# Patient Record
Sex: Female | Born: 1970 | Race: Black or African American | Hispanic: No | Marital: Married | Smoking: Former smoker
Health system: Southern US, Community
[De-identification: ages and names within clinical notes are randomized; demographics above are authoritative.]

## PROBLEM LIST (undated history)

## (undated) ENCOUNTER — Ambulatory Visit: Admission: EM | Payer: BLUE CROSS/BLUE SHIELD | Source: Home / Self Care

## (undated) DIAGNOSIS — I2510 Atherosclerotic heart disease of native coronary artery without angina pectoris: Secondary | ICD-10-CM

## (undated) DIAGNOSIS — I219 Acute myocardial infarction, unspecified: Secondary | ICD-10-CM

## (undated) DIAGNOSIS — E119 Type 2 diabetes mellitus without complications: Secondary | ICD-10-CM

## (undated) DIAGNOSIS — E785 Hyperlipidemia, unspecified: Secondary | ICD-10-CM

## (undated) DIAGNOSIS — G629 Polyneuropathy, unspecified: Secondary | ICD-10-CM

## (undated) DIAGNOSIS — N182 Chronic kidney disease, stage 2 (mild): Secondary | ICD-10-CM

## (undated) DIAGNOSIS — G4700 Insomnia, unspecified: Secondary | ICD-10-CM

## (undated) DIAGNOSIS — I10 Essential (primary) hypertension: Secondary | ICD-10-CM

## (undated) DIAGNOSIS — I509 Heart failure, unspecified: Secondary | ICD-10-CM

## (undated) DIAGNOSIS — T883XXA Malignant hyperthermia due to anesthesia, initial encounter: Secondary | ICD-10-CM

## (undated) DIAGNOSIS — R748 Abnormal levels of other serum enzymes: Secondary | ICD-10-CM

## (undated) HISTORY — DX: Hyperlipidemia, unspecified: E78.5

## (undated) HISTORY — DX: Acute myocardial infarction, unspecified: I21.9

## (undated) HISTORY — PX: BACK SURGERY: SHX140

## (undated) HISTORY — PX: CORONARY ANGIOPLASTY WITH STENT PLACEMENT: SHX49

## (undated) HISTORY — PX: OTHER SURGICAL HISTORY: SHX169

## (undated) HISTORY — PX: APPENDECTOMY: SHX54

## (undated) HISTORY — PX: CORONARY STENT PLACEMENT: SHX1402

## (undated) HISTORY — PX: MULTIPLE TOOTH EXTRACTIONS: SHX2053

## (undated) HISTORY — PX: ROTATOR CUFF REPAIR: SHX139

---

## 2015-07-07 DIAGNOSIS — Z955 Presence of coronary angioplasty implant and graft: Secondary | ICD-10-CM | POA: Insufficient documentation

## 2015-11-04 DIAGNOSIS — G5622 Lesion of ulnar nerve, left upper limb: Secondary | ICD-10-CM | POA: Insufficient documentation

## 2015-11-04 DIAGNOSIS — G5603 Carpal tunnel syndrome, bilateral upper limbs: Secondary | ICD-10-CM | POA: Insufficient documentation

## 2015-12-01 DIAGNOSIS — M5020 Other cervical disc displacement, unspecified cervical region: Secondary | ICD-10-CM | POA: Insufficient documentation

## 2015-12-01 DIAGNOSIS — M4802 Spinal stenosis, cervical region: Secondary | ICD-10-CM | POA: Insufficient documentation

## 2016-07-19 DIAGNOSIS — K219 Gastro-esophageal reflux disease without esophagitis: Secondary | ICD-10-CM | POA: Insufficient documentation

## 2016-10-08 DIAGNOSIS — M7501 Adhesive capsulitis of right shoulder: Secondary | ICD-10-CM | POA: Insufficient documentation

## 2016-11-11 DIAGNOSIS — M5030 Other cervical disc degeneration, unspecified cervical region: Secondary | ICD-10-CM

## 2016-11-11 HISTORY — DX: Other cervical disc degeneration, unspecified cervical region: M50.30

## 2017-03-16 DIAGNOSIS — I509 Heart failure, unspecified: Secondary | ICD-10-CM | POA: Insufficient documentation

## 2017-03-18 DIAGNOSIS — E113293 Type 2 diabetes mellitus with mild nonproliferative diabetic retinopathy without macular edema, bilateral: Secondary | ICD-10-CM | POA: Insufficient documentation

## 2017-05-30 DIAGNOSIS — G8929 Other chronic pain: Secondary | ICD-10-CM | POA: Insufficient documentation

## 2017-07-21 DIAGNOSIS — G4733 Obstructive sleep apnea (adult) (pediatric): Secondary | ICD-10-CM | POA: Insufficient documentation

## 2017-07-21 DIAGNOSIS — Z8249 Family history of ischemic heart disease and other diseases of the circulatory system: Secondary | ICD-10-CM | POA: Insufficient documentation

## 2017-08-15 DIAGNOSIS — M5412 Radiculopathy, cervical region: Secondary | ICD-10-CM

## 2017-08-15 HISTORY — DX: Radiculopathy, cervical region: M54.12

## 2017-10-12 DIAGNOSIS — E66812 Obesity, class 2: Secondary | ICD-10-CM | POA: Insufficient documentation

## 2017-10-12 DIAGNOSIS — Z9641 Presence of insulin pump (external) (internal): Secondary | ICD-10-CM | POA: Insufficient documentation

## 2018-02-02 DIAGNOSIS — F418 Other specified anxiety disorders: Secondary | ICD-10-CM | POA: Insufficient documentation

## 2018-02-02 DIAGNOSIS — F5101 Primary insomnia: Secondary | ICD-10-CM | POA: Insufficient documentation

## 2019-03-06 DIAGNOSIS — D259 Leiomyoma of uterus, unspecified: Secondary | ICD-10-CM | POA: Insufficient documentation

## 2019-06-21 DIAGNOSIS — M5416 Radiculopathy, lumbar region: Secondary | ICD-10-CM | POA: Insufficient documentation

## 2019-06-21 HISTORY — DX: Radiculopathy, lumbar region: M54.16

## 2020-01-01 DIAGNOSIS — L409 Psoriasis, unspecified: Secondary | ICD-10-CM | POA: Insufficient documentation

## 2020-11-25 DIAGNOSIS — F411 Generalized anxiety disorder: Secondary | ICD-10-CM | POA: Insufficient documentation

## 2020-11-25 DIAGNOSIS — G8929 Other chronic pain: Secondary | ICD-10-CM | POA: Insufficient documentation

## 2020-12-03 DIAGNOSIS — E559 Vitamin D deficiency, unspecified: Secondary | ICD-10-CM | POA: Insufficient documentation

## 2020-12-03 DIAGNOSIS — E538 Deficiency of other specified B group vitamins: Secondary | ICD-10-CM | POA: Insufficient documentation

## 2020-12-05 ENCOUNTER — Emergency Department (HOSPITAL_BASED_OUTPATIENT_CLINIC_OR_DEPARTMENT_OTHER): Payer: BLUE CROSS/BLUE SHIELD

## 2020-12-05 ENCOUNTER — Other Ambulatory Visit: Payer: Self-pay

## 2020-12-05 ENCOUNTER — Emergency Department (HOSPITAL_BASED_OUTPATIENT_CLINIC_OR_DEPARTMENT_OTHER)
Admission: EM | Admit: 2020-12-05 | Discharge: 2020-12-06 | Disposition: A | Discharge status: Home / Self Care | Payer: BLUE CROSS/BLUE SHIELD | Attending: Emergency Medicine | Admitting: Emergency Medicine

## 2020-12-05 ENCOUNTER — Encounter (HOSPITAL_BASED_OUTPATIENT_CLINIC_OR_DEPARTMENT_OTHER): Payer: Self-pay | Admitting: *Deleted

## 2020-12-05 DIAGNOSIS — Z87891 Personal history of nicotine dependence: Secondary | ICD-10-CM | POA: Diagnosis not present

## 2020-12-05 DIAGNOSIS — R197 Diarrhea, unspecified: Secondary | ICD-10-CM | POA: Insufficient documentation

## 2020-12-05 DIAGNOSIS — I2510 Atherosclerotic heart disease of native coronary artery without angina pectoris: Secondary | ICD-10-CM | POA: Insufficient documentation

## 2020-12-05 DIAGNOSIS — R1032 Left lower quadrant pain: Secondary | ICD-10-CM | POA: Insufficient documentation

## 2020-12-05 DIAGNOSIS — E119 Type 2 diabetes mellitus without complications: Secondary | ICD-10-CM | POA: Diagnosis not present

## 2020-12-05 DIAGNOSIS — R630 Anorexia: Secondary | ICD-10-CM | POA: Diagnosis not present

## 2020-12-05 DIAGNOSIS — R110 Nausea: Secondary | ICD-10-CM | POA: Diagnosis not present

## 2020-12-05 DIAGNOSIS — I10 Essential (primary) hypertension: Secondary | ICD-10-CM | POA: Insufficient documentation

## 2020-12-05 DIAGNOSIS — K529 Noninfective gastroenteritis and colitis, unspecified: Secondary | ICD-10-CM

## 2020-12-05 HISTORY — DX: Atherosclerotic heart disease of native coronary artery without angina pectoris: I25.10

## 2020-12-05 HISTORY — DX: Polyneuropathy, unspecified: G62.9

## 2020-12-05 HISTORY — DX: Type 2 diabetes mellitus without complications: E11.9

## 2020-12-05 HISTORY — DX: Essential (primary) hypertension: I10

## 2020-12-05 LAB — CBC WITH DIFFERENTIAL/PLATELET
Abs Immature Granulocytes: 0.07 10*3/uL (ref 0.00–0.07)
Basophils Absolute: 0.1 10*3/uL (ref 0.0–0.1)
Basophils Relative: 1 %
Eosinophils Absolute: 0.1 10*3/uL (ref 0.0–0.5)
Eosinophils Relative: 2 %
HCT: 41.1 % (ref 36.0–46.0)
Hemoglobin: 13.9 g/dL (ref 12.0–15.0)
Immature Granulocytes: 1 %
Lymphocytes Relative: 26 %
Lymphs Abs: 2.5 10*3/uL (ref 0.7–4.0)
MCH: 27.6 pg (ref 26.0–34.0)
MCHC: 33.8 g/dL (ref 30.0–36.0)
MCV: 81.7 fL (ref 80.0–100.0)
Monocytes Absolute: 0.6 10*3/uL (ref 0.1–1.0)
Monocytes Relative: 6 %
Neutro Abs: 6.3 10*3/uL (ref 1.7–7.7)
Neutrophils Relative %: 64 %
Platelets: 225 10*3/uL (ref 150–400)
RBC: 5.03 MIL/uL (ref 3.87–5.11)
RDW: 14.2 % (ref 11.5–15.5)
WBC: 9.6 10*3/uL (ref 4.0–10.5)
nRBC: 0 % (ref 0.0–0.2)

## 2020-12-05 LAB — COMPREHENSIVE METABOLIC PANEL
ALT: 40 U/L (ref 0–44)
AST: 51 U/L — ABNORMAL HIGH (ref 15–41)
Albumin: 3.7 g/dL (ref 3.5–5.0)
Alkaline Phosphatase: 61 U/L (ref 38–126)
Anion gap: 8 (ref 5–15)
BUN: 16 mg/dL (ref 6–20)
CO2: 25 mmol/L (ref 22–32)
Calcium: 9.1 mg/dL (ref 8.9–10.3)
Chloride: 102 mmol/L (ref 98–111)
Creatinine, Ser: 1.33 mg/dL — ABNORMAL HIGH (ref 0.44–1.00)
GFR, Estimated: 48 mL/min — ABNORMAL LOW (ref >60)
Glucose, Bld: 190 mg/dL — ABNORMAL HIGH (ref 70–99)
Potassium: 3.6 mmol/L (ref 3.5–5.1)
Sodium: 135 mmol/L (ref 135–145)
Total Bilirubin: 0.4 mg/dL (ref 0.3–1.2)
Total Protein: 7.4 g/dL (ref 6.5–8.1)

## 2020-12-05 LAB — I-STAT VENOUS BLOOD GAS, ED
Acid-Base Excess: 3 mmol/L — ABNORMAL HIGH (ref 0.0–2.0)
Bicarbonate: 28.7 mmol/L — ABNORMAL HIGH (ref 20.0–28.0)
Calcium, Ion: 1.21 mmol/L (ref 1.15–1.40)
HCT: 42 % (ref 36.0–46.0)
Hemoglobin: 14.3 g/dL (ref 12.0–15.0)
O2 Saturation: 34 %
Patient temperature: 98.1
Potassium: 5.3 mmol/L — ABNORMAL HIGH (ref 3.5–5.1)
Sodium: 134 mmol/L — ABNORMAL LOW (ref 135–145)
TCO2: 30 mmol/L (ref 22–32)
pCO2, Ven: 46.9 mmHg (ref 44.0–60.0)
pH, Ven: 7.394 (ref 7.250–7.430)
pO2, Ven: 21 mmHg — CL (ref 32.0–45.0)

## 2020-12-05 LAB — CBG MONITORING, ED: Glucose-Capillary: 210 mg/dL — ABNORMAL HIGH (ref 70–99)

## 2020-12-05 MED ORDER — LACTATED RINGERS IV BOLUS
1000.0000 mL | Freq: Once | INTRAVENOUS | Status: AC
  Administered 2020-12-05: 1000 mL via INTRAVENOUS

## 2020-12-05 MED ORDER — IOHEXOL 300 MG/ML  SOLN
100.0000 mL | Freq: Once | INTRAMUSCULAR | Status: AC | PRN
  Administered 2020-12-05: 100 mL via INTRAVENOUS

## 2020-12-05 MED ORDER — ONDANSETRON HCL 4 MG/2ML IJ SOLN
4.0000 mg | Freq: Once | INTRAMUSCULAR | Status: AC
  Administered 2020-12-05: 4 mg via INTRAVENOUS
  Filled 2020-12-05: qty 2

## 2020-12-05 MED ORDER — MORPHINE SULFATE (PF) 4 MG/ML IV SOLN
4.0000 mg | Freq: Once | INTRAVENOUS | Status: AC
Start: 2020-12-05 — End: 2020-12-05
  Administered 2020-12-05: 4 mg via INTRAVENOUS
  Filled 2020-12-05: qty 1

## 2020-12-05 NOTE — ED Triage Notes (Addendum)
C/o abd  cramping and pain and diarrhea  after starting sulfa abx wed. Blood sugar is elevated . Pt reports 278

## 2020-12-05 NOTE — ED Provider Notes (Signed)
[facility] Provider Note   CSN: [id number] Arrival date & time: 12/05/20  1953     History Chief Complaint  Patient presents with   Abdominal Pain    Jean Calderon is a 50 y.o. female.  The history is provided by the patient.  Abdominal Pain Pain location:  LLQ Pain quality: aching, cramping and gnawing   Pain radiates to:  Does not radiate Pain severity:  Severe Onset quality:  Gradual Duration:  12 hours Timing:  Constant Progression:  Unchanged Chronicity:  New Context comment:  Patient reports on Wednesday she started taking Bactrim, metronidazole and Diflucan and today around noon she started having left lower quadrant abdominal pain and multiple episodes of diarrhea Relieved by:  None tried Worsened by:  Eating Ineffective treatments:  None tried Associated symptoms: anorexia, diarrhea and nausea   Associated symptoms: no cough, no dysuria, no fever, no shortness of breath, no vaginal discharge and no vomiting   Risk factors comment:  Was recently started on 2 antibiotics and Diflucan.  Prior history of diabetes and DKA     Past Medical History:  Diagnosis Date   CAD (coronary artery disease)    DM (diabetes mellitus) ([place])    HTN (hypertension)    Neuropathy     There are no problems to display for this patient.   Past Surgical History:  Procedure Laterality Date   APPENDECTOMY     BACK SURGERY     CESAREAN SECTION     CORONARY ANGIOPLASTY WITH STENT PLACEMENT     ROTATOR CUFF REPAIR       OB History   No obstetric history on file.     No family history on file.  Social History   Tobacco Use   Smoking status: Former    Types: Cigarettes  Substance Use Topics   Alcohol use: Not Currently    Home Medications Prior to Admission medications   Not on File    Allergies    Nsaids and Ibuprofen  Review of Systems   Review of Systems  Constitutional:  Negative for fever.  Respiratory:  Negative for cough  and shortness of breath.   Gastrointestinal:  Positive for abdominal pain, anorexia, diarrhea and nausea. Negative for vomiting.  Genitourinary:  Negative for dysuria and vaginal discharge.  All other systems reviewed and are negative.  Physical Exam Updated Vital Signs BP (!) 116/59   Pulse 93   Temp 98.6 F (37 C) (Oral)   Resp 16   Ht 5\' 9"  (1.753 m)   Wt 131.1 kg   SpO2 99%   BMI 42.68 kg/m   Physical Exam Vitals and nursing note reviewed.  Constitutional:      General: She is not in acute distress.    Appearance: She is well-developed.  HENT:     Head: Normocephalic and atraumatic.     Mouth/Throat:     Mouth: Mucous membranes are dry.  Eyes:     Pupils: Pupils are equal, round, and reactive to light.  Cardiovascular:     Rate and Rhythm: Normal rate and regular rhythm.     Heart sounds: Normal heart sounds. No murmur heard.   No friction rub.  Pulmonary:     Effort: Pulmonary effort is normal.     Breath sounds: Normal breath sounds. No wheezing or rales.  Abdominal:     General: Bowel sounds are normal. There is no distension.     Palpations: Abdomen is soft.  Tenderness: There is abdominal tenderness in the left lower quadrant. There is guarding. There is no right CVA tenderness, left CVA tenderness or rebound.  Musculoskeletal:        General: No tenderness. Normal range of motion.     Cervical back: Normal range of motion and neck supple.     Right lower leg: No edema.     Left lower leg: No edema.     Comments: No edema  Skin:    General: Skin is warm and dry.     Findings: No rash.  Neurological:     General: No focal deficit present.     Mental Status: She is alert and oriented to person, place, and time. Mental status is at baseline.     Cranial Nerves: No cranial nerve deficit.  Psychiatric:        Mood and Affect: Mood normal.        Behavior: Behavior normal.    ED Results / Procedures / Treatments   Labs (all labs ordered are listed,  but only abnormal results are displayed) Labs Reviewed  CBC WITH DIFFERENTIAL/PLATELET  COMPREHENSIVE METABOLIC PANEL  I-STAT VENOUS BLOOD GAS, ED    EKG None  Radiology No results found.  Procedures Procedures   Medications Ordered in ED Medications  lactated ringers bolus 1,000 mL (has no administration in time range)  ondansetron (ZOFRAN) injection 4 mg (has no administration in time range)  morphine 4 MG/ML injection 4 mg (has no administration in time range)    ED Course  I have reviewed the triage vital signs and the nursing notes.  Pertinent labs & imaging results that were available during my care of the patient were reviewed by me and considered in my medical decision making (see chart for details).    MDM Rules/Calculators/A&P                           Patient is a diabetic female with history of hypertension and CAD who is presenting today with left lower quadrant pain, multiple episodes of diarrhea and nausea.  She did start Bactrim, metronidazole and Diflucan on Wednesday which she reported was for a vaginal infection but unclear what the Bactrim was for specifically as it was not mentioned in her doctor's note.  She denies having any urinary symptoms and reported she did not have vaginal discharge at the time.  She has had poor oral intake since noon given her symptoms and reports her blood sugar has been in the 400s.  She has been in DKA in the past and was concerned that that is what was happening.  No recent abdominal surgeries.  She did have shoulder surgery 1 month ago and reports that has been slow to heal but no fever, cough, congestion or new shortness of breath.  Patient has stable vital signs on exam.  She does have left lower quadrant pain and concern for possible diverticulitis versus colitis and dehydration.  Also concern for possible electrolyte abnormality.  Patient given IV fluids.  Labs and imaging are pending.  Final Clinical Impression(s) / ED  Diagnoses Final diagnoses:  None    Rx / DC Orders ED Discharge Orders     None        Blanchie Dessert, MD 12/05/20 2322

## 2020-12-05 NOTE — ED Notes (Signed)
Multible efforts to establish IV access by 3 different people w/o success. Dr. Theora Gianotti aware.

## 2020-12-05 NOTE — ED Provider Notes (Signed)
Nursing notes and vitals signs, including pulse oximetry, reviewed.  Summary of this visit's results, reviewed by myself:  EKG:  EKG Interpretation  Date/Time:    Ventricular Rate:    PR Interval:    QRS Duration:   QT Interval:    QTC Calculation:   R Axis:     Text Interpretation:          Labs:  Results for orders placed or performed during the hospital encounter of 12/05/20 (from the past 24 hour(s))  CBC with Differential/Platelet     Status: None   Collection Time: 12/05/20  8:58 PM  Result Value Ref Range   WBC 9.6 4.0 - 10.5 K/uL   RBC 5.03 3.87 - 5.11 MIL/uL   Hemoglobin 13.9 12.0 - 15.0 g/dL   HCT 41.1 36.0 - 46.0 %   MCV 81.7 80.0 - 100.0 fL   MCH 27.6 26.0 - 34.0 pg   MCHC 33.8 30.0 - 36.0 g/dL   RDW 14.2 11.5 - 15.5 %   Platelets 225 150 - 400 K/uL   nRBC 0.0 0.0 - 0.2 %   Neutrophils Relative % 64 %   Neutro Abs 6.3 1.7 - 7.7 K/uL   Lymphocytes Relative 26 %   Lymphs Abs 2.5 0.7 - 4.0 K/uL   Monocytes Relative 6 %   Monocytes Absolute 0.6 0.1 - 1.0 K/uL   Eosinophils Relative 2 %   Eosinophils Absolute 0.1 0.0 - 0.5 K/uL   Basophils Relative 1 %   Basophils Absolute 0.1 0.0 - 0.1 K/uL   Immature Granulocytes 1 %   Abs Immature Granulocytes 0.07 0.00 - 0.07 K/uL  Comprehensive metabolic panel     Status: Abnormal   Collection Time: 12/05/20  8:58 PM  Result Value Ref Range   Sodium 135 135 - 145 mmol/L   Potassium 3.6 3.5 - 5.1 mmol/L   Chloride 102 98 - 111 mmol/L   CO2 25 22 - 32 mmol/L   Glucose, Bld 190 (H) 70 - 99 mg/dL   BUN 16 6 - 20 mg/dL   Creatinine, Ser 1.33 (H) 0.44 - 1.00 mg/dL   Calcium 9.1 8.9 - 10.3 mg/dL   Total Protein 7.4 6.5 - 8.1 g/dL   Albumin 3.7 3.5 - 5.0 g/dL   AST 51 (H) 15 - 41 U/L   ALT 40 0 - 44 U/L   Alkaline Phosphatase 61 38 - 126 U/L   Total Bilirubin 0.4 0.3 - 1.2 mg/dL   GFR, Estimated 48 (L) >60 mL/min   Anion gap 8 5 - 15  CBG monitoring, ED     Status: Abnormal   Collection Time: 12/05/20  9:30 PM   Result Value Ref Range   Glucose-Capillary 210 (H) 70 - 99 mg/dL  I-Stat venous blood gas, [facility] ED)     Status: Abnormal   Collection Time: 12/05/20 10:02 PM  Result Value Ref Range   pH, Ven 7.394 7.250 - 7.430   pCO2, Ven 46.9 44.0 - 60.0 mmHg   pO2, Ven 21.0 (LL) 32.0 - 45.0 mmHg   Bicarbonate 28.7 (H) 20.0 - 28.0 mmol/L   TCO2 30 22 - 32 mmol/L   O2 Saturation 34.0 %   Acid-Base Excess 3.0 (H) 0.0 - 2.0 mmol/L   Sodium 134 (L) 135 - 145 mmol/L   Potassium 5.3 (H) 3.5 - 5.1 mmol/L   Calcium, Ion 1.21 1.15 - 1.40 mmol/L   HCT 42.0 36.0 - 46.0 %   Hemoglobin 14.3 12.0 -  15.0 g/dL   Patient temperature 98.1 F    Sample type VENOUS    Comment NOTIFIED PHYSICIAN   Urinalysis, Routine w reflex microscopic Urine, Clean Catch     Status: Abnormal   Collection Time: 12/05/20 11:57 PM  Result Value Ref Range   Color, Urine YELLOW YELLOW   APPearance CLEAR CLEAR   Specific Gravity, Urine 1.010 1.005 - 1.030   pH 6.0 5.0 - 8.0   Glucose, UA >=500 (A) NEGATIVE mg/dL   Hgb urine dipstick NEGATIVE NEGATIVE   Bilirubin Urine NEGATIVE NEGATIVE   Ketones, ur NEGATIVE NEGATIVE mg/dL   Protein, ur NEGATIVE NEGATIVE mg/dL   Nitrite NEGATIVE NEGATIVE   Leukocytes,Ua NEGATIVE NEGATIVE  Urinalysis, Microscopic (reflex)     Status: Abnormal   Collection Time: 12/05/20 11:57 PM  Result Value Ref Range   RBC / HPF 0-5 0 - 5 RBC/hpf   WBC, UA 0-5 0 - 5 WBC/hpf   Bacteria, UA RARE (A) NONE SEEN   Squamous Epithelial / LPF 0-5 0 - 5    Imaging Studies: CT ABDOMEN PELVIS W CONTRAST  Result Date: 12/05/2020 CLINICAL DATA:  Diverticulitis suspected. Abdominal pain and cramping and diarrhea after starting antibiotics. EXAM: CT ABDOMEN AND PELVIS WITH CONTRAST TECHNIQUE: Multidetector CT imaging of the abdomen and pelvis was performed using the standard protocol following bolus administration of intravenous contrast. CONTRAST:  189mL OMNIPAQUE IOHEXOL 300 MG/ML  SOLN COMPARISON:  None. FINDINGS: Lower  chest: The lung bases are clear. Hepatobiliary: Diffuse fatty infiltration of the liver. No focal liver lesions. Gallbladder and bile ducts are unremarkable. Pancreas: Unremarkable. No pancreatic ductal dilatation or surrounding inflammatory changes. Spleen: Normal in size without focal abnormality. Adrenals/Urinary Tract: Adrenal glands are unremarkable. Kidneys are normal, without renal calculi, focal lesion, or hydronephrosis. Bladder is unremarkable. Stomach/Bowel: Stomach, small bowel, and colon are not abnormally distended. Under distention limits evaluation but there appears to be wall thickening of the transverse colon. Although this could be artifact of under distension, this could indicate a focal colitis. No abscess or collection is identified. No significant diverticular disease. Appendix is surgically absent. Vascular/Lymphatic: No significant vascular findings are present. No enlarged abdominal or pelvic lymph nodes. Reproductive: Uterus and ovaries are not enlarged. Other: No free air or free fluid in the abdomen. Abdominal wall musculature appears intact. Musculoskeletal: Postoperative changes with posterior fixation of L4-5. Normal alignment of the lumbar spine. No destructive bone lesions. IMPRESSION: 1. Wall thickening suggested in the transverse colon possibly indicating focal colitis. Follow-up after resolution of acute process is recommended to exclude underlying neoplastic lesion. No evidence of obstruction. 2. Diffuse fatty infiltration of the liver. 3. Postoperative changes in the lower lumbar spine. Electronically Signed   By: Lucienne Capers M.D.   On: 12/05/2020 23:40    The patient's urinalysis is normal and is unclear why she was placed on Bactrim.  We will have her discontinue the Bactrim.  We will have her continue the Flagyl and add Cipro for possible colitis.  She did have a colonoscopy last year which she states was normal.     Kolter Reaver, Jenny Reichmann, MD 12/06/20 [phone]

## 2020-12-06 LAB — URINALYSIS, ROUTINE W REFLEX MICROSCOPIC
Bilirubin Urine: NEGATIVE
Glucose, UA: >=500 mg/dL — AB
Hgb urine dipstick: NEGATIVE
Ketones, ur: NEGATIVE mg/dL
Leukocytes,Ua: NEGATIVE
Nitrite: NEGATIVE
Protein, ur: NEGATIVE mg/dL
Specific Gravity, Urine: 1.01 (ref 1.005–1.030)
pH: 6 (ref 5.0–8.0)

## 2020-12-06 LAB — URINALYSIS, MICROSCOPIC (REFLEX)

## 2020-12-06 MED ORDER — HYDROCODONE-ACETAMINOPHEN 5-325 MG PO TABS: 1.0000 | ORAL_TABLET | Freq: Four times a day (QID) | ORAL | 0 refills | Status: DC | PRN

## 2020-12-06 MED ORDER — METRONIDAZOLE 500 MG PO TABS: 500.0000 mg | ORAL_TABLET | Freq: Three times a day (TID) | ORAL | 0 refills | Status: DC

## 2020-12-06 MED ORDER — CIPROFLOXACIN HCL 500 MG PO TABS: 500.0000 mg | ORAL_TABLET | Freq: Two times a day (BID) | ORAL | 0 refills | Status: DC

## 2020-12-06 NOTE — Discharge Instructions (Addendum)
Please discontinue your Bactrim/Septra (trimethoprim/sulfamethoxazole).  We will have you continue Flagyl although in a slightly different dosing schedule.  We will also add Cipro.  These are for the treatment of colitis, a possible infection of your colon.  If you worsen please contact your PCP or return to the emergency department.

## 2020-12-19 ENCOUNTER — Emergency Department (HOSPITAL_BASED_OUTPATIENT_CLINIC_OR_DEPARTMENT_OTHER)
Admission: EM | Admit: 2020-12-19 | Discharge: 2020-12-19 | Disposition: A | Discharge status: Home / Self Care | Payer: BLUE CROSS/BLUE SHIELD | Attending: Emergency Medicine | Admitting: Emergency Medicine

## 2020-12-19 ENCOUNTER — Other Ambulatory Visit: Payer: Self-pay

## 2020-12-19 ENCOUNTER — Emergency Department (HOSPITAL_BASED_OUTPATIENT_CLINIC_OR_DEPARTMENT_OTHER): Payer: BLUE CROSS/BLUE SHIELD

## 2020-12-19 ENCOUNTER — Encounter (HOSPITAL_BASED_OUTPATIENT_CLINIC_OR_DEPARTMENT_OTHER): Payer: Self-pay

## 2020-12-19 DIAGNOSIS — I2510 Atherosclerotic heart disease of native coronary artery without angina pectoris: Secondary | ICD-10-CM | POA: Diagnosis not present

## 2020-12-19 DIAGNOSIS — Z7902 Long term (current) use of antithrombotics/antiplatelets: Secondary | ICD-10-CM | POA: Insufficient documentation

## 2020-12-19 DIAGNOSIS — Z794 Long term (current) use of insulin: Secondary | ICD-10-CM | POA: Diagnosis not present

## 2020-12-19 DIAGNOSIS — Z87891 Personal history of nicotine dependence: Secondary | ICD-10-CM | POA: Diagnosis not present

## 2020-12-19 DIAGNOSIS — M25512 Pain in left shoulder: Secondary | ICD-10-CM | POA: Diagnosis present

## 2020-12-19 DIAGNOSIS — Z951 Presence of aortocoronary bypass graft: Secondary | ICD-10-CM | POA: Diagnosis not present

## 2020-12-19 DIAGNOSIS — E1140 Type 2 diabetes mellitus with diabetic neuropathy, unspecified: Secondary | ICD-10-CM | POA: Diagnosis not present

## 2020-12-19 DIAGNOSIS — I10 Essential (primary) hypertension: Secondary | ICD-10-CM | POA: Insufficient documentation

## 2020-12-19 DIAGNOSIS — Z79899 Other long term (current) drug therapy: Secondary | ICD-10-CM | POA: Insufficient documentation

## 2020-12-19 MED ORDER — METHOCARBAMOL 500 MG PO TABS: 500.0000 mg | ORAL_TABLET | Freq: Two times a day (BID) | ORAL | 0 refills | Status: DC

## 2020-12-19 NOTE — ED Triage Notes (Signed)
Pt c/o left shoulder pain-states she ha surgery to left shoulder ~57month ago in [place]-seen by local ortho 2 days ago-NAD-steady gait

## 2020-12-19 NOTE — ED Provider Notes (Signed)
[facility] Provider Note   CSN: [id number] Arrival date & time: 12/19/20  1509     History Chief Complaint  Patient presents with   Shoulder Pain    Jean Calderon is a 50 y.o. female.  HPI Patient is a 50 year old female with past medical history significant for CAD, DM2, HTN, neuropathy  Patient is presented to the ER today she states that she had a left shoulder bone spur removal 1 month ago for shoulder pain that has been ongoing for some time.  She states that she has had pain since the surgery but felt that 1 week ago her pain was worse.  She was seen by her orthopedist and told that this was expected she came to the ER due to continued pain.  She is currently taking Flexeril and Tylenol for pain  She states that the pain is achy constant 10/10 worse with movement of her shoulder and when she reaches overhead.  Denies any fevers chills rashes lightheadedness dizziness cough congestion no chest pain or shortness of breath no nausea or vomiting.     Past Medical History:  Diagnosis Date   CAD (coronary artery disease)    DM (diabetes mellitus) ([place])    HTN (hypertension)    Neuropathy     There are no problems to display for this patient.   Past Surgical History:  Procedure Laterality Date   APPENDECTOMY     BACK SURGERY     CESAREAN SECTION     CORONARY ANGIOPLASTY WITH STENT PLACEMENT     ROTATOR CUFF REPAIR       OB History   No obstetric history on file.     No family history on file.  Social History   Tobacco Use   Smoking status: Former    Types: Cigarettes   Smokeless tobacco: Never  Vaping Use   Vaping Use: Never used  Substance Use Topics   Alcohol use: Not Currently    Home Medications Prior to Admission medications   Medication Sig Start Date End Date Taking? Authorizing Provider  atorvastatin (LIPITOR) 40 MG tablet Take 1 tablet by mouth daily. 09/01/20  Yes [provider]  Calcium  Carbonate-Vitamin D (OYSTER SHELL CALCIUM/D) 500-5 MG-MCG TABS Take by mouth. 12/03/20  Yes [provider]  hydrOXYzine (ATARAX/VISTARIL) 10 MG tablet Take one by mouth once daily at bedtime as needed for itching 07/16/20  Yes [provider]  insulin lispro (HUMALOG) 100 UNIT/ML injection Take 200 units Via insulin pump every 3 days 10/11/19  Yes [provider]  isosorbide mononitrate (IMDUR) 30 MG 24 hr tablet Take 1 tablet by mouth daily. 11/25/20  Yes [provider]  lisinopril-hydrochlorothiazide (ZESTORETIC) 20-12.5 MG tablet Take 1 tablet by mouth daily. 11/25/20  Yes [provider]  LORazepam (ATIVAN) 0.5 MG tablet Take by mouth. 06/18/20  Yes [provider]  methocarbamol (ROBAXIN) 500 MG tablet Take 1 tablet (500 mg total) by mouth 2 (two) times daily. 12/19/20  Yes Oluwadamilola Deliz S, PA  tizanidine (ZANAFLEX) 6 MG capsule Take by mouth. 12/03/20  Yes [provider]  traMADol (ULTRAM) 50 MG tablet Take by mouth. 11/20/20  Yes [provider]  venlafaxine XR (EFFEXOR-XR) 75 MG 24 hr capsule Take by mouth. 06/25/20  Yes [provider]  albuterol (VENTOLIN HFA) 108 (90 Base) MCG/ACT inhaler SMARTSIG:2 Puff(s) By Mouth Every 4 Hours PRN 10/01/20   [provider]  buPROPion (WELLBUTRIN XL) 150 MG 24 hr  tablet Take 150 mg by mouth daily. 12/04/20   [provider]  ciprofloxacin (CIPRO) 500 MG tablet Take 1 tablet (500 mg total) by mouth 2 (two) times daily. One po bid x 7 days 12/06/20   Molpus, John, MD  clopidogrel (PLAVIX) 75 MG tablet Take 75 mg by mouth daily. 11/25/20   [provider]  cyclobenzaprine (FLEXERIL) 10 MG tablet Take 10 mg by mouth 3 (three) times daily as needed. 11/26/20   [provider]  gabapentin (NEURONTIN) 100 MG capsule Take by mouth. 08/05/20   [provider]  HYDROcodone-acetaminophen (NORCO) 5-325 MG tablet Take 1 tablet by mouth every 6 (six) hours  as needed for severe pain. 12/06/20   Molpus, John, MD  JARDIANCE 25 MG TABS tablet Take 25 mg by mouth daily. 12/14/20   [provider]  lisinopril-hydrochlorothiazide (ZESTORETIC) 20-12.5 MG tablet Take 1 tablet by mouth daily. 12/04/20   [provider]  metoprolol succinate (TOPROL-XL) 100 MG 24 hr tablet Take 100 mg by mouth daily. 12/04/20   [provider]  metroNIDAZOLE (FLAGYL) 500 MG tablet Take 1 tablet (500 mg total) by mouth 3 (three) times daily. One po bid x 7 days 12/06/20   Molpus, John, MD  oxyCODONE-acetaminophen (PERCOCET/ROXICET) 5-325 MG tablet Take by mouth. 10/16/20   [provider]  pregabalin (LYRICA) 150 MG capsule Take 150 mg by mouth 2 (two) times daily. 08/14/20   [provider]  promethazine-dextromethorphan (PROMETHAZINE-DM) 6.25-15 MG/5ML syrup Take 5 mLs by mouth every 6 (six) hours. 09/20/20   [provider]  topiramate (TOPAMAX) 25 MG tablet Take 25 mg by mouth 2 (two) times daily. 10/08/20   [provider]  zolpidem (AMBIEN) 5 MG tablet Take 5-10 mg by mouth at bedtime. 12/14/20   [provider]    Allergies    Nsaids and Ibuprofen  Review of Systems   Review of Systems  Constitutional:  Negative for chills and fever.  HENT:  Negative for congestion.   Eyes:  Negative for pain.  Respiratory:  Negative for cough and shortness of breath.   Cardiovascular:  Negative for chest pain and leg swelling.  Gastrointestinal:  Negative for abdominal pain and vomiting.  Genitourinary:  Negative for dysuria.  Musculoskeletal:  Negative for myalgias.       Left shoulder pain  Skin:  Negative for rash.  Neurological:  Negative for dizziness and headaches.   Physical Exam Updated Vital Signs BP 105/70 (BP Location: Right Arm)   Pulse 75   Temp 97.8 F (36.6 C) (Oral)   Resp 18   Ht 5\' 9"  (1.753 m)   Wt 125.2 kg   SpO2 99%   BMI 40.76 kg/m   Physical Exam Vitals and nursing note  reviewed.  Constitutional:      General: She is not in acute distress. HENT:     Head: Normocephalic and atraumatic.     Nose: Nose normal.  Eyes:     General: No scleral icterus. Cardiovascular:     Rate and Rhythm: Normal rate and regular rhythm.     Pulses: Normal pulses.     Heart sounds: Normal heart sounds.     Comments: Radial artery pulses 3+ and symmetric Pulmonary:     Effort: Pulmonary effort is normal. No respiratory distress.     Breath sounds: No wheezing.  Musculoskeletal:     Cervical back: Normal range of motion.     Right lower leg: No edema.  Left lower leg: No edema.     Comments: Tenderness to palpation of left deltoid there is a small well-healing scar with no evidence of infection consistent with arthroscopy  Full range of motion passively of left shoulder no redness or swelling of the left shoulder.  Passive range of motion to 90 degrees flexion and 90 degrees abduction however limited beyond this due to pain.  Skin:    General: Skin is warm and dry.     Capillary Refill: Capillary refill takes less than 2 seconds. Cap refill in fingers of left hand Neurological:     Mental Status: She is alert. Mental status is at baseline.     Comments: Sensation intact in all 4 extremities  Psychiatric:        Mood and Affect: Mood normal.        Behavior: Behavior normal.    ED Results / Procedures / Treatments   Labs (all labs ordered are listed, but only abnormal results are displayed) Labs Reviewed - No data to display  EKG None  Radiology DG Shoulder Left  Result Date: 12/19/2020 CLINICAL DATA:  Left shoulder pain, fell 1 week ago EXAM: LEFT SHOULDER - 2+ VIEW COMPARISON:  None. FINDINGS: Frontal, transscapular, and axillary views of the left shoulder are obtained. No fracture, subluxation, or dislocation. 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 Moderate hypertrophic changes are seen at the acromioclavicular joint. Visualized portions of the left  chest are clear. IMPRESSION: 1. Osteoarthritis.  No acute displaced fracture. Electronically Signed   By: Randa Ngo M.D.   On: 12/19/2020 16:42    Procedures Procedures   Medications Ordered in ED Medications - No data to display  ED Course  I have reviewed the triage vital signs and the nursing notes.  Pertinent labs & imaging results that were available during my care of the patient were reviewed by me and considered in my medical decision making (see chart for details).    MDM Rules/Calculators/A&P                          Patient is 50 year old female 1 month out since shoulder arthroscopy and bone spur removal has had ongoing pain seems a worse over the past week worse with movement reproduces pain with movement and palpation  Is distally neurovascularly intact has good pulses bilaterally in the wrists and good movement does not have any evidence of septic joint with good movement without pain up until 90 degrees of flexion and 90 degrees of abduction. No systemic symptoms no chest pain doubt ACS, septic joint, bursitis  Requesting analgesics here in the ER.  Seems that she has muscle relaxers at home she is using Flexeril does cause some sedation we will prescribe her Robaxin to use instead.  X-ray personally reviewed given that she did have a fall 2 weeks ago she states where she fell onto her left shoulder denies any other areas of pain.  X-ray reviewed and negative for fracture.  We will discharge patient home at this time with follow-up with her orthopedic surgeon.  Final Clinical Impression(s) / ED Diagnoses Final diagnoses:  Left shoulder pain, unspecified chronicity    Rx / DC Orders ED Discharge Orders          Ordered    methocarbamol (ROBAXIN) 500 MG tablet  2 times daily        12/19/20 1637             [place], [employer]  S, PA 12/19/20 [place], DO 12/19/20 2001

## 2020-12-19 NOTE — Discharge Instructions (Signed)
Your x-ray is negative for any fractures.  Please follow-up with your orthopedic doctor.  Please continue to do range of motion exercises you may try out the other muscle relaxer I have prescribed you instead of tizanidine  Please use Tylenol or ibuprofen for pain.  You may use 600 mg ibuprofen every 6 hours or 1000 mg of Tylenol every 6 hours.  You may choose to alternate between the 2.  This would be most effective.  Not to exceed 4 g of Tylenol within 24 hours.  Not to exceed 3200 mg ibuprofen 24 hours.   If you are unable to take ibuprofen you may apply topical Voltaren gel to skin instead.

## 2021-01-06 ENCOUNTER — Encounter (HOSPITAL_COMMUNITY): Payer: Self-pay | Admitting: Radiology

## 2021-01-06 DIAGNOSIS — I38 Endocarditis, valve unspecified: Secondary | ICD-10-CM | POA: Insufficient documentation

## 2021-01-14 ENCOUNTER — Emergency Department (HOSPITAL_BASED_OUTPATIENT_CLINIC_OR_DEPARTMENT_OTHER)
Admission: EM | Admit: 2021-01-14 | Discharge: 2021-01-14 | Disposition: A | Discharge status: Home / Self Care | Payer: BLUE CROSS/BLUE SHIELD | Attending: Emergency Medicine | Admitting: Emergency Medicine

## 2021-01-14 ENCOUNTER — Other Ambulatory Visit: Payer: Self-pay

## 2021-01-14 ENCOUNTER — Encounter (HOSPITAL_BASED_OUTPATIENT_CLINIC_OR_DEPARTMENT_OTHER): Payer: Self-pay | Admitting: Emergency Medicine

## 2021-01-14 ENCOUNTER — Emergency Department (HOSPITAL_BASED_OUTPATIENT_CLINIC_OR_DEPARTMENT_OTHER): Payer: BLUE CROSS/BLUE SHIELD

## 2021-01-14 DIAGNOSIS — Z7984 Long term (current) use of oral hypoglycemic drugs: Secondary | ICD-10-CM | POA: Diagnosis not present

## 2021-01-14 DIAGNOSIS — Z7902 Long term (current) use of antithrombotics/antiplatelets: Secondary | ICD-10-CM | POA: Insufficient documentation

## 2021-01-14 DIAGNOSIS — Z794 Long term (current) use of insulin: Secondary | ICD-10-CM | POA: Insufficient documentation

## 2021-01-14 DIAGNOSIS — I10 Essential (primary) hypertension: Secondary | ICD-10-CM | POA: Diagnosis not present

## 2021-01-14 DIAGNOSIS — R0789 Other chest pain: Secondary | ICD-10-CM | POA: Diagnosis not present

## 2021-01-14 DIAGNOSIS — Z87891 Personal history of nicotine dependence: Secondary | ICD-10-CM | POA: Insufficient documentation

## 2021-01-14 DIAGNOSIS — I2510 Atherosclerotic heart disease of native coronary artery without angina pectoris: Secondary | ICD-10-CM | POA: Diagnosis not present

## 2021-01-14 DIAGNOSIS — Z79899 Other long term (current) drug therapy: Secondary | ICD-10-CM | POA: Insufficient documentation

## 2021-01-14 DIAGNOSIS — E1165 Type 2 diabetes mellitus with hyperglycemia: Secondary | ICD-10-CM | POA: Insufficient documentation

## 2021-01-14 DIAGNOSIS — R739 Hyperglycemia, unspecified: Secondary | ICD-10-CM

## 2021-01-14 LAB — CBC
HCT: 44.7 % (ref 36.0–46.0)
Hemoglobin: 14.4 g/dL (ref 12.0–15.0)
MCH: 27.2 pg (ref 26.0–34.0)
MCHC: 32.2 g/dL (ref 30.0–36.0)
MCV: 84.3 fL (ref 80.0–100.0)
Platelets: 222 10*3/uL (ref 150–400)
RBC: 5.3 MIL/uL — ABNORMAL HIGH (ref 3.87–5.11)
RDW: 13.9 % (ref 11.5–15.5)
WBC: 7 10*3/uL (ref 4.0–10.5)
nRBC: 0 % (ref 0.0–0.2)

## 2021-01-14 LAB — URINALYSIS, ROUTINE W REFLEX MICROSCOPIC
Bilirubin Urine: NEGATIVE
Glucose, UA: >=500 mg/dL — AB
Hgb urine dipstick: NEGATIVE
Ketones, ur: NEGATIVE mg/dL
Leukocytes,Ua: NEGATIVE
Nitrite: NEGATIVE
Protein, ur: NEGATIVE mg/dL
Specific Gravity, Urine: 1.01 (ref 1.005–1.030)
pH: 5.5 (ref 5.0–8.0)

## 2021-01-14 LAB — URINALYSIS, MICROSCOPIC (REFLEX)

## 2021-01-14 LAB — PREGNANCY, URINE: Preg Test, Ur: NEGATIVE

## 2021-01-14 LAB — LIPASE, BLOOD: Lipase: 35 U/L (ref 11–51)

## 2021-01-14 LAB — CBG MONITORING, ED: Glucose-Capillary: 199 mg/dL — ABNORMAL HIGH (ref 70–99)

## 2021-01-14 LAB — BASIC METABOLIC PANEL
Anion gap: 11 (ref 5–15)
BUN: 15 mg/dL (ref 6–20)
CO2: 22 mmol/L (ref 22–32)
Calcium: 9.4 mg/dL (ref 8.9–10.3)
Chloride: 101 mmol/L (ref 98–111)
Creatinine, Ser: 1.03 mg/dL — ABNORMAL HIGH (ref 0.44–1.00)
GFR, Estimated: >60 mL/min (ref >60)
Glucose, Bld: 205 mg/dL — ABNORMAL HIGH (ref 70–99)
Potassium: 3.4 mmol/L — ABNORMAL LOW (ref 3.5–5.1)
Sodium: 134 mmol/L — ABNORMAL LOW (ref 135–145)

## 2021-01-14 LAB — TROPONIN I (HIGH SENSITIVITY)
Troponin I (High Sensitivity): 2 ng/L (ref <18)
Troponin I (High Sensitivity): 2 ng/L (ref <18)

## 2021-01-14 MED ORDER — POTASSIUM CHLORIDE CRYS ER 20 MEQ PO TBCR
20.0000 meq | EXTENDED_RELEASE_TABLET | Freq: Once | ORAL | Status: AC
  Administered 2021-01-14: 20 meq via ORAL
  Filled 2021-01-14: qty 1

## 2021-01-14 MED ORDER — DIPHENHYDRAMINE HCL 50 MG/ML IJ SOLN
12.5000 mg | Freq: Once | INTRAMUSCULAR | Status: AC
  Administered 2021-01-14: 12.5 mg via INTRAVENOUS
  Filled 2021-01-14: qty 1

## 2021-01-14 MED ORDER — LACTATED RINGERS IV BOLUS
1500.0000 mL | Freq: Once | INTRAVENOUS | Status: AC
  Administered 2021-01-14: 1500 mL via INTRAVENOUS

## 2021-01-14 NOTE — Discharge Instructions (Signed)
Please continue to monitor your symptoms closely.  If you develop any new or worsening symptoms please come back to the emergency department.  It was a pleasure to meet you.

## 2021-01-14 NOTE — ED Notes (Signed)
Attempted to draw repeat troponin off IV and straight stick but was unsuccessful

## 2021-01-14 NOTE — ED Provider Notes (Signed)
[facility] EMERGENC[facility] DEPARTMENT Provider Note   CSN: [id number] Arrival date & time: 01/14/21  2130     History Chief Complaint  Patient presents with   Chest Pain   Hyperglycemia   Arm Pain    Jean Calderon is a 50 y.o. female.  HPI  Patient is a 50 year old female with a history of diabetes mellitus on insulin pump, hypertension, CAD status post stenting, who presents to the emergency department due to fatigue, intermittent shortness of breath, as well as left-sided chest discomfort.  Symptoms started about 2 days ago.  States that her chest discomfort is a dull pain and is nonradiating.  States that it will typically last about 10 to 15 minutes and spontaneously resolved.  It sometimes improves with movement as well as when drinking ginger ale.  She reports associated decreased appetite.  No vomiting.  She states she saw her PCP yesterday and had a negative respiratory panel at that visit.  No abdominal pain.  Reports polyuria as well as polydipsia and states that her blood glucose has been running in the 300s at times.   Past Medical History:  Diagnosis Date   CAD (coronary artery disease)    DM (diabetes mellitus) ([place])    HTN (hypertension)    Neuropathy    There are no problems to display for this patient.   Past Surgical History:  Procedure Laterality Date   APPENDECTOMY     BACK SURGERY     CESAREAN SECTION     CORONARY ANGIOPLASTY WITH STENT PLACEMENT     ROTATOR CUFF REPAIR       OB History   No obstetric history on file.     No family history on file.  Social History   Tobacco Use   Smoking status: Former    Types: Cigarettes   Smokeless tobacco: Never  Vaping Use   Vaping Use: Never used  Substance Use Topics   Alcohol use: Not Currently    Home Medications Prior to Admission medications   Medication Sig Start Date End Date Taking? Authorizing Provider  albuterol (VENTOLIN HFA) 108 (90 Base) MCG/ACT inhaler SMARTSIG:2 Puff(s) By  Mouth Every 4 Hours PRN 10/01/20   [provider]  amoxicillin-clavulanate (AUGMENTIN) 875-125 MG tablet Take 1 tablet by mouth 2 (two) times daily. 01/13/21   [provider]  atorvastatin (LIPITOR) 40 MG tablet Take 1 tablet by mouth daily. 09/01/20   [provider]  buPROPion (WELLBUTRIN XL) 150 MG 24 hr tablet Take 150 mg by mouth daily. 12/04/20   [provider]  Calcium Carbonate-Vitamin D (OYSTER SHELL CALCIUM/D) 500-5 MG-MCG TABS Take by mouth. 12/03/20   [provider]  cefUROXime (CEFTIN) 500 MG tablet Take 500 mg by mouth 2 (two) times daily. 11/08/20   [provider]  Ciclopirox 0.77 % gel Apply topically 2 (two) times daily. 09/25/20   [provider]  cloNIDine (CATAPRES) 0.1 MG tablet Take 0.1 mg by mouth 2 (two) times daily. 01/08/21   [provider]  clopidogrel (PLAVIX) 75 MG tablet Take 75 mg by mouth daily. 11/25/20   [provider]  Continuous Blood Gluc Sensor (DEXCOM G6 SENSOR) MISC by Misc.(Non-Drug; Combo Route) route    [provider]  cyclobenzaprine (FLEXERIL) 10 MG tablet Take 10 mg by mouth 3 (three) times daily as needed. 11/26/20   [provider]  Estradiol-Norethindrone Acet 0.5-0.1 MG tablet Take 1 tablet by mouth daily. 12/18/20   [provider]  gabapentin (  NEURONTIN) 100 MG capsule Take by mouth. 08/05/20   [provider]  HYDROcodone-acetaminophen (NORCO) 5-325 MG tablet Take 1 tablet by mouth every 6 (six) hours as needed for severe pain. 12/06/20   Molpus, John, MD  hydrOXYzine (ATARAX/VISTARIL) 10 MG tablet Take one by mouth once daily at bedtime as needed for itching 07/16/20   [provider]  Insulin Disposable Pump (OMNIPOD 5 G6 POD, GEN 5,) MISC SMARTSIG:SUB-Q Every Other Day 12/02/20   [provider]  insulin lispro (HUMALOG) 100 UNIT/ML injection Take 200 units Via insulin pump every 3 days 10/11/19   [provider]  isosorbide mononitrate (IMDUR) 30 MG 24 hr tablet Take 1 tablet by mouth daily. 11/25/20   [provider]  JARDIANCE 25 MG TABS tablet Take 25 mg by mouth daily. 12/14/20   [provider]  lisinopril-hydrochlorothiazide (ZESTORETIC) 20-12.5 MG tablet Take 1 tablet by mouth daily. 12/04/20   [provider]  lisinopril-hydrochlorothiazide (ZESTORETIC) 20-12.5 MG tablet Take 1 tablet by mouth daily. 11/25/20   [provider]  LORazepam (ATIVAN) 0.5 MG tablet Take by mouth. 06/18/20   [provider]  methocarbamol (ROBAXIN) 500 MG tablet Take 1 tablet (500 mg total) by mouth 2 (two) times daily. 12/19/20   Tedd Sias, PA  metoprolol succinate (TOPROL-XL) 100 MG 24 hr tablet Take 100 mg by mouth daily. 12/04/20   [provider]  metroNIDAZOLE (FLAGYL) 500 MG tablet Take 1 tablet (500 mg total) by mouth 3 (three) times daily. One po bid x 7 days 12/06/20   Molpus, John, MD  mirtazapine (REMERON) 45 MG tablet Take 45 mg by mouth at bedtime. 12/18/20   [provider]  nitroGLYCERIN (NITROSTAT) 0.4 MG SL tablet SMARTSIG:1 Tablet(s) Sublingual PRN 01/05/21   [provider]  oxyCODONE-acetaminophen (PERCOCET/ROXICET) 5-325 MG tablet Take by mouth. 10/16/20   [provider]  pregabalin (LYRICA) 150 MG capsule Take 150 mg by mouth 2 (two) times daily. 08/14/20   [provider]  tizanidine (ZANAFLEX) 6 MG capsule Take by mouth. 12/03/20   [provider]  topiramate (TOPAMAX) 25 MG tablet Take 25 mg by mouth 2 (two) times daily. 10/08/20   [provider]  traMADol Veatrice Bourbon) 50 MG tablet Take by mouth. 11/20/20   [provider]  Venlafaxine HCl 225 MG TB24 Take 1 tablet by mouth daily. 12/22/20   [provider]  venlafaxine XR (EFFEXOR-XR) 75 MG 24 hr capsule Take by mouth. 06/25/20   [provider]  zolpidem (AMBIEN) 5 MG tablet Take 5-10 mg by mouth at bedtime.  12/14/20   [provider]    Allergies    Sulfamethoxazole-trimethoprim, Nsaids, and Ibuprofen  Review of Systems   Review of Systems  All other systems reviewed and are negative. Ten systems reviewed and are negative for acute change, except as noted in the HPI.   Physical Exam Updated Vital Signs BP (!) 104/56   Pulse 70   Temp 97.9 F (36.6 C)   Resp 17   Ht 5\' 9"  (1.753 m)   Wt 128.8 kg   SpO2 99%   BMI 41.94 kg/m   Physical Exam Vitals and nursing note reviewed.  Constitutional:      General: She is not in acute distress.    Appearance: Normal appearance. She is well-developed. She is not ill-appearing, toxic-appearing or diaphoretic.  HENT:     Head: Normocephalic and atraumatic.     Right Ear: External ear normal.  Left Ear: External ear normal.     Nose: Nose normal.     Mouth/Throat:     Mouth: Mucous membranes are moist.     Pharynx: Oropharynx is clear. No oropharyngeal exudate or posterior oropharyngeal erythema.  Eyes:     Extraocular Movements: Extraocular movements intact.  Cardiovascular:     Rate and Rhythm: Normal rate and regular rhythm.     Pulses: Normal pulses.          Radial pulses are 2+ on the right side and 2+ on the left side.       Dorsalis pedis pulses are 2+ on the right side and 2+ on the left side.     Heart sounds: Normal heart sounds. Heart sounds not distant. No murmur heard. No systolic murmur is present.  No diastolic murmur is present.    No friction rub. No gallop. No S3 or S4 sounds.  Pulmonary:     Effort: Pulmonary effort is normal. No tachypnea, accessory muscle usage or respiratory distress.     Breath sounds: Normal breath sounds. No stridor. No decreased breath sounds, wheezing, rhonchi or rales.  Abdominal:     General: Abdomen is flat.     Tenderness: There is no abdominal tenderness.  Musculoskeletal:        General: Normal range of motion.     Cervical back: Normal range of motion and neck supple.  No tenderness.     Right lower leg: No tenderness. No edema.     Left lower leg: No tenderness. No edema.     Comments: No pedal edema noted.  Skin:    General: Skin is warm and dry.  Neurological:     General: No focal deficit present.     Mental Status: She is alert and oriented to person, place, and time.  Psychiatric:        Mood and Affect: Mood normal.        Behavior: Behavior normal.    ED Results / Procedures / Treatments   Labs (all labs ordered are listed, but only abnormal results are displayed) Labs Reviewed  BASIC METABOLIC PANEL - Abnormal; Notable for the following components:      Result Value   Sodium 134 (*)    Potassium 3.4 (*)    Glucose, Bld 205 (*)    Creatinine, Ser 1.03 (*)    All other components within normal limits  CBC - Abnormal; Notable for the following components:   RBC 5.30 (*)    All other components within normal limits  URINALYSIS, ROUTINE W REFLEX MICROSCOPIC - Abnormal; Notable for the following components:   Glucose, UA >=500 (*)    All other components within normal limits  URINALYSIS, MICROSCOPIC (REFLEX) - Abnormal; Notable for the following components:   Bacteria, UA FEW (*)    All other components within normal limits  CBG MONITORING, ED - Abnormal; Notable for the following components:   Glucose-Capillary 199 (*)    All other components within normal limits  PREGNANCY, URINE  LIPASE, BLOOD  TROPONIN I (HIGH SENSITIVITY)  TROPONIN I (HIGH SENSITIVITY)   EKG EKG Interpretation  Date/Time:  Wednesday January 14 2021 09:54:07 EST Ventricular Rate:  71 PR Interval:  183 QRS Duration: 107 QT Interval:  391 QTC Calculation: 425 R Axis:   73 Text Interpretation: Sinus rhythm Low voltage, precordial leads Confirmed by Regan Lemming ([id number]) on 01/14/2021 2:09:59 PM  Radiology DG Chest 2 View  Result Date: 01/14/2021 CLINICAL DATA:  Left arm and chest pain over the last 2 days. EXAM: CHEST - 2 VIEW COMPARISON:  None.  FINDINGS: The heart size and mediastinal contours are within normal limits. Both lungs are clear. The visualized skeletal structures are unremarkable. IMPRESSION: No active cardiopulmonary disease. Electronically Signed   By: Nelson Chimes M.D.   On: 01/14/2021 10:52    Procedures Procedures   Medications Ordered in ED Medications  lactated ringers bolus 1,500 mL (0 mLs Intravenous Stopped 01/14/21 1252)  diphenhydrAMINE (BENADRYL) injection 12.5 mg (12.5 mg Intravenous Given 01/14/21 1135)  potassium chloride SA (KLOR-CON) CR tablet 20 mEq (20 mEq Oral Given 01/14/21 1410)    ED Course  I have reviewed the triage vital signs and the nursing notes.  Pertinent labs & imaging results that were available during my care of the patient were reviewed by me and considered in my medical decision making (see chart for details).    MDM Rules/Calculators/A&P                          Pt is a 50 y.o. female who presents to the ED due to malaise, intermittent shortness of breath, as well as left-sided chest discomfort.  Labs: CBC with RBCs of 5.3. BMP with a sodium of 134, potassium of 3.4, glucose of 205, creatinine 1.03. Lipase of 35. Pregnancy test is negative. UA with greater than 500 glucose and few bacteria. Troponin of 2 with a repeat of 2.  Imaging: Chest x-ray shows no active cardiopulmonary disease.  I, Rayna Sexton, PA-C, personally reviewed and evaluated these images and lab results as part of my medical decision-making.  Unsure the source of the patient's symptoms.  She had a negative COVID/flu test yesterday.  She does have a history of CAD and notes some left-sided chest pain for the past 2 days in addition to her other prior mentioned symptoms.  She had reassuring troponins, ECG, and chest x-ray.  Doubt ACS at this time.  She does note that her chest pain was improving when drinking ginger ale and also notes some intermittent upper abdominal pain.  None currently.  Lipase  within normal limits at 35.  Doubt pancreatitis.  Patient mildly hypokalemic at 3.4.  This was repleted with Klor-Con.  Glucose elevated at 205.  Normal anion gap of 11.  No urine ketones.  Doubt DKA at this time.  Patient treated with Benadryl as well as 1.5 L of lactated Ringer's.  She notes significant improvement in her symptoms.  Feel that she is stable for discharge at this time and she is agreeable.  We discussed return precautions at length.  Recommended PCP follow-up this week.  Her questions were answered and she was amicable at the time of discharge.  Note: Portions of this report may have been transcribed using voice recognition software. Every effort was made to ensure accuracy; however, inadvertent computerized transcription errors may be present.   Final Clinical Impression(s) / ED Diagnoses Final diagnoses:  Hyperglycemia  Atypical chest pain   Rx / DC Orders ED Discharge Orders     None        Rayna Sexton, PA-C 01/14/21 1415    Regan Lemming, MD 01/14/21 1454

## 2021-01-14 NOTE — ED Triage Notes (Addendum)
Pt having left arm pain to upper left chest discomfort and hyperglycemia for two days.  Occasional sob.  Tested yesterday for covid/flu which was negative.  Pt is on insulin pump.  She feels like she is dehydrated and has been itching.  Pt has appointment with endocrinologist in [place] soon as she states she just moved here.

## 2021-01-26 ENCOUNTER — Encounter (HOSPITAL_BASED_OUTPATIENT_CLINIC_OR_DEPARTMENT_OTHER): Payer: Self-pay | Admitting: Urology

## 2021-01-26 ENCOUNTER — Other Ambulatory Visit: Payer: Self-pay

## 2021-01-26 ENCOUNTER — Emergency Department (HOSPITAL_BASED_OUTPATIENT_CLINIC_OR_DEPARTMENT_OTHER): Payer: BLUE CROSS/BLUE SHIELD

## 2021-01-26 ENCOUNTER — Emergency Department (HOSPITAL_BASED_OUTPATIENT_CLINIC_OR_DEPARTMENT_OTHER)
Admission: EM | Admit: 2021-01-26 | Discharge: 2021-01-26 | Disposition: A | Discharge status: Home / Self Care | Payer: BLUE CROSS/BLUE SHIELD | Attending: Emergency Medical Services | Admitting: Emergency Medical Services

## 2021-01-26 DIAGNOSIS — M25512 Pain in left shoulder: Secondary | ICD-10-CM | POA: Insufficient documentation

## 2021-01-26 DIAGNOSIS — Z5321 Procedure and treatment not carried out due to patient leaving prior to being seen by health care provider: Secondary | ICD-10-CM | POA: Diagnosis not present

## 2021-01-26 NOTE — ED Triage Notes (Signed)
Left shoulder pain x 2 days  Limited ROM  Sx on same shoulder x 2 months ago

## 2021-01-26 NOTE — ED Notes (Signed)
Per registration, pt left.  

## 2021-03-05 DIAGNOSIS — M19012 Primary osteoarthritis, left shoulder: Secondary | ICD-10-CM | POA: Insufficient documentation

## 2021-03-05 HISTORY — DX: Primary osteoarthritis, left shoulder: M19.012

## 2021-03-09 ENCOUNTER — Emergency Department (HOSPITAL_BASED_OUTPATIENT_CLINIC_OR_DEPARTMENT_OTHER)
Admission: EM | Admit: 2021-03-09 | Discharge: 2021-03-09 | Disposition: A | Discharge status: Home / Self Care | Payer: 59 | Attending: Emergency Medicine | Admitting: Emergency Medicine

## 2021-03-09 ENCOUNTER — Other Ambulatory Visit: Payer: Self-pay

## 2021-03-09 ENCOUNTER — Emergency Department (HOSPITAL_BASED_OUTPATIENT_CLINIC_OR_DEPARTMENT_OTHER): Payer: 59

## 2021-03-09 ENCOUNTER — Encounter (HOSPITAL_BASED_OUTPATIENT_CLINIC_OR_DEPARTMENT_OTHER): Payer: Self-pay | Admitting: *Deleted

## 2021-03-09 DIAGNOSIS — R0602 Shortness of breath: Secondary | ICD-10-CM | POA: Diagnosis not present

## 2021-03-09 DIAGNOSIS — R0789 Other chest pain: Secondary | ICD-10-CM | POA: Diagnosis not present

## 2021-03-09 DIAGNOSIS — R110 Nausea: Secondary | ICD-10-CM | POA: Insufficient documentation

## 2021-03-09 DIAGNOSIS — Z5321 Procedure and treatment not carried out due to patient leaving prior to being seen by health care provider: Secondary | ICD-10-CM | POA: Insufficient documentation

## 2021-03-09 LAB — BASIC METABOLIC PANEL
Anion gap: 12 (ref 5–15)
BUN: 13 mg/dL (ref 6–20)
CO2: 21 mmol/L — ABNORMAL LOW (ref 22–32)
Calcium: 9.3 mg/dL (ref 8.9–10.3)
Chloride: 102 mmol/L (ref 98–111)
Creatinine, Ser: 1.01 mg/dL — ABNORMAL HIGH (ref 0.44–1.00)
GFR, Estimated: >60 mL/min (ref >60)
Glucose, Bld: 281 mg/dL — ABNORMAL HIGH (ref 70–99)
Potassium: 4.2 mmol/L (ref 3.5–5.1)
Sodium: 135 mmol/L (ref 135–145)

## 2021-03-09 LAB — CBC
HCT: 43.5 % (ref 36.0–46.0)
Hemoglobin: 14.5 g/dL (ref 12.0–15.0)
MCH: 27.4 pg (ref 26.0–34.0)
MCHC: 33.3 g/dL (ref 30.0–36.0)
MCV: 82.2 fL (ref 80.0–100.0)
Platelets: 226 10*3/uL (ref 150–400)
RBC: 5.29 MIL/uL — ABNORMAL HIGH (ref 3.87–5.11)
RDW: 13.4 % (ref 11.5–15.5)
WBC: 6.9 10*3/uL (ref 4.0–10.5)
nRBC: 0 % (ref 0.0–0.2)

## 2021-03-09 LAB — TROPONIN I (HIGH SENSITIVITY): Troponin I (High Sensitivity): 2 ng/L (ref <18)

## 2021-03-09 NOTE — ED Notes (Signed)
Unable to find pt for labs.  Went back and called again with no response.  3rd time to room patient and not able to find.

## 2021-03-09 NOTE — ED Triage Notes (Signed)
She woke with nausea and sob. Hx cardiac pt. Pressure in her left chest today. EKG at triage.

## 2021-03-09 NOTE — ED Notes (Signed)
Patient transported to X-ray 

## 2021-03-12 ENCOUNTER — Encounter (HOSPITAL_BASED_OUTPATIENT_CLINIC_OR_DEPARTMENT_OTHER): Payer: Self-pay

## 2021-03-12 ENCOUNTER — Other Ambulatory Visit: Payer: Self-pay

## 2021-03-12 ENCOUNTER — Observation Stay (HOSPITAL_BASED_OUTPATIENT_CLINIC_OR_DEPARTMENT_OTHER)
Admission: EM | Admit: 2021-03-12 | Discharge: 2021-03-13 | Disposition: A | Discharge status: Home / Self Care | Payer: 59 | Attending: Emergency Medicine | Admitting: Emergency Medicine

## 2021-03-12 ENCOUNTER — Emergency Department (HOSPITAL_BASED_OUTPATIENT_CLINIC_OR_DEPARTMENT_OTHER): Payer: 59

## 2021-03-12 DIAGNOSIS — E669 Obesity, unspecified: Secondary | ICD-10-CM | POA: Diagnosis not present

## 2021-03-12 DIAGNOSIS — Z9861 Coronary angioplasty status: Secondary | ICD-10-CM | POA: Diagnosis not present

## 2021-03-12 DIAGNOSIS — Z79899 Other long term (current) drug therapy: Secondary | ICD-10-CM | POA: Insufficient documentation

## 2021-03-12 DIAGNOSIS — Z87891 Personal history of nicotine dependence: Secondary | ICD-10-CM | POA: Diagnosis not present

## 2021-03-12 DIAGNOSIS — E1165 Type 2 diabetes mellitus with hyperglycemia: Secondary | ICD-10-CM | POA: Diagnosis present

## 2021-03-12 DIAGNOSIS — R079 Chest pain, unspecified: Principal | ICD-10-CM | POA: Diagnosis present

## 2021-03-12 DIAGNOSIS — N182 Chronic kidney disease, stage 2 (mild): Secondary | ICD-10-CM | POA: Diagnosis present

## 2021-03-12 DIAGNOSIS — R0789 Other chest pain: Secondary | ICD-10-CM

## 2021-03-12 DIAGNOSIS — Z7902 Long term (current) use of antithrombotics/antiplatelets: Secondary | ICD-10-CM | POA: Insufficient documentation

## 2021-03-12 DIAGNOSIS — I10 Essential (primary) hypertension: Secondary | ICD-10-CM | POA: Diagnosis not present

## 2021-03-12 DIAGNOSIS — Z794 Long term (current) use of insulin: Secondary | ICD-10-CM | POA: Diagnosis not present

## 2021-03-12 DIAGNOSIS — I2510 Atherosclerotic heart disease of native coronary artery without angina pectoris: Secondary | ICD-10-CM | POA: Diagnosis present

## 2021-03-12 DIAGNOSIS — E1122 Type 2 diabetes mellitus with diabetic chronic kidney disease: Secondary | ICD-10-CM | POA: Diagnosis not present

## 2021-03-12 DIAGNOSIS — M25512 Pain in left shoulder: Secondary | ICD-10-CM | POA: Diagnosis not present

## 2021-03-12 DIAGNOSIS — Z20822 Contact with and (suspected) exposure to covid-19: Secondary | ICD-10-CM | POA: Diagnosis not present

## 2021-03-12 DIAGNOSIS — I129 Hypertensive chronic kidney disease with stage 1 through stage 4 chronic kidney disease, or unspecified chronic kidney disease: Secondary | ICD-10-CM | POA: Diagnosis not present

## 2021-03-12 DIAGNOSIS — G8929 Other chronic pain: Secondary | ICD-10-CM | POA: Diagnosis not present

## 2021-03-12 HISTORY — DX: Malignant hyperthermia due to anesthesia, initial encounter: T88.3XXA

## 2021-03-12 HISTORY — DX: Chronic kidney disease, stage 2 (mild): N18.2

## 2021-03-12 LAB — COMPREHENSIVE METABOLIC PANEL
ALT: 38 U/L (ref 0–44)
AST: 49 U/L — ABNORMAL HIGH (ref 15–41)
Albumin: 4.3 g/dL (ref 3.5–5.0)
Alkaline Phosphatase: 69 U/L (ref 38–126)
Anion gap: 13 (ref 5–15)
BUN: 17 mg/dL (ref 6–20)
CO2: 21 mmol/L — ABNORMAL LOW (ref 22–32)
Calcium: 9.8 mg/dL (ref 8.9–10.3)
Chloride: 98 mmol/L (ref 98–111)
Creatinine, Ser: 1.04 mg/dL — ABNORMAL HIGH (ref 0.44–1.00)
GFR, Estimated: >60 mL/min (ref >60)
Glucose, Bld: 272 mg/dL — ABNORMAL HIGH (ref 70–99)
Potassium: 3.9 mmol/L (ref 3.5–5.1)
Sodium: 132 mmol/L — ABNORMAL LOW (ref 135–145)
Total Bilirubin: 0.4 mg/dL (ref 0.3–1.2)
Total Protein: 8.3 g/dL — ABNORMAL HIGH (ref 6.5–8.1)

## 2021-03-12 LAB — CBC WITH DIFFERENTIAL/PLATELET
Abs Immature Granulocytes: 0.07 10*3/uL (ref 0.00–0.07)
Basophils Absolute: 0 10*3/uL (ref 0.0–0.1)
Basophils Relative: 1 %
Eosinophils Absolute: 0.1 10*3/uL (ref 0.0–0.5)
Eosinophils Relative: 1 %
HCT: 45.3 % (ref 36.0–46.0)
Hemoglobin: 15.4 g/dL — ABNORMAL HIGH (ref 12.0–15.0)
Immature Granulocytes: 1 %
Lymphocytes Relative: 31 %
Lymphs Abs: 2.5 10*3/uL (ref 0.7–4.0)
MCH: 27.8 pg (ref 26.0–34.0)
MCHC: 34 g/dL (ref 30.0–36.0)
MCV: 81.8 fL (ref 80.0–100.0)
Monocytes Absolute: 0.6 10*3/uL (ref 0.1–1.0)
Monocytes Relative: 7 %
Neutro Abs: 4.8 10*3/uL (ref 1.7–7.7)
Neutrophils Relative %: 59 %
Platelets: 255 10*3/uL (ref 150–400)
RBC: 5.54 MIL/uL — ABNORMAL HIGH (ref 3.87–5.11)
RDW: 13.7 % (ref 11.5–15.5)
WBC: 8 10*3/uL (ref 4.0–10.5)
nRBC: 0 % (ref 0.0–0.2)

## 2021-03-12 LAB — TROPONIN I (HIGH SENSITIVITY)
Troponin I (High Sensitivity): 3 ng/L (ref <18)
Troponin I (High Sensitivity): 4 ng/L (ref <18)
Troponin I (High Sensitivity): 6 ng/L (ref <18)

## 2021-03-12 LAB — D-DIMER, QUANTITATIVE: D-Dimer, Quant: 0.73 ug/mL-FEU — ABNORMAL HIGH (ref 0.00–0.50)

## 2021-03-12 LAB — RESP PANEL BY RT-PCR (FLU A&B, COVID) ARPGX2
Influenza A by PCR: NEGATIVE
Influenza B by PCR: NEGATIVE
SARS Coronavirus 2 by RT PCR: NEGATIVE

## 2021-03-12 LAB — GLUCOSE, CAPILLARY: Glucose-Capillary: 270 mg/dL — ABNORMAL HIGH (ref 70–99)

## 2021-03-12 LAB — MRSA NEXT GEN BY PCR, NASAL: MRSA by PCR Next Gen: NOT DETECTED

## 2021-03-12 LAB — LIPASE, BLOOD: Lipase: 44 U/L (ref 11–51)

## 2021-03-12 MED ORDER — ONDANSETRON HCL 4 MG/2ML IJ SOLN: 4.0000 mg | Freq: Four times a day (QID) | INTRAMUSCULAR | Status: DC | PRN

## 2021-03-12 MED ORDER — ACETAMINOPHEN 325 MG PO TABS
650.0000 mg | ORAL_TABLET | ORAL | Status: DC | PRN
  Administered 2021-03-12: 650 mg via ORAL
  Filled 2021-03-12: qty 2

## 2021-03-12 MED ORDER — ENOXAPARIN SODIUM 60 MG/0.6ML IJ SOSY
60.0000 mg | PREFILLED_SYRINGE | INTRAMUSCULAR | Status: DC
  Administered 2021-03-12: 60 mg via SUBCUTANEOUS
  Filled 2021-03-12: qty 0.6

## 2021-03-12 MED ORDER — LORAZEPAM 2 MG/ML IJ SOLN
1.0000 mg | Freq: Once | INTRAMUSCULAR | Status: AC
Start: 2021-03-12 — End: 2021-03-12
  Administered 2021-03-12: 1 mg via INTRAVENOUS
  Filled 2021-03-12: qty 1

## 2021-03-12 MED ORDER — LISINOPRIL 20 MG PO TABS
20.0000 mg | ORAL_TABLET | Freq: Every day | ORAL | Status: DC
  Administered 2021-03-13: 20 mg via ORAL
  Filled 2021-03-12: qty 1

## 2021-03-12 MED ORDER — ALUM & MAG HYDROXIDE-SIMETH 200-200-20 MG/5ML PO SUSP: 30.0000 mL | ORAL | Status: DC | PRN

## 2021-03-12 MED ORDER — ENOXAPARIN SODIUM 40 MG/0.4ML IJ SOSY: 40.0000 mg | PREFILLED_SYRINGE | INTRAMUSCULAR | Status: DC

## 2021-03-12 MED ORDER — HYDROCHLOROTHIAZIDE 12.5 MG PO TABS
12.5000 mg | ORAL_TABLET | Freq: Every day | ORAL | Status: DC
  Administered 2021-03-13: 12.5 mg via ORAL
  Filled 2021-03-12: qty 1

## 2021-03-12 MED ORDER — SODIUM CHLORIDE 0.9 % IV BOLUS
500.0000 mL | Freq: Once | INTRAVENOUS | Status: AC
  Administered 2021-03-12: 500 mL via INTRAVENOUS

## 2021-03-12 MED ORDER — ASPIRIN EC 81 MG PO TBEC
81.0000 mg | DELAYED_RELEASE_TABLET | Freq: Every day | ORAL | Status: DC
  Administered 2021-03-13: 81 mg via ORAL
  Filled 2021-03-12: qty 1

## 2021-03-12 MED ORDER — ZOLPIDEM TARTRATE 5 MG PO TABS
5.0000 mg | ORAL_TABLET | Freq: Every evening | ORAL | Status: DC | PRN
  Administered 2021-03-12: 5 mg via ORAL
  Filled 2021-03-12: qty 1

## 2021-03-12 MED ORDER — CLOPIDOGREL BISULFATE 75 MG PO TABS
75.0000 mg | ORAL_TABLET | Freq: Every day | ORAL | Status: DC
  Administered 2021-03-13: 75 mg via ORAL
  Filled 2021-03-12: qty 1

## 2021-03-12 MED ORDER — ATORVASTATIN CALCIUM 40 MG PO TABS
40.0000 mg | ORAL_TABLET | Freq: Every day | ORAL | Status: DC
  Administered 2021-03-13: 40 mg via ORAL
  Filled 2021-03-12: qty 1

## 2021-03-12 MED ORDER — LISINOPRIL-HYDROCHLOROTHIAZIDE 20-12.5 MG PO TABS: 1.0000 | ORAL_TABLET | Freq: Every day | ORAL | Status: DC

## 2021-03-12 MED ORDER — NITROGLYCERIN 0.4 MG SL SUBL
0.4000 mg | SUBLINGUAL_TABLET | SUBLINGUAL | Status: DC | PRN
  Administered 2021-03-12 (×3): 0.4 mg via SUBLINGUAL
  Filled 2021-03-12: qty 1

## 2021-03-12 MED ORDER — ISOSORBIDE MONONITRATE ER 30 MG PO TB24
30.0000 mg | ORAL_TABLET | Freq: Every day | ORAL | Status: DC
  Administered 2021-03-13: 30 mg via ORAL
  Filled 2021-03-12: qty 1

## 2021-03-12 MED ORDER — ALPRAZOLAM 0.5 MG PO TABS
0.2500 mg | ORAL_TABLET | Freq: Two times a day (BID) | ORAL | Status: DC | PRN
  Administered 2021-03-13 (×2): 0.25 mg via ORAL
  Filled 2021-03-12 (×2): qty 1

## 2021-03-12 MED ORDER — INSULIN GLARGINE-YFGN 100 UNIT/ML ~~LOC~~ SOLN
15.0000 [IU] | Freq: Two times a day (BID) | SUBCUTANEOUS | Status: DC
  Administered 2021-03-13: 15 [IU] via SUBCUTANEOUS
  Filled 2021-03-12 (×4): qty 0.15

## 2021-03-12 MED ORDER — CLONIDINE HCL 0.1 MG PO TABS
0.1000 mg | ORAL_TABLET | Freq: Two times a day (BID) | ORAL | Status: DC
  Administered 2021-03-12 – 2021-03-13 (×2): 0.1 mg via ORAL
  Filled 2021-03-12 (×2): qty 1

## 2021-03-12 MED ORDER — INSULIN ASPART 100 UNIT/ML IJ SOLN
0.0000 [IU] | INTRAMUSCULAR | Status: DC
  Administered 2021-03-13 (×2): 11 [IU] via SUBCUTANEOUS
  Administered 2021-03-13: 3 [IU] via SUBCUTANEOUS
  Administered 2021-03-13 (×2): 5 [IU] via SUBCUTANEOUS

## 2021-03-12 MED ORDER — TOPIRAMATE 25 MG PO TABS
25.0000 mg | ORAL_TABLET | Freq: Two times a day (BID) | ORAL | Status: DC
  Administered 2021-03-12 – 2021-03-13 (×2): 25 mg via ORAL
  Filled 2021-03-12 (×3): qty 1

## 2021-03-12 MED ORDER — IOHEXOL 350 MG/ML SOLN
100.0000 mL | Freq: Once | INTRAVENOUS | Status: AC | PRN
  Administered 2021-03-12: 100 mL via INTRAVENOUS

## 2021-03-12 MED ORDER — METOPROLOL SUCCINATE ER 100 MG PO TB24
100.0000 mg | ORAL_TABLET | Freq: Every day | ORAL | Status: DC
Start: 2021-03-12 — End: 2021-03-13
  Administered 2021-03-12 – 2021-03-13 (×2): 100 mg via ORAL
  Filled 2021-03-12 (×2): qty 1

## 2021-03-12 NOTE — ED Notes (Signed)
Patient transported to CT 

## 2021-03-12 NOTE — ED Notes (Signed)
Report given to [facility]. ETA 30 minutes.

## 2021-03-12 NOTE — ED Provider Notes (Signed)
[facility] Provider Note   CSN: [id number] Arrival date & time: 03/12/21  1255     History  Chief Complaint  Patient presents with   Chest Pain    Jean Calderon is a 51 y.o. female.  HPI  This is a 51 year old female with history of hypertension, diabetes, CAD presenting with chest pain.  The chest pain started 3 days ago, its constant and left-sided without radiation.  It feels like pressure, last night it became more severe prompting her visit today.  There is no nausea or vomiting, is associated with shortness of breath.  Has not tried any over-the-counter pain medicine, has not noticed any alleviating factors.  Seems to be positional, worse when leaning forward.  Patient endorses left rotator cuff surgery in December, no previous history of blood clots and is not on blood thinners.  Prior MI, 4 stent placement (2010). Is followed by cardiology in [place].  Scheduled for echo in 2 months.  History: 20+ tobacco h/o, quit.  Past Medical History:  Diagnosis Date   CAD (coronary artery disease)    DM (diabetes mellitus) ([place])    HTN (hypertension)    Neuropathy      Home Medications Prior to Admission medications   Medication Sig Start Date End Date Taking? Authorizing Provider  albuterol (VENTOLIN HFA) 108 (90 Base) MCG/ACT inhaler SMARTSIG:2 Puff(s) By Mouth Every 4 Hours PRN 10/01/20   [provider]  atorvastatin (LIPITOR) 40 MG tablet Take 1 tablet by mouth daily. 09/01/20   [provider]  Ciclopirox 0.77 % gel Apply topically 2 (two) times daily. 09/25/20   [provider]  cloNIDine (CATAPRES) 0.1 MG tablet Take 0.1 mg by mouth 2 (two) times daily. 01/08/21   [provider]  clopidogrel (PLAVIX) 75 MG tablet Take 75 mg by mouth daily. 11/25/20   [provider]  Continuous Blood Gluc Sensor (DEXCOM G6 SENSOR) MISC by Misc.(Non-Drug; Combo Route) route    [provider]   Estradiol-Norethindrone Acet 0.5-0.1 MG tablet Take 1 tablet by mouth daily. 12/18/20   [provider]  gabapentin (NEURONTIN) 100 MG capsule Take by mouth. 08/05/20   [provider]  hydrOXYzine (ATARAX/VISTARIL) 10 MG tablet Take one by mouth once daily at bedtime as needed for itching 07/16/20   [provider]  Insulin Disposable Pump (OMNIPOD 5 G6 POD, GEN 5,) MISC SMARTSIG:SUB-Q Every Other Day 12/02/20   [provider]  insulin lispro (HUMALOG) 100 UNIT/ML injection Take 200 units Via insulin pump every 3 days 10/11/19   [provider]  isosorbide mononitrate (IMDUR) 30 MG 24 hr tablet Take 1 tablet by mouth daily. 11/25/20   [provider]  JARDIANCE 25 MG TABS tablet Take 25 mg by mouth daily. 12/14/20   [provider]  lisinopril-hydrochlorothiazide (ZESTORETIC) 20-12.5 MG tablet Take 1 tablet by mouth daily. 12/04/20   [provider]  lisinopril-hydrochlorothiazide (ZESTORETIC) 20-12.5 MG tablet Take 1 tablet by mouth daily. 11/25/20   [provider]  LORazepam (ATIVAN) 0.5 MG tablet Take by mouth. 06/18/20   [provider]  methocarbamol (ROBAXIN) 500 MG tablet Take 1 tablet (500 mg total) by mouth 2 (two) times daily. 12/19/20   Tedd Sias, PA  metoprolol succinate (TOPROL-XL) 100 MG 24 hr tablet Take 100 mg by mouth daily. 12/04/20   [provider]  mirtazapine (REMERON) 45 MG tablet Take 45 mg by mouth at bedtime. 12/18/20   [provider]  nitroGLYCERIN (NITROSTAT) 0.4 MG SL tablet SMARTSIG:1 Tablet(s) Sublingual PRN 01/05/21   [provider]  oxyCODONE-acetaminophen (PERCOCET/ROXICET) 5-325 MG tablet Take by mouth. 10/16/20   [provider]  tizanidine (ZANAFLEX) 6 MG capsule Take by mouth. 12/03/20   [provider]  topiramate (TOPAMAX) 25 MG tablet Take 25 mg by mouth 2 (two) times daily. 10/08/20   [provider]  traMADol  Veatrice Bourbon) 50 MG tablet Take by mouth. 11/20/20   [provider]  Venlafaxine HCl 225 MG TB24 Take 1 tablet by mouth daily. 12/22/20   [provider]  venlafaxine XR (EFFEXOR-XR) 75 MG 24 hr capsule Take by mouth. 06/25/20   [provider]      Allergies    Sulfamethoxazole-trimethoprim, Nsaids, and Ibuprofen    Review of Systems   Review of Systems  Constitutional:  Negative for fever.  Respiratory:  Positive for shortness of breath.   Cardiovascular:  Positive for chest pain. Negative for leg swelling.  Gastrointestinal:  Negative for abdominal pain, constipation, nausea and vomiting.  Musculoskeletal:  Positive for myalgias. Negative for back pain and neck pain.  Neurological:  Negative for syncope and numbness.  Psychiatric/Behavioral:  The patient is nervous/anxious.    Physical Exam Updated Vital Signs BP (!) 147/89    Pulse (!) 117    Temp 98 F (36.7 C) (Oral)    Resp 12    Ht 5\' 9"  (1.753 m)    Wt 122.5 kg    SpO2 97%    BMI 39.87 kg/m  Physical Exam Vitals and nursing note reviewed. Exam conducted with a chaperone present.  Constitutional:      Appearance: Normal appearance.     Comments: Patient tearful but not ill-appearing  HENT:     Head: Normocephalic and atraumatic.  Eyes:     General: No scleral icterus.       Right eye: No discharge.        Left eye: No discharge.     Extraocular Movements: Extraocular movements intact.     Pupils: Pupils are equal, round, and reactive to light.  Cardiovascular:     Rate and Rhythm: Regular rhythm. Tachycardia present.     Pulses: Normal pulses.     Heart sounds: Normal heart sounds. No murmur heard.   No friction rub. No gallop.  Pulmonary:     Effort: Pulmonary effort is normal. No respiratory distress.     Breath sounds: Normal breath sounds.  Abdominal:     General: Abdomen is flat. Bowel sounds are normal. There is no distension.     Palpations: Abdomen is soft.     Tenderness: There is  no abdominal tenderness.  Musculoskeletal:     Right lower leg: No edema.     Left lower leg: No edema.  Skin:    General: Skin is warm and dry.     Coloration: Skin is not jaundiced.  Neurological:     Mental Status: She is alert. Mental status is at baseline.     Coordination: Coordination normal.  Psychiatric:        Mood and Affect: Mood is anxious.   ED Results / Procedures / Treatments   Labs (all labs ordered are listed, but only abnormal results are displayed) Labs Reviewed  CBC WITH DIFFERENTIAL/PLATELET - Abnormal; Notable for the following components:      Result Value   RBC 5.54 (*)    Hemoglobin 15.4 (*)    All other components within normal limits  COMPREHENSIVE METABOLIC PANEL - Abnormal; Notable for the following components:   Sodium 132 (*)    CO2 21 (*)    Glucose, Bld 272 (*)    Creatinine, Ser 1.04 (*)    Total Protein 8.3 (*)    AST 49 (*)    All other components within normal limits  D-DIMER, QUANTITATIVE - Abnormal; Notable for the following components:   D-Dimer, Quant 0.73 (*)    All other components within normal limits  RESP PANEL BY RT-PCR (FLU A&B, COVID) ARPGX2  TROPONIN I (HIGH SENSITIVITY)  TROPONIN I (HIGH SENSITIVITY)    EKG EKG Interpretation  Date/Time:  Thursday March 12 2021 13:13:19 EST Ventricular Rate:  114 PR Interval:  140 QRS Duration: 105 QT Interval:  331 QTC Calculation: 456 R Axis:   86 Text Interpretation: Sinus tachycardia Low voltage, extremity and precordial leads Confirmed by Lennice Sites ([id number]) on 03/12/2021 2:01:26 PM  Radiology CT Angio Chest PE W/Cm &/Or Wo Cm  Result Date: 03/12/2021 CLINICAL DATA:  Concern for pulmonary embolism.  Positive D-dimer. EXAM: CT ANGIOGRAPHY CHEST WITH CONTRAST TECHNIQUE: Multidetector CT imaging of the chest was performed using the standard protocol during bolus administration of intravenous contrast. Multiplanar CT image reconstructions and MIPs were obtained to evaluate the  vascular anatomy. RADIATION DOSE REDUCTION: This exam was performed according to the departmental dose-optimization program which includes automated exposure control, adjustment of the mA and/or kV according to patient size and/or use of iterative reconstruction technique. CONTRAST:  136mL OMNIPAQUE IOHEXOL 350 MG/ML SOLN COMPARISON:  None. FINDINGS: Cardiovascular: No filling defects within the pulmonary arteries to suggest acute pulmonary embolism. Coronary artery calcification and aortic atherosclerotic calcification. Mediastinum/Nodes: No enlarged mediastinal, hilar, or axillary lymph nodes. Thyroid gland, trachea, and esophagus demonstrate no significant findings. Lungs/Pleura: Lungs are clear. No pleural effusion or pneumothorax. Upper Abdomen: No acute abnormality. Musculoskeletal: No chest wall abnormality. No acute or significant osseous findings. Review of the MIP images confirms the above findings. IMPRESSION: 1. No acute pulmonary embolism. 2. No acute pulmonary parenchymal findings. 3. Coronary artery calcification and Aortic Atherosclerosis (ICD10-I70.0). Electronically Signed   By: Suzy Bouchard M.D.   On: 03/12/2021 16:00   DG Chest Portable 1 View  Result Date: 03/12/2021 CLINICAL DATA:  Chest pain and shortness of breath for the past 3 days. EXAM: PORTABLE CHEST 1 VIEW COMPARISON:  03/09/2021 FINDINGS: Normal sized heart. Clear lungs with normal vascularity. Surgical absence of the distal right clavicle. IMPRESSION: No acute abnormality. Electronically Signed   By: Claudie Revering M.D.   On: 03/12/2021 14:37    Procedures Procedures    Medications Ordered in ED Medications  nitroGLYCERIN (NITROSTAT) SL tablet 0.4 mg (0.4 mg Sublingual Given 03/12/21 1702)  sodium chloride 0.9 % bolus 500 mL (0 mLs Intravenous Stopped 03/12/21 1615)  iohexol (OMNIPAQUE) 350 MG/ML injection 100 mL (100 mLs Intravenous Contrast Given 03/12/21 1530)  LORazepam (ATIVAN) injection 1 mg (1 mg Intravenous Given  03/12/21 1618)    ED Course/ Medical Decision Making/ A&P                           Medical Decision Making  This is a 51 year old female presenting due to chest pain or shortness of breath.  Additional history obtained: -Additional history obtained from Chart Review.  -External records from outside source obtained and reviewed including: Chart review including previous notes, labs, imaging, consultation notes 01/05/2021 Cardiology visit.  Chronic coronary artery disease catheterization 2022  diffuse 30% lesions all 3  Cardiac cath 05/29/2020 The ejection fraction is 55-65% by visual estimate.  The left ventricular systolic function is normal.  LV end diastolic pressure is normal. Dominant vessel: the right coronary artery Left main: 0% LAD: 50% prox, 30% mid, 40% distal, Diagonal: 1: 30%. Stents are open Ramus: 30% Circumflex: 30% prox, 30% mid, 30% distal, OM: 1st: 30%, LPDA: 0% RCA: 30% prox, 30% mid, 30% distal, RPDA 0%, RPLB 0% Bypass Graft(s): none EF 60%   Lab Tests: -I ordered, reviewed, and interpreted labs.  The pertinent results include:  Negative troponin, chest pain has been constant for 3 days so did not order a delta.   CBC: Slight elevated hematocrit, no leukocytosis.   BMP: There is no gross electrolyte derangement, creatinine and AST are slightly elevated but roughly at baseline.   Dimer was slightly elevated, CTA ordered.   EKG -Sinus tachycardia, no ischemic changes.   Imaging Studies ordered: -I ordered imaging studies including chest x-ray and CTA. -I independently visualized and interpreted imaging which showed normal cardiac silhouette on the chest x-ray without any evidence of pneumonia or cardiomegaly concerning for new onset of CHF.  CTA was ordered and did not show any evidence of PE. -I agree with the radiologist interpretation   Medicines ordered and prescription drug management: -I ordered medication including Ativan for anxiety and nitroglycerin  for the chest pain.  Patient denies any change to her anxiety level, her chest pain did improve with nitroglycerin. -Reevaluation of the patient after these medicines showed that the patient improved -I have reviewed the patients home medicines and have made adjustments as needed   Consultations Obtained: I requested consultation with the hospitalist,  and discussed lab and imaging findings as well as pertinent plan - they recommend: Trending troponins and admission   ED Course: CTA unremarkable for PE but patient reminded remains tachycardic.  EKG is without ischemic findings, troponin was negative.  Patient's pain has been constant for 3 days but it did improve somewhat with nitroglycerin.  She remains persistently tachycardic, although I do not believe she has having a PE the fact that the chest pain improved with nitroglycerin is concerning for cardiac etiology.  Given her history I do think she could benefit from inpatient admission for unstable angina/persistent chest pain given her risk factors.    Cardiac Monitoring: The patient was maintained on a cardiac monitor.  I personally viewed and interpreted the cardiac monitored which showed an underlying rhythm of: Sinus rhythm.  Heart rate has been between 90-1 30 throughout the day today.  No arrhythmia noted.  No ischemic findings.   Reevaluation: After the interventions noted above, I reevaluated the patient and found that they have :stayed the same.  Patient remains tachycardic.  The chest pain is still present but it did improve somewhat with the nitroglycerin.   Dispostion: Spoke with hospitalist on-call.  He is agreeable to admission, will put in bed request for telemetry medicine bed.  He request to trend troponins, orders placed.        Final Clinical Impression(s) / ED Diagnoses Final diagnoses:  Other chest pain    Rx / DC Orders ED Discharge Orders     None         Sherrill Raring, PA-C 03/12/21 1754     Gareth Morgan, MD 03/13/21 1125

## 2021-03-12 NOTE — ED Triage Notes (Signed)
Pt c/o CP/SOB started 1/9-pt entered triage in w/c-crying/hysterical-encouraged to take slow deep breaths to calm herself-was seen here for same c/o 1/9-LWBS

## 2021-03-12 NOTE — H&P (Signed)
History and Physical    Jean Calderon ZDG:[id number] DOB: [birth year] DOA: 03/12/2021  PCP: Azzie Glatter, FNP   Patient coming from: Home  Chief Complaint: Chest pain   HPI: Jean Calderon is a pleasant 51 y.o. female with medical history significant for coronary artery disease, mild renal insufficiency, insulin-dependent diabetes mellitus, hypertension, anxiety, and chronic left shoulder pain, now presenting to the emergency department for evaluation of chest pain.  Patient reports that she was in her usual state and at rest on 03/09/2021 when she developed pain localized to the lower central chest.  She describes this as a pressure, constant, sometimes worse when sitting up or when walking up stairs, associated with nausea but no vomiting, and associated with shortness of breath.  Intensity increased significantly last night and she felt as though she could not catch her breath.  This occurred while she was laying in bed.  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  She reports being off of her anxiety medications recently and has been suffering from severe anxiety.  She denies leg swelling, cough, fever, or chills.  [facility] ED Course: Upon arrival to the ED, patient is found to be afebrile, saturating well on room air, tachycardic as high as 130s, and with stable blood pressure.  EKG features sinus tachycardia.  Chest x-ray negative for acute findings.  Troponin was 3 and then 4.  D-dimer 0.73.  CTA chest negative for PE or acute pulmonary disease.  Chemistry panel notable for glucose 272, creatinine 1.04, and slight elevation in AST.  Patient was given 500 cc of saline and 3 sublingual nitroglycerin in the ED.  She reports mild improvement with nitroglycerin.  She was transferred to [facility] for admission.  Review of Systems:  All other systems reviewed and apart  from HPI, are negative.  Past Medical History:  Diagnosis Date   CAD (coronary artery disease)    CKD (chronic kidney disease), stage II    DM (diabetes mellitus) (HCC)    HTN (hypertension)    Malignant hyperthermia    Neuropathy     Past Surgical History:  Procedure Laterality Date   APPENDECTOMY     BACK SURGERY     CESAREAN SECTION     CORONARY ANGIOPLASTY WITH STENT PLACEMENT     ROTATOR CUFF REPAIR      Social History:   reports that she has quit smoking. Her smoking use included cigarettes. She has never used smokeless tobacco. She reports that she does not currently use alcohol. She reports that she does not use drugs.  Allergies  Allergen Reactions   Sulfamethoxazole-Trimethoprim Nausea And Vomiting    History of Colitis   Nsaids Other (See Comments)    Cannot take because she's on Plavix.     Ibuprofen     Not an allergy. Pt stated she can't take it because of current use of ASA and Plavix    Family History  Problem Relation Age of Onset   Heart disease Mother    Heart disease Sister      Prior to Admission medications   Medication Sig Start Date End Date Taking? Authorizing Provider  albuterol (VENTOLIN HFA) 108 (90 Base) MCG/ACT inhaler SMARTSIG:2 Puff(s) By Mouth Every 4 Hours PRN 10/01/20   [provider]  atorvastatin (LIPITOR) 40 MG tablet Take 1 tablet by mouth daily. 09/01/20   [provider]  Ciclopirox 0.77 % gel Apply topically 2 (two) times daily. 09/25/20   [provider]  cloNIDine (CATAPRES) 0.1 MG tablet Take 0.1 mg by mouth 2 (two) times daily. 01/08/21   [provider]  clopidogrel (PLAVIX) 75 MG tablet Take 75 mg by mouth daily. 11/25/20   [provider]  Continuous Blood Gluc Sensor (DEXCOM G6 SENSOR) MISC by Misc.(Non-Drug; Combo Route) route    [provider]  Estradiol-Norethindrone Acet 0.5-0.1 MG tablet Take 1 tablet by mouth daily. 12/18/20   [provider]   gabapentin (NEURONTIN) 100 MG capsule Take by mouth. 08/05/20   [provider]  hydrOXYzine (ATARAX/VISTARIL) 10 MG tablet Take one by mouth once daily at bedtime as needed for itching 07/16/20   [provider]  Insulin Disposable Pump (OMNIPOD 5 G6 POD, GEN 5,) MISC SMARTSIG:SUB-Q Every Other Day 12/02/20   [provider]  insulin lispro (HUMALOG) 100 UNIT/ML injection Take 200 units Via insulin pump every 3 days 10/11/19   [provider]  isosorbide mononitrate (IMDUR) 30 MG 24 hr tablet Take 1 tablet by mouth daily. 11/25/20   [provider]  JARDIANCE 25 MG TABS tablet Take 25 mg by mouth daily. 12/14/20   [provider]  lisinopril-hydrochlorothiazide (ZESTORETIC) 20-12.5 MG tablet Take 1 tablet by mouth daily. 12/04/20   [provider]  lisinopril-hydrochlorothiazide (ZESTORETIC) 20-12.5 MG tablet Take 1 tablet by mouth daily. 11/25/20   [provider]  LORazepam (ATIVAN) 0.5 MG tablet Take by mouth. 06/18/20   [provider]  methocarbamol (ROBAXIN) 500 MG tablet Take 1 tablet (500 mg total) by mouth 2 (two) times daily. 12/19/20   Tedd Sias, PA  metoprolol succinate (TOPROL-XL) 100 MG 24 hr tablet Take 100 mg by mouth daily. 12/04/20   [provider]  mirtazapine (REMERON) 45 MG tablet Take 45 mg by mouth at bedtime. 12/18/20   [provider]  nitroGLYCERIN (NITROSTAT) 0.4 MG SL tablet SMARTSIG:1 Tablet(s) Sublingual PRN 01/05/21   [provider]  oxyCODONE-acetaminophen (PERCOCET/ROXICET) 5-325 MG tablet Take by mouth. 10/16/20   [provider]  tizanidine (ZANAFLEX) 6 MG capsule Take by mouth. 12/03/20   [provider]  topiramate (TOPAMAX) 25 MG tablet Take 25 mg by mouth 2 (two) times daily. 10/08/20   [provider]  traMADol Veatrice Bourbon) 50 MG tablet Take by mouth. 11/20/20   [provider]  Venlafaxine HCl 225 MG TB24 Take 1 tablet by  mouth daily. 12/22/20   [provider]  venlafaxine XR (EFFEXOR-XR) 75 MG 24 hr capsule Take by mouth. 06/25/20   [provider]    Physical Exam: Vitals:   03/12/21 1900 03/12/21 1930 03/12/21 2000 03/12/21 2056  BP: 124/74 137/90 (!) 159/103 (!) 153/106  Pulse: (!) 117 (!) 119 (!) 126 (!) 120  Resp: (!) 21 20 (!) 27 (!) 22  Temp:    98.8 F (37.1 C)  TempSrc:    Oral  SpO2: 96% 98% 100% 97%  Weight:    123.3 kg  Height:    5\' 9"  (1.753 m)    Constitutional: NAD, anxious  Eyes: PERTLA, lids and conjunctivae normal ENMT: Mucous membranes are moist. Posterior pharynx clear of any exudate or lesions.   Neck: supple, no masses  Respiratory:  no wheezing, no crackles. No accessory muscle use.  Cardiovascular: S1 & S2 heard, regular rate and rhythm. No extremity edema.   Abdomen: No distension, no tenderness, soft. Bowel sounds  active.  Musculoskeletal: no clubbing / cyanosis. No joint deformity upper and lower extremities.   Skin: no significant rashes, lesions, ulcers. Warm, dry, well-perfused. Neurologic: CN 2-12 grossly intact. Moving all extremities. Alert and oriented.  Psychiatric: Anxious. Tearful. Cooperative.    Labs and Imaging on Admission: I have personally reviewed following labs and imaging studies  CBC: Recent Labs  Lab 03/09/21 1333 03/12/21 1329  WBC 6.9 8.0  NEUTROABS  --  4.8  HGB 14.5 15.4*  HCT 43.5 45.3  MCV 82.2 81.8  PLT 226 893   Basic Metabolic Panel: Recent Labs  Lab 03/09/21 1333 03/12/21 1329  NA 135 132*  K 4.2 3.9  CL 102 98  CO2 21* 21*  GLUCOSE 281* 272*  BUN 13 17  CREATININE 1.01* 1.04*  CALCIUM 9.3 9.8   GFR: Estimated Creatinine Clearance: 88.9 mL/min (A) (by C-G formula based on SCr of 1.04 mg/dL (H)). Liver Function Tests: Recent Labs  Lab 03/12/21 1329  AST 49*  ALT 38  ALKPHOS 69  BILITOT 0.4  PROT 8.3*  ALBUMIN 4.3   No results for input(s): LIPASE, AMYLASE in the last 168 hours. No  results for input(s): AMMONIA in the last 168 hours. Coagulation Profile: No results for input(s): INR, PROTIME in the last 168 hours. Cardiac Enzymes: No results for input(s): CKTOTAL, CKMB, CKMBINDEX, TROPONINI in the last 168 hours. BNP (last 3 results) No results for input(s): PROBNP in the last 8760 hours. HbA1C: No results for input(s): HGBA1C in the last 72 hours. CBG: Recent Labs  Lab 03/12/21 2129  GLUCAP 270*   Lipid Profile: No results for input(s): CHOL, HDL, LDLCALC, TRIG, CHOLHDL, LDLDIRECT in the last 72 hours. Thyroid Function Tests: No results for input(s): TSH, T4TOTAL, FREET4, T3FREE, THYROIDAB in the last 72 hours. Anemia Panel: No results for input(s): VITAMINB12, FOLATE, FERRITIN, TIBC, IRON, RETICCTPCT in the last 72 hours. Urine analysis:    Component Value Date/Time   COLORURINE YELLOW 01/14/2021 [place] 01/14/2021 1158   LABSPEC 1.010 01/14/2021 1158   PHURINE 5.5 01/14/2021 1158   GLUCOSEU >=500 (A) 01/14/2021 1158   HGBUR NEGATIVE 01/14/2021 1158   BILIRUBINUR NEGATIVE 01/14/2021 1158   KETONESUR NEGATIVE 01/14/2021 1158   PROTEINUR NEGATIVE 01/14/2021 1158   NITRITE NEGATIVE 01/14/2021 1158   LEUKOCYTESUR NEGATIVE 01/14/2021 1158   Sepsis Labs: @LABRCNTIP (procalcitonin:4,lacticidven:4) ) Recent Results (from the past 240 hour(s))  Resp Panel by RT-PCR (Flu A&B, Covid) Nasopharyngeal Swab     Status: None   Collection Time: 03/12/21  5:27 PM   Specimen: Nasopharyngeal Swab; Nasopharyngeal(NP) swabs in vial transport medium  Result Value Ref Range Status   SARS Coronavirus 2 by RT PCR NEGATIVE NEGATIVE Final    Comment: (NOTE) SARS-CoV-2 target nucleic acids are NOT DETECTED.  The SARS-CoV-2 RNA is generally detectable in upper respiratory specimens during the acute phase of infection. The lowest concentration of SARS-CoV-2 viral copies this assay can detect is 138 copies/mL. A negative result does not preclude  SARS-Cov-2 infection and should not be used as the sole basis for treatment or other patient management decisions. A negative result may occur with  improper specimen collection/handling, submission of specimen other than nasopharyngeal swab, presence of viral mutation(s) within the areas targeted by this assay, and inadequate number of viral copies(<138 copies/mL). A negative result must be combined with clinical observations, patient history, and epidemiological information. The expected result is Negative.  Fact Sheet for Patients:  EntrepreneurPulse.com.au  Fact Sheet for Healthcare Providers:  IncredibleEmployment.be  This test is no t yet approved or cleared by the [place] and  has been authorized for detection and/or diagnosis of SARS-CoV-2 by FDA under an Emergency Use Authorization (EUA). This EUA will remain  in effect (meaning this test can be used) for the duration of the COVID-19 declaration under Section 564(b)(1) of the Act, 21 U.S.C.section 360bbb-3(b)(1), unless the authorization is terminated  or revoked sooner.       Influenza A by PCR NEGATIVE NEGATIVE Final   Influenza B by PCR NEGATIVE NEGATIVE Final    Comment: (NOTE) The Xpert Xpress SARS-CoV-2/FLU/RSV plus assay is intended as an aid in the diagnosis of influenza from Nasopharyngeal swab specimens and should not be used as a sole basis for treatment. Nasal washings and aspirates are unacceptable for Xpert Xpress SARS-CoV-2/FLU/RSV testing.  Fact Sheet for Patients: EntrepreneurPulse.com.au  Fact Sheet for Healthcare Providers: IncredibleEmployment.be  This test is not yet approved or cleared by the [place] FDA and has been authorized for detection and/or diagnosis of SARS-CoV-2 by FDA under an Emergency Use Authorization (EUA). This EUA will remain in effect (meaning this test can be used) for the duration of  the COVID-19 declaration under Section 564(b)(1) of the Act, 21 U.S.C. section 360bbb-3(b)(1), unless the authorization is terminated or revoked.  Performed at [facility], [address] [place], [place] [address]      Radiological Exams on Admission: CT Angio Chest PE W/Cm &/Or Wo Cm  Result Date: 03/12/2021 CLINICAL DATA:  Concern for pulmonary embolism.  Positive D-dimer. EXAM: CT ANGIOGRAPHY CHEST WITH CONTRAST TECHNIQUE: Multidetector CT imaging of the chest was performed using the standard protocol during bolus administration of intravenous contrast. Multiplanar CT image reconstructions and MIPs were obtained to evaluate the vascular anatomy. RADIATION DOSE REDUCTION: This exam was performed according to the departmental dose-optimization program which includes automated exposure control, adjustment of the mA and/or kV according to patient size and/or use of iterative reconstruction technique. CONTRAST:  173mL OMNIPAQUE IOHEXOL 350 MG/ML SOLN COMPARISON:  None. FINDINGS: Cardiovascular: No filling defects within the pulmonary arteries to suggest acute pulmonary embolism. Coronary artery calcification and aortic atherosclerotic calcification. Mediastinum/Nodes: No enlarged mediastinal, hilar, or axillary lymph nodes. Thyroid gland, trachea, and esophagus demonstrate no significant findings. Lungs/Pleura: Lungs are clear. No pleural effusion or pneumothorax. Upper Abdomen: No acute abnormality. Musculoskeletal: No chest wall abnormality. No acute or significant osseous findings. Review of the MIP images confirms the above findings. IMPRESSION: 1. No acute pulmonary embolism. 2. No acute pulmonary parenchymal findings. 3. Coronary artery calcification and Aortic Atherosclerosis (ICD10-I70.0). Electronically Signed   By: Suzy Bouchard M.D.   On: 03/12/2021 16:00   DG Chest Portable 1 View  Result Date: 03/12/2021 CLINICAL DATA:  Chest pain and shortness of breath for the past 3  days. EXAM: PORTABLE CHEST 1 VIEW COMPARISON:  03/09/2021 FINDINGS: Normal sized heart. Clear lungs with normal vascularity. Surgical absence of the distal right clavicle. IMPRESSION: No acute abnormality. Electronically Signed   By: Claudie Revering M.D.   On: 03/12/2021 14:37    EKG: Independently reviewed. Sinus tachycardia, rate 114.   Assessment/Plan   1. Chest pain; CAD  - Pt with hx of coronary angioplasty with 4 drug-eluting stents in 2010 and moderate CAD with patent stents in March 2022 p/w 3 days of chest pain  - EKG with ST, rate 114  - Troponin 3 then 4; d-dimer 0.73  - CTA neg for PE or acute pulmonary  disease  - She reports mild improvement after NTG x3  - She is very anxious and wonders if sxs could be from anxiety  - Continue cardiac monitoring, repeat EKG, treat anxiety, continue antiplatelets, nitrates, statin, and beta-blocker, request routine cardiology consult    2. Insulin-dependent DM  - A1c was 11.2% in Sept 2022  - She uses insulin pump at home but took it off at [facility] and use basal and SSI for now    3. Hypertension  - Continue clonidine, metoprolol, lisinopril-HCTZ    4. CKD II  - SCr 1.04 on admission, similar to priors    5. Anxiety  - Has been off medications for this recently and experiencing severe anxiety  - She was given Ativan in ED with improvement, plan to continue low-dose Xanax as needed for now, encourage outpatient follow-up    6. Chronic left shoulder pain  - Recent MRI with supraspinatus and infraspinatus tendinitis and possible adhesive capsulitis  - Scheduled for surgery on 04/02/21    DVT prophylaxis: Lovenox  Code Status: Full  Level of Care: Level of care: Telemetry Cardiac Family Communication: none present  Disposition Plan:  Patient is from: Home  Anticipated d/c is to: home  Anticipated d/c date is: 1/13 or 03/14/21  Patient currently: pending cardiac monitoring, repeat EKG, repeat troponin, lipase  Consults  called: Message sent to cardiology with request for routine consult  Admission status: Observation     Vianne Bulls, MD [facility]  03/12/2021, 10:43 PM

## 2021-03-12 NOTE — Progress Notes (Signed)
[facility] Transfer Acceptance Note  Transferring from: [facility] Transferring to: [facility] Status: Observation Bed: Cardiac telemetry Transferring provider: Sherrill Raring, PA-C/EDP  51 year old female with extensive past medical history, significant for MI, CAD s/p stents, PAD s/p stents, type I DM, HTN, HLD, former smoker, s/p cardiac cath 05/29/2020 (report in [place]) designated for medical management of CAD, presented to the ED on 03/12/2021 with complaints of left-sided chest pain without radiation x3 days.  Feels pressure-like, associated with dyspnea, positional with worse when leaning forward.  Tachycardic in ED up to 126 bpm, mildly hypertensive but other vital signs stable.  Lab work significant for sodium 132, glucose 272, high-sensitivity troponin x1 negative, D-dimer 0.73 which was followed up with a CTA chest: Negative for PE or acute pulmonary parenchymal findings.  Did show coronary artery calcification and aortic atherosclerosis.  EKG showed sinus tachycardia with Q waves in leads V1-V3 but otherwise no acute abnormalities.  Flu panel and COVID-19 RT-PCR pending.  Chest pain reportedly improved somewhat with sublingual nitroglycerin.  As per discussion with EDP, does not feel GI etiology or pericarditis stop on the differentials.  No history of chest wall trauma.  Assessment and plan: Chest pain, unspecified: Given cardiac history and extensive cardiac risk factors, need to rule out cardiac etiology for chest pain.  Recommended trending high-sensitivity troponins.  Accepted patient for transfer.  However on discussing with [facility], currently without beds at [facility].  EDP will remain attending in the ED and care for patient as deemed appropriate until transfer to [facility].  Vernell Leep, MD,  FACP, [facility], [facility], [facility] (Care Management Physician Certified) [facility]  To contact the attending provider between  7A-7P or the covering provider during after hours 7P-7A, please log into the web site [email or web address] and access using universal [place] password for that web site. If you do not have the password, please call the hospital operator.

## 2021-03-12 NOTE — ED Notes (Signed)
PT report given to [place], RN

## 2021-03-13 DIAGNOSIS — R072 Precordial pain: Secondary | ICD-10-CM

## 2021-03-13 LAB — GLUCOSE, CAPILLARY
Glucose-Capillary: 199 mg/dL — ABNORMAL HIGH (ref 70–99)
Glucose-Capillary: 200 mg/dL — ABNORMAL HIGH (ref 70–99)
Glucose-Capillary: 210 mg/dL — ABNORMAL HIGH (ref 70–99)
Glucose-Capillary: 222 mg/dL — ABNORMAL HIGH (ref 70–99)
Glucose-Capillary: 310 mg/dL — ABNORMAL HIGH (ref 70–99)
Glucose-Capillary: 327 mg/dL — ABNORMAL HIGH (ref 70–99)

## 2021-03-13 LAB — LIPID PANEL
Cholesterol: 131 mg/dL (ref 0–200)
HDL: 27 mg/dL — ABNORMAL LOW (ref >40)
LDL Cholesterol: UNDETERMINED mg/dL (ref 0–99)
Total CHOL/HDL Ratio: 4.9 RATIO
Triglycerides: 653 mg/dL — ABNORMAL HIGH (ref <150)
VLDL: UNDETERMINED mg/dL (ref 0–40)

## 2021-03-13 LAB — HIV ANTIBODY (ROUTINE TESTING W REFLEX): HIV Screen 4th Generation wRfx: NONREACTIVE

## 2021-03-13 LAB — HEMOGLOBIN A1C
Hgb A1c MFr Bld: 11 % — ABNORMAL HIGH (ref 4.8–5.6)
Mean Plasma Glucose: 269 mg/dL

## 2021-03-13 LAB — LDL CHOLESTEROL, DIRECT: Direct LDL: 39.9 mg/dL (ref 0–99)

## 2021-03-13 MED ORDER — VENLAFAXINE HCL ER 75 MG PO CP24
225.0000 mg | ORAL_CAPSULE | Freq: Every day | ORAL | Status: DC
  Administered 2021-03-13: 225 mg via ORAL
  Filled 2021-03-13: qty 3

## 2021-03-13 MED ORDER — SENNOSIDES-DOCUSATE SODIUM 8.6-50 MG PO TABS: 1.0000 | ORAL_TABLET | Freq: Two times a day (BID) | ORAL | Status: DC | PRN

## 2021-03-13 MED ORDER — TRAZODONE HCL 50 MG PO TABS: 50.0000 mg | ORAL_TABLET | Freq: Every evening | ORAL | Status: DC | PRN

## 2021-03-13 MED ORDER — OXYCODONE-ACETAMINOPHEN 5-325 MG PO TABS
1.0000 | ORAL_TABLET | Freq: Four times a day (QID) | ORAL | Status: DC | PRN
Start: 2021-03-13 — End: 2021-03-13

## 2021-03-13 MED ORDER — MIRTAZAPINE 7.5 MG PO TABS: 45.0000 mg | ORAL_TABLET | Freq: Every day | ORAL | Status: DC

## 2021-03-13 MED ORDER — NITROGLYCERIN 0.4 MG SL SUBL: 0.4000 mg | SUBLINGUAL_TABLET | SUBLINGUAL | Status: DC | PRN

## 2021-03-13 MED ORDER — HYDROXYZINE HCL 10 MG PO TABS: 10.0000 mg | ORAL_TABLET | Freq: Three times a day (TID) | ORAL | Status: DC | PRN

## 2021-03-13 MED ORDER — TIZANIDINE HCL 2 MG PO TABS
6.0000 mg | ORAL_TABLET | Freq: Three times a day (TID) | ORAL | Status: DC | PRN
  Filled 2021-03-13: qty 3

## 2021-03-13 MED ORDER — ALBUTEROL SULFATE (2.5 MG/3ML) 0.083% IN NEBU: 3.0000 mL | INHALATION_SOLUTION | RESPIRATORY_TRACT | Status: DC | PRN

## 2021-03-13 NOTE — Consult Note (Signed)
Cardiology Consultation:   Patient ID: Jean Calderon MRN: [id number]; DOB: 12-27-1970  Admit date: 03/12/2021 Date of Consult: 03/13/2021  PCP:  Azzie Glatter, FNP   [facility] Providers Cardiologist:  None   {  Patient Profile:   Jean Calderon is a 51 y.o. female with a history of CAD with prior stenting to LAD in 2010 in [place], PAD, hypertension, hyperlipidemia, type 2 diabetes with an insulin pump, and former tobacco use who is being seen 03/13/2021 for the evaluation of chest pain at the request of Dr. Lupita Leash.  History of Present Illness:   Jean Calderon is a 51 year old female with the above history.  Patient has a history of CAD.she suffered a MI in 2010 while living in [place] and reportedly had 4 stents placed at that time.  Per review of chart and care everywhere, it sounds like she had some stenting to her LAD but she is unsure if all 4 stents were to the LAD. Last cardiac catheterization in 04/2020 at [facility] in [place] showed patent stents with otherwise mild nonobstructive disease.  Continued medical therapy was recommended.  Patient was recently referred to Cardiology at [facility] in 12/2020 to establish care after moving to the area.  At that time, she also reported that she had a history of PAD and has had stents placed in her legs a couple of years ago while in [place].  She was doing well at that time and denied any cardiac symptoms.  Patient presented to the ED on 03/09/2021 with chest pain, shortness of breath, and nausea. EKG showed no acute ischemic changes. High-sensitivity troponin negative x2. However, she left before being seen by a provider. She returned to the ED on 03/12/2021 again with chest pain and shortness of breath. Upon arrival to the ED, patient tachycardic and hypertensive. EKG showed showed sinus tachycardia, rate 114 bpm, with no acute ST/T changes. High-sensitivity troponin negative x3. D-dimer elevated at 0.73. Chest  x-ray showed no acute findings. Chest CTA was negative for PE. WBC 8.0, Hgb 15.4, Plts 225. Na 132, K 3.9, Glucose 272, Cr 1.04, BUN 17. AST 49 but otherwise LFTs normal. Patient was admitted for further evaluation and Cardiology was consulted.  At the time of this evaluation, patient resting comfortably in no acute distress eating lunch.  She states she was in her usual state of health until 03/09/2021 when she developed rather sudden onset of shortness of breath that occurred while at rest.  This is when she decided to go to the ED.  She denies having any shortness of breath at this time.  She left without being seen due to the wait followed up with her PCP who stated shortness of breath could be result of COVID infection a year ago.  She then states she had worsening shortness of breath on the evening of 03/11/2020 which was followed by diffuse chest pressure that she describes as something sitting on her chest as well as palpitations, lightheadedness, vomiting.  The more we discussed this event more it sounds like she may have had a panic attack.  She states she woke up with a nightmare and that is when her shortness of breath began.  She notes she has significant anxiety at the and it sounds like she got anxious after becoming short of breath and the rest of the symptoms followed.  However, she has been doing improve after 3 doses of sublingual nitro in the ED.  Patient denies any exertional  symptoms.  She states she has 14 steps in her house and has to go up and down the small times throughout the day without any issues.  She states symptoms are not similar to the symptoms she had prior to her heart attack in 2010 (presenting symptom at that time was severe heartburn).  She denies any orthopnea.  She she states she will will occasionally wake up feeling short of this of breath but morning this is more due to anxiety and suspected sleep apnea.  She also reports a long history of insomnia and states she has not  slept for the past 2 days.  She denies any recent fevers or illnesses.    She is currently chest pain-free and she states her shortness of breath has resolved as well.  She is a former smoker but states she quit around 2015.  No other drug use.  She is adopted so she does not know much of her family history but she states her bio mom biological mother did die from some heart problem but it may have been from drug use.  Her biological sister also has some heart disease and reportedly has a pacemaker.  Past Medical History:  Diagnosis Date   CAD (coronary artery disease)    CKD (chronic kidney disease), stage II    DM (diabetes mellitus) (HCC)    HTN (hypertension)    Malignant hyperthermia    Neuropathy     Past Surgical History:  Procedure Laterality Date   APPENDECTOMY     BACK SURGERY     CESAREAN SECTION     CORONARY ANGIOPLASTY WITH STENT PLACEMENT     ROTATOR CUFF REPAIR       Home Medications:  Prior to Admission medications   Medication Sig Start Date End Date Taking? Authorizing Provider  albuterol (VENTOLIN HFA) 108 (90 Base) MCG/ACT inhaler 2 puffs every 4 (four) hours as needed for shortness of breath or wheezing. 10/01/20  Yes [provider]  atorvastatin (LIPITOR) 40 MG tablet Take 40 mg by mouth daily. 09/01/20  Yes [provider]  cloNIDine (CATAPRES) 0.1 MG tablet Take 0.1 mg by mouth 2 (two) times daily. 01/08/21  Yes [provider]  clopidogrel (PLAVIX) 75 MG tablet Take 75 mg by mouth daily. 11/25/20  Yes [provider]  EQ SENNA-S 8.6-50 MG tablet Take 1 tablet by mouth 2 (two) times daily as needed for constipation. 02/07/21  Yes [provider]  Estradiol-Norethindrone Acet 0.5-0.1 MG tablet Take 1 tablet by mouth daily. 12/18/20  Yes [provider]  hydrOXYzine (ATARAX/VISTARIL) 10 MG tablet Take 10 mg by mouth at bedtime as needed for itching. 07/16/20  Yes [provider]  Insulin Disposable Pump  (OMNIPOD 5 G6 POD, GEN 5,) MISC SMARTSIG:SUB-Q Every Other Day 12/02/20  Yes [provider]  insulin lispro (HUMALOG) 100 UNIT/ML injection 200 Units See admin instructions. 200 units via insulin pump every 2- 3 days 10/11/19  Yes [provider]  isosorbide mononitrate (IMDUR) 30 MG 24 hr tablet Take 30 mg by mouth daily. 11/25/20  Yes [provider]  JARDIANCE 25 MG TABS tablet Take 25 mg by mouth daily. 12/14/20  Yes [provider]  lisinopril-hydrochlorothiazide (ZESTORETIC) 20-12.5 MG tablet Take 1 tablet by mouth daily. 12/04/20  Yes [provider]  LORazepam (ATIVAN) 0.5 MG tablet Take 0.5 mg by mouth every 8 (eight) hours as needed for sleep. 06/18/20  Yes [provider]  metoprolol succinate (TOPROL-XL) 100 MG  24 hr tablet Take 100 mg by mouth daily. 12/04/20  Yes [provider]  mirtazapine (REMERON) 45 MG tablet Take 45 mg by mouth at bedtime. 12/18/20  Yes [provider]  nitroGLYCERIN (NITROSTAT) 0.4 MG SL tablet Place 0.4 mg under the tongue every 5 (five) minutes as needed for chest pain. 01/05/21  Yes [provider]  oxyCODONE-acetaminophen (PERCOCET/ROXICET) 5-325 MG tablet Take 1 tablet by mouth every 6 (six) hours as needed for severe pain. 10/16/20  Yes [provider]  tizanidine (ZANAFLEX) 6 MG capsule Take 6 mg by mouth 3 (three) times daily as needed for muscle spasms. 12/03/20  Yes [provider]  topiramate (TOPAMAX) 25 MG tablet Take 25 mg by mouth 2 (two) times daily as needed (headaches). 10/08/20  Yes [provider]  Venlafaxine HCl 225 MG TB24 Take 225 mg by mouth daily. 12/22/20  Yes [provider]  Continuous Blood Gluc Sensor (DEXCOM G6 SENSOR) MISC by Misc.(Non-Drug; Combo Route) route    [provider]    Inpatient Medications: Scheduled Meds:  aspirin EC  81 mg Oral Daily   atorvastatin  40 mg Oral Daily   cloNIDine  0.1 mg Oral BID    clopidogrel  75 mg Oral Daily   enoxaparin (LOVENOX) injection  60 mg Subcutaneous Q24H   lisinopril  20 mg Oral Daily   And   hydrochlorothiazide  12.5 mg Oral Daily   insulin aspart  0-15 Units Subcutaneous Q4H   insulin glargine-yfgn  15 Units Subcutaneous BID   isosorbide mononitrate  30 mg Oral Daily   metoprolol succinate  100 mg Oral Daily   mirtazapine  45 mg Oral QHS   topiramate  25 mg Oral BID   venlafaxine XR  225 mg Oral Daily   Continuous Infusions:  PRN Meds: acetaminophen, albuterol, ALPRAZolam, alum & mag hydroxide-simeth, hydrOXYzine, nitroGLYCERIN, ondansetron (ZOFRAN) IV, oxyCODONE-acetaminophen, senna-docusate, tiZANidine, traZODone, zolpidem  Allergies:    Allergies  Allergen Reactions   Tramadol     Other reaction(s): Mental Status Changes (intolerance) Caused Hallucinations   Nsaids Other (See Comments)    Cannot take because she's on Plavix.   Other reaction(s): Bleeding (intolerance) Cannot take because she's on Plavix.    Sulfamethoxazole-Trimethoprim Nausea And Vomiting    History of Colitis   Ibuprofen     Not an allergy. Pt stated she can't take it because of current use of ASA and Plavix    Social History:   Social History   Socioeconomic History   Marital status: Divorced    Spouse name: Not on file   Number of children: Not on file   Years of education: Not on file   Highest education level: Not on file  Occupational History   Not on file  Tobacco Use   Smoking status: Former    Types: Cigarettes   Smokeless tobacco: Never  Vaping Use   Vaping Use: Never used  Substance and Sexual Activity   Alcohol use: Not Currently   Drug use: Never   Sexual activity: Not on file  Other Topics Concern   Not on file  Social History Narrative   Not on file   Social Determinants of Health   Financial Resource Strain: Not on file  Food Insecurity: Not on file  Transportation Needs: Not on file  Physical Activity: Not on file   Stress: Not on file  Social Connections: Not on file  Intimate Partner Violence: Not on file    Family History:  Family History  Problem Relation Age of Onset   Heart disease Mother    Heart disease Sister      ROS:  Please see the history of present illness.  All other ROS reviewed and negative.     Physical Exam/Data:   Vitals:   03/13/21 0300 03/13/21 0745 03/13/21 0958 03/13/21 1120  BP: 112/73 117/72  123/72  Pulse: 100 90 88 90  Resp: 20 16  13   Temp: 98.5 F (36.9 C) 98.5 F (36.9 C)  98.4 F (36.9 C)  TempSrc: Oral Oral  Oral  SpO2: 94% 94%  95%  Weight:      Height:        Intake/Output Summary (Last 24 hours) at 03/13/2021 1523 Last data filed at 03/12/2021 2200 Gross per 24 hour  Intake 1514 ml  Output --  Net 1514 ml   Last 3 Weights 03/12/2021 03/12/2021 03/09/2021  Weight (lbs) 271 lb 13.2 oz 270 lb 270 lb 1 oz  Weight (kg) 123.3 kg 122.471 kg 122.5 kg     Body mass index is 40.14 kg/m.  General: 51 y.o. morbidly obese female resting comfortably in no acute distress. HEENT: Normocephalic and atraumatic. Sclera clear. intact. Neck: Supple. No carotid bruits. JVD difficult to assess due to body habitus. Heart: RRR. Distinct S1 and S2. No murmurs, gallops, or rubs. Radial and distal pedal pulses 2+ and equal bilaterally. Lungs: No increased work of breathing. Clear to ausculation bilaterally. No wheezes, rhonchi, or rales.  Abdomen: Soft, non-distended, and non-tender to palpation. Extremities: No lower extremity edema.    Skin: Warm and dry. Neuro: Alert and oriented x3. No focal deficits. Psych: Normal affect. Responds appropriately.  EKG:  The EKG was personally reviewed and demonstrates:  Sinus tachycardia, rate 114 bpm, with possible Q waves in leads V1-V3 bu no acute ST/T changes.  Telemetry:  Telemetry was personally reviewed and demonstrates:  Sinus rhythm with rates in the 90s to low 100s.  Relevant CV Studies:  Cardiac Catheterization  05/29/2020 ([facility]): Findings: - Dominant vessel: the right coronary artery  - Left main: 0%  - LAD: 50% prox, 30% mid, 40% distal, Diagonal: 1: 30%. Stents are open  - Ramus: 30%  - Circumflex: 30% prox, 30% mid, 30% distal, OM: 1st: 30%, LPDA: 0%  - RCA: 30% prox, 30% mid, 30% distal, RPDA 0%, RPLB 0%  - Bypass Graft(s): none    The ejection fraction is 55-65% by visual estimate.   The left ventricular systolic function is normal.   LV end diastolic pressure is normal.    Laboratory Data:  High Sensitivity Troponin:   Recent Labs  Lab 03/09/21 1333 03/12/21 1329 03/12/21 1904 03/12/21 2119  TROPONINIHS 2 3 4 6      Chemistry Recent Labs  Lab 03/09/21 1333 03/12/21 1329  NA 135 132*  K 4.2 3.9  CL 102 98  CO2 21* 21*  GLUCOSE 281* 272*  BUN 13 17  CREATININE 1.01* 1.04*  CALCIUM 9.3 9.8  GFRNONAA >60 >60  ANIONGAP 12 13    Recent Labs  Lab 03/12/21 1329  PROT 8.3*  ALBUMIN 4.3  AST 49*  ALT 38  ALKPHOS 69  BILITOT 0.4   Lipids  Recent Labs  Lab 03/13/21 0136  CHOL 131  TRIG 653*  HDL 27*  LDLCALC UNABLE TO CALCULATE IF TRIGLYCERIDE OVER 400 mg/dL  CHOLHDL 4.9    Hematology Recent Labs  Lab 03/09/21 1333 03/12/21 1329  WBC 6.9 8.0  RBC 5.29* 5.54*  HGB 14.5 15.4*  HCT 43.5 45.3  MCV 82.2 81.8  MCH 27.4 27.8  MCHC 33.3 34.0  RDW 13.4 13.7  PLT 226 255   Thyroid No results for input(s): TSH, FREET4 in the last 168 hours.  BNPNo results for input(s): BNP, PROBNP in the last 168 hours.  DDimer  Recent Labs  Lab 03/12/21 1329  DDIMER 0.73*     Radiology/Studies:  CT Angio Chest PE W/Cm &/Or Wo Cm  Result Date: 03/12/2021 CLINICAL DATA:  Concern for pulmonary embolism.  Positive D-dimer. EXAM: CT ANGIOGRAPHY CHEST WITH CONTRAST TECHNIQUE: Multidetector CT imaging of the chest was performed using the standard protocol during bolus administration of intravenous contrast. Multiplanar CT image reconstructions and  MIPs were obtained to evaluate the vascular anatomy. RADIATION DOSE REDUCTION: This exam was performed according to the departmental dose-optimization program which includes automated exposure control, adjustment of the mA and/or kV according to patient size and/or use of iterative reconstruction technique. CONTRAST:  152mL OMNIPAQUE IOHEXOL 350 MG/ML SOLN COMPARISON:  None. FINDINGS: Cardiovascular: No filling defects within the pulmonary arteries to suggest acute pulmonary embolism. Coronary artery calcification and aortic atherosclerotic calcification. Mediastinum/Nodes: No enlarged mediastinal, hilar, or axillary lymph nodes. Thyroid gland, trachea, and esophagus demonstrate no significant findings. Lungs/Pleura: Lungs are clear. No pleural effusion or pneumothorax. Upper Abdomen: No acute abnormality. Musculoskeletal: No chest wall abnormality. No acute or significant osseous findings. Review of the MIP images confirms the above findings. IMPRESSION: 1. No acute pulmonary embolism. 2. No acute pulmonary parenchymal findings. 3. Coronary artery calcification and Aortic Atherosclerosis (ICD10-I70.0). Electronically Signed   By: Suzy Bouchard M.D.   On: 03/12/2021 16:00   DG Chest Portable 1 View  Result Date: 03/12/2021 CLINICAL DATA:  Chest pain and shortness of breath for the past 3 days. EXAM: PORTABLE CHEST 1 VIEW COMPARISON:  03/09/2021 FINDINGS: Normal sized heart. Clear lungs with normal vascularity. Surgical absence of the distal right clavicle. IMPRESSION: No acute abnormality. Electronically Signed   By: Claudie Revering M.D.   On: 03/12/2021 14:37     Assessment and Plan:   Chest Pain Patient has a history of CAD with prior MI in 2010. She reportedly had 4 stents placed at that time. Last cath in 04/2020 showed patent stents with otherwise only mild non-obstructive CAD. She now presents with chest pain and shortness of breath.  EKG shows no acute ischemic changes.  High-sensitivity troponin  negative x3.  D-dimer was mildly elevated but chest CTA negative for PE.  It sounds like she may have had a panic attack which caused her symptoms.  She denies any exertional chest pain.  She is currently chest pain-free.  She can likely be discharged home with outpatient follow-up with her primary Cardiologist.  She does have upcoming shoulder surgery planned has routine Echo planned in the next 1-2 weeks. Will defer need for ischemic evaluation prior to surgery to primary Cardiologist - do not think this needs to be done as an inpatient. Continue DAPT with Aspirin and Plavix. Continue high-intensity statin.  Hypertension BP well controlled. - Current medications: Lisinopril-HCTZ 20-12.5mg  daily, Toprol-XL 100mg  daily, Imdur 30mg  daily, Clonidine 0.1mg  twice daily.   Hyperlipidemia Lipid panel today: Total Cholesterol 131, Triglycerides 653, HDL 27. Direct LDL 39.9. - Continue home Lipitor 40mg  daily. Consider adding Vascepa for triglycerides as outpatient.  Type 2 Diabetes Mellitus - Uncontrolled Hemoglobin A1c 11.0% this admission. - Management per primary team.  Suspect Sleep Apnea Patient has a history  of sleep apnea listed in her chart in [place] but she denies this.  She is unaware of any significant snoring at night but does not feel well rested in the morning (she does have a history of insomnia though). BMI 40. Suspect she likely has sleep apnea. Recommended discussing outpatient sleep study with PCP or primary Cardiologist.  Anxiety Insomnia Management per primary team.   Risk Assessment/Risk Scores:   HEAR Score (for undifferentiated chest pain):  HEAR Score: 4{   For questions or updates, please contact [place] Please consult [email or web address] for contact info under    Signed, Darreld Mclean, PA-C  03/13/2021 3:23 PM

## 2021-03-13 NOTE — Plan of Care (Signed)

## 2021-03-13 NOTE — Progress Notes (Signed)
Nursing Dc note  Ccmd notified of dc order. Piv dcd. Site unremarkable.patient alert and oriented, verbalized understanding of dc instructions.all belongings given to patient. Patient to be transported to the [facility], awaiting spouse to take her home.

## 2021-03-13 NOTE — Progress Notes (Addendum)
Inpatient Diabetes Program Recommendations  AACE/ADA: New Consensus Statement on Inpatient Glycemic Control (2015)  Target Ranges:  Prepandial:   less than 140 mg/dL      Peak postprandial:   less than 180 mg/dL (1-2 hours)      Critically ill patients:  140 - 180 mg/dL   Lab Results  Component Value Date   GLUCAP 199 (H) 03/13/2021   HGBA1C 11.0 (H) 03/13/2021    Review of Glycemic Control  Latest Reference Range & Units 03/13/21 03:53 03/13/21 07:48 03/13/21 11:21  Glucose-Capillary 70 - 99 mg/dL 210 (H) 200 (H) 199 (H)  (H): Data is abnormally high Diabetes history: Type 2 DM Outpatient Diabetes medications: Omnipod- Humalog, Jardiance 25 mg QD Current orders for Inpatient glycemic control: Novolog 0-15 units Q4H, Semglee 15 units BID  Inpatient Diabetes Program Recommendations:     Outpatient insulin pump settings are as follows: Basal insulin  0000-1200 3.0 units/hour 1200-0000 4.0 units/hour Total daily basal insulin: 84 units/24 hours  Carb Coverage 1:3 1 unit for every 3 grams of carbohydrates  Insulin Sensitivity 1:12 1 unit drops blood glucose 12 mg/dl  Target Glucose Goals 0000-0000 110 mg/dl  Spoke with patient and significant other regarding outpatient diabetes management. Patient recently had initial consult with Dr Tamala Julian, outpatient endocrinology. At that visit attempted to start Ozempic, however patient could not tolerate. Has follow up visit scheduled. Insulin pump was removed on presentation due to MRI, however initial glucose serum was 272 mg/dL. Patient states, "I am doing all that I need to do and I have scar tissue." Patient denies removing insulin pump for other reasons and reports bolusing and that boluses usually will bring down blood sugar.  Reviewed patient's current A1c of 11.0%. Explained what a A1c is and what it measures. Also reviewed goal A1c with patient, importance of good glucose control @ home, and blood sugar goals. Reviewed patho of DM,  insulin resistance, scar tissue with insulin pump, interventions, possible solutions, discussion with bolusing, when to check CBGs, when to call MD, vascular changes, impact with chest pain and other co-morbidities. Patient uses Dexcom and plans to report values at next visit.  Denies consuming sugary beverages. Is not interested in speaking further regarding food. Has no questions at this time. Plans to follow up with outpatient endocrinology.  Thanks, Bronson Curb, MSN, RNC-OB Diabetes Coordinator [phone] (8a-5p)

## 2021-03-13 NOTE — Discharge Summary (Signed)
Physician Discharge Summary  Jean Calderon TOI:[id number] DOB: December 30, 1970 DOA: 03/12/2021  PCP: Azzie Glatter, FNP  Admit date: 03/12/2021 Discharge date: 03/13/2021  Admitted From: home Disposition:  home  Recommendations for Outpatient Follow-up:  Follow up with PCP in 1-2 weeks Follow-up with your cardiology at [facility] Please obtain BMP/CBC in one week   Home Health:no  Equipment/Devices: none  Discharge Condition: Stable Code Status:   Code Status: Full Code Diet recommendation:  Diet Order             Diet Carb Modified Fluid consistency: Thin; Room service appropriate? Yes  Diet effective now                    Brief/Interim Summary: 51 y.o. female with PMH of  CAD,mild renal insufficiency, insulin-dependent diabetes mellitus, hypertension, anxiety, and chronic left shoulder pain,  p/w for  evaluation of chest pain.On 03/09/2021 developed pain localized to the lower central chest, pressure like constant, sometimes worse when sitting up or when walking up stairs, associated with nausea but no vomiting, and associated with shortness of breath.  Intensity increased significantly  night before and she felt as though she could not catch her breath., while laying in bed.  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  She reports being off of her anxiety medications recently and has been suffering from severe anxiety insomnia was trying Ambien at home.   In the ED was afebrile vitals stable although tachycardic in 130s, EKG with sinus tachycardia CXR negative for acute findings.  Troponin was 3 and then 4.  D-dimer 0.73.-CTA chest negative for PE or acute pulmonary disease.  Lab with hyperglycemia slightly elevated AST given IV fluid sublingual nitro and transported to [facility] for further evaluation.  Patient was admitted serial troponins  negative seen by cardiology no further cardiac work-up indicated okay to discharge home suspecting panic attack.  Discharge Diagnoses:   Chest pain History of CAD: Serial troponins negative, given her presentation history of previous CAD cardiology consulted.  Continue aspirin Lipitor 40 Plavix, Imdur, metoprolol.  At this time no further cardiac work-up per cardiology patient follow-up with her cardiology at [facility].  Elevated D-dimer negative CTA for PE Insulin-dependent diabetes mellitus A1c 11.with uncontrolled hyperglycemia, on insulin pump at home but took it off at ED, monitor CBG with basal bolus insulin.  Patient needs to be compliant with her diet and will need to have close follow-up to adjust her insulin regimen with her endocrinologist as she is on insulin pump Recent Labs  Lab 03/12/21 2129 03/13/21 0002 03/13/21 0136 03/13/21 0353 03/13/21 0748  GLUCAP 270* 310*  --  210* 200*  HGBA1C  --   --  11.0*  --   --     CKD stage II creatinine stable  Hypertension blood pressure stable on clonidine metoprolol imdur  Anxiety disorder/insomnia: Patient endorsing severe anxiety recently and insomnia given Ativan in the ED with improvement, placed on low-dose Xanax (at home om ativan, remeron bedtime), she can resume her home meds upon discharge.  Advised to follow-up with PCP or psychiatry.  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 She is scheduled for surgery on 04/02/21   Class III Obesity:Patient's Body mass index is 40.14 kg/m. : Will benefit with PCP follow-up, weight loss  healthy lifestyle and  outpatient sleep evaluation.  Consults: cardiology  Subjective: Seen examined this morning.  Patient denies any chest pain.  She was hungry and angry that she was n.p.o.  She reports she has spoken to her Angelyn Punt at [facility] and planning to go there if she has chest pain  Discharge Exam: Vitals:    03/13/21 1120 03/13/21 1552  BP: 123/72 106/63  Pulse: 90 85  Resp: 13 20  Temp: 98.4 F (36.9 C) 98.5 F (36.9 C)  SpO2: 95% 96%   General: Pt is alert, awake, not in acute distress Cardiovascular: RRR, S1/S2 +, no rubs, no gallops Respiratory: CTA bilaterally, no wheezing, no rhonchi Abdominal: Soft, NT, ND, bowel sounds + Extremities: no edema, no cyanosis  Discharge Instructions  Discharge Instructions     Discharge instructions   Complete by: As directed    Please call call MD or return to ER for similar or worsening recurring problem that brought you to hospital or if any fever,nausea/vomiting,abdominal pain, uncontrolled pain, chest pain,  shortness of breath or any other alarming symptoms.  Please follow-up your doctor as instructed in a week time and call the office for appointment.  Please avoid alcohol, smoking, or any other illicit substance and maintain healthy habits including taking your regular medications as prescribed.  You were cared for by a hospitalist during your hospital stay. If you have any questions about your discharge medications or the care you received while you were in the hospital after you are discharged, you can call the unit and ask to speak with the hospitalist on call if the hospitalist that took care of you is not available.  Once you are discharged, your primary care physician will handle any further medical issues. Please note that NO REFILLS for any discharge medications will be authorized once you are discharged, as it is imperative that you return to your primary care physician (or establish a relationship with a primary care physician if you do not have one) for your aftercare needs so that they can reassess your need for medications and monitor your lab values   Increase activity slowly   Complete by: As directed       Allergies as of 03/13/2021       Reactions   Tramadol    Other reaction(s): Mental Status Changes  (intolerance) Caused Hallucinations   Nsaids Other (See Comments)   Cannot take because she's on Plavix.  Other reaction(s): Bleeding (intolerance) Cannot take because she's on Plavix.    Sulfamethoxazole-trimethoprim Nausea And Vomiting   History of Colitis   Ibuprofen    Not an allergy. Pt stated she can't take it because of current use of ASA and Plavix        Medication List     TAKE these medications    albuterol 108 (90 Base) MCG/ACT inhaler Commonly known as: VENTOLIN HFA 2 puffs every 4 (four) hours as needed for shortness of breath or wheezing.   atorvastatin 40 MG tablet Commonly known as: LIPITOR Take 40 mg by mouth daily.   cloNIDine 0.1 MG tablet Commonly known as: CATAPRES Take 0.1 mg by mouth 2 (two) times daily.   clopidogrel 75 MG tablet Commonly known as: PLAVIX Take 75 mg by mouth daily.   Dexcom G6 Sensor Misc by [employer].(Non-Drug; Combo Route) route   EQ Senna-S 8.6-50 MG tablet Generic drug: senna-docusate Take 1 tablet by mouth 2 (two) times daily as needed for constipation.   Estradiol-Norethindrone Acet 0.5-0.1 MG tablet Take 1 tablet  by mouth daily.   hydrOXYzine 10 MG tablet Commonly known as: ATARAX Take 10 mg by mouth at bedtime as needed for itching.   insulin lispro 100 UNIT/ML injection Commonly known as: HUMALOG 200 Units See admin instructions. 200 units via insulin pump every 2- 3 days   isosorbide mononitrate 30 MG 24 hr tablet Commonly known as: IMDUR Take 30 mg by mouth daily.   Jardiance 25 MG Tabs tablet Generic drug: empagliflozin Take 25 mg by mouth daily.   lisinopril-hydrochlorothiazide 20-12.5 MG tablet Commonly known as: ZESTORETIC Take 1 tablet by mouth daily.   LORazepam 0.5 MG tablet Commonly known as: ATIVAN Take 0.5 mg by mouth every 8 (eight) hours as needed for sleep.   metoprolol succinate 100 MG 24 hr tablet Commonly known as: TOPROL-XL Take 100 mg by mouth daily.   mirtazapine 45 MG  tablet Commonly known as: REMERON Take 45 mg by mouth at bedtime.   nitroGLYCERIN 0.4 MG SL tablet Commonly known as: NITROSTAT Place 0.4 mg under the tongue every 5 (five) minutes as needed for chest pain.   Omnipod 5 G6 Pod (Gen 5) Misc SMARTSIG:SUB-Q Every Other Day   oxyCODONE-acetaminophen 5-325 MG tablet Commonly known as: PERCOCET/ROXICET Take 1 tablet by mouth every 6 (six) hours as needed for severe pain.   tizanidine 6 MG capsule Commonly known as: ZANAFLEX Take 6 mg by mouth 3 (three) times daily as needed for muscle spasms.   topiramate 25 MG tablet Commonly known as: TOPAMAX Take 25 mg by mouth 2 (two) times daily as needed (headaches).   Venlafaxine HCl 225 MG Tb24 Take 225 mg by mouth daily.        Allergies  Allergen Reactions   Tramadol     Other reaction(s): Mental Status Changes (intolerance) Caused Hallucinations   Nsaids Other (See Comments)    Cannot take because she's on Plavix.   Other reaction(s): Bleeding (intolerance) Cannot take because she's on Plavix.    Sulfamethoxazole-Trimethoprim Nausea And Vomiting    History of Colitis   Ibuprofen     Not an allergy. Pt stated she can't take it because of current use of ASA and Plavix    The results of significant diagnostics from this hospitalization (including imaging, microbiology, ancillary and laboratory) are listed below for reference.    Microbiology: Recent Results (from the past 240 hour(s))  Resp Panel by RT-PCR (Flu A&B, Covid) Nasopharyngeal Swab     Status: None   Collection Time: 03/12/21  5:27 PM   Specimen: Nasopharyngeal Swab; Nasopharyngeal(NP) swabs in vial transport medium  Result Value Ref Range Status   SARS Coronavirus 2 by RT PCR NEGATIVE NEGATIVE Final    Comment: (NOTE) SARS-CoV-2 target nucleic acids are NOT DETECTED.  The SARS-CoV-2 RNA is generally detectable in upper respiratory specimens during the acute phase of infection. The lowest concentration of  SARS-CoV-2 viral copies this assay can detect is 138 copies/mL. A negative result does not preclude SARS-Cov-2 infection and should not be used as the sole basis for treatment or other patient management decisions. A negative result may occur with  improper specimen collection/handling, submission of specimen other than nasopharyngeal swab, presence of viral mutation(s) within the areas targeted by this assay, and inadequate number of viral copies(<138 copies/mL). A negative result must be combined with clinical observations, patient history, and epidemiological information. The expected result is Negative.  Fact Sheet for Patients:  EntrepreneurPulse.com.au  Fact Sheet for Healthcare Providers:  IncredibleEmployment.be  This test is no t  yet approved or cleared by the [place] and  has been authorized for detection and/or diagnosis of SARS-CoV-2 by FDA under an Emergency Use Authorization (EUA). This EUA will remain  in effect (meaning this test can be used) for the duration of the COVID-19 declaration under Section 564(b)(1) of the Act, 21 U.S.C.section 360bbb-3(b)(1), unless the authorization is terminated  or revoked sooner.       Influenza A by PCR NEGATIVE NEGATIVE Final   Influenza B by PCR NEGATIVE NEGATIVE Final    Comment: (NOTE) The Xpert Xpress SARS-CoV-2/FLU/RSV plus assay is intended as an aid in the diagnosis of influenza from Nasopharyngeal swab specimens and should not be used as a sole basis for treatment. Nasal washings and aspirates are unacceptable for Xpert Xpress SARS-CoV-2/FLU/RSV testing.  Fact Sheet for Patients: EntrepreneurPulse.com.au  Fact Sheet for Healthcare Providers: IncredibleEmployment.be  This test is not yet approved or cleared by the [place] FDA and has been authorized for detection and/or diagnosis of SARS-CoV-2 by FDA under an Emergency Use  Authorization (EUA). This EUA will remain in effect (meaning this test can be used) for the duration of the COVID-19 declaration under Section 564(b)(1) of the Act, 21 U.S.C. section 360bbb-3(b)(1), unless the authorization is terminated or revoked.  Performed at [facility], [address] [place], [place] [address]   MRSA Next Gen by PCR, Nasal     Status: None   Collection Time: 03/12/21  9:01 PM   Specimen: Nasal Mucosa; Nasal Swab  Result Value Ref Range Status   MRSA by PCR Next Gen NOT DETECTED NOT DETECTED Final    Comment: (NOTE) The GeneXpert MRSA Assay (FDA approved for NASAL specimens only), is one component of a comprehensive MRSA colonization surveillance program. It is not intended to diagnose MRSA infection nor to guide or monitor treatment for MRSA infections. Test performance is not FDA approved in patients less than 66 years old. Performed at [facility] [address], [place], [place] [address]     Procedures/Studies: DG Chest 2 View  Result Date: 03/09/2021 CLINICAL DATA:  Chest pain EXAM: CHEST - 2 VIEW COMPARISON:  01/14/2021 FINDINGS: Mild right hemidiaphragm elevation. Midline trachea.  Normal heart size and mediastinal contours. Sharp costophrenic angles.  No pneumothorax.  Clear lungs. IMPRESSION: No active cardiopulmonary disease. Electronically Signed   By: Abigail Miyamoto M.D.   On: 03/09/2021 13:51   CT Angio Chest PE W/Cm &/Or Wo Cm  Result Date: 03/12/2021 CLINICAL DATA:  Concern for pulmonary embolism.  Positive D-dimer. EXAM: CT ANGIOGRAPHY CHEST WITH CONTRAST TECHNIQUE: Multidetector CT imaging of the chest was performed using the standard protocol during bolus administration of intravenous contrast. Multiplanar CT image reconstructions and MIPs were obtained to evaluate the vascular anatomy. RADIATION DOSE REDUCTION: This exam was performed according to the departmental dose-optimization program which includes automated exposure  control, adjustment of the mA and/or kV according to patient size and/or use of iterative reconstruction technique. CONTRAST:  187mL OMNIPAQUE IOHEXOL 350 MG/ML SOLN COMPARISON:  None. FINDINGS: Cardiovascular: No filling defects within the pulmonary arteries to suggest acute pulmonary embolism. Coronary artery calcification and aortic atherosclerotic calcification. Mediastinum/Nodes: No enlarged mediastinal, hilar, or axillary lymph nodes. Thyroid gland, trachea, and esophagus demonstrate no significant findings. Lungs/Pleura: Lungs are clear. No pleural effusion or pneumothorax. Upper Abdomen: No acute abnormality. Musculoskeletal: No chest wall abnormality. No acute or significant osseous findings. Review of the MIP images confirms the above findings. IMPRESSION: 1. No acute  pulmonary embolism. 2. No acute pulmonary parenchymal findings. 3. Coronary artery calcification and Aortic Atherosclerosis (ICD10-I70.0). Electronically Signed   By: Suzy Bouchard M.D.   On: 03/12/2021 16:00   DG Chest Portable 1 View  Result Date: 03/12/2021 CLINICAL DATA:  Chest pain and shortness of breath for the past 3 days. EXAM: PORTABLE CHEST 1 VIEW COMPARISON:  03/09/2021 FINDINGS: Normal sized heart. Clear lungs with normal vascularity. Surgical absence of the distal right clavicle. IMPRESSION: No acute abnormality. Electronically Signed   By: Claudie Revering M.D.   On: 03/12/2021 14:37    Labs: BNP (last 3 results) No results for input(s): BNP in the last 8760 hours. Basic Metabolic Panel: Recent Labs  Lab 03/09/21 1333 03/12/21 1329  NA 135 132*  K 4.2 3.9  CL 102 98  CO2 21* 21*  GLUCOSE 281* 272*  BUN 13 17  CREATININE 1.01* 1.04*  CALCIUM 9.3 9.8   Liver Function Tests: Recent Labs  Lab 03/12/21 1329  AST 49*  ALT 38  ALKPHOS 69  BILITOT 0.4  PROT 8.3*  ALBUMIN 4.3   Recent Labs  Lab 03/12/21 2335  LIPASE 44   No results for input(s): AMMONIA in the last 168 hours. CBC: Recent Labs   Lab 03/09/21 1333 03/12/21 1329  WBC 6.9 8.0  NEUTROABS  --  4.8  HGB 14.5 15.4*  HCT 43.5 45.3  MCV 82.2 81.8  PLT 226 255   Cardiac Enzymes: No results for input(s): CKTOTAL, CKMB, CKMBINDEX, TROPONINI in the last 168 hours. BNP: Invalid input(s): POCBNP CBG: Recent Labs  Lab 03/13/21 0353 03/13/21 0748 03/13/21 1121 03/13/21 1321 03/13/21 1555  GLUCAP 210* 200* 199* 222* 327*   D-Dimer Recent Labs    03/12/21 1329  DDIMER 0.73*   Hgb A1c Recent Labs    03/13/21 0136  HGBA1C 11.0*   Lipid Profile Recent Labs    03/13/21 0136  CHOL 131  HDL 27*  LDLCALC UNABLE TO CALCULATE IF TRIGLYCERIDE OVER 400 mg/dL  TRIG 653*  CHOLHDL 4.9  LDLDIRECT 39.9   Thyroid function studies No results for input(s): TSH, T4TOTAL, T3FREE, THYROIDAB in the last 72 hours.  Invalid input(s): FREET3 Anemia work up No results for input(s): VITAMINB12, FOLATE, FERRITIN, TIBC, IRON, RETICCTPCT in the last 72 hours. Urinalysis    Component Value Date/Time   COLORURINE YELLOW 01/14/2021 [place] 01/14/2021 1158   LABSPEC 1.010 01/14/2021 1158   PHURINE 5.5 01/14/2021 1158   GLUCOSEU >=500 (A) 01/14/2021 1158   HGBUR NEGATIVE 01/14/2021 1158   BILIRUBINUR NEGATIVE 01/14/2021 1158   KETONESUR NEGATIVE 01/14/2021 1158   PROTEINUR NEGATIVE 01/14/2021 1158   NITRITE NEGATIVE 01/14/2021 1158   LEUKOCYTESUR NEGATIVE 01/14/2021 1158   Sepsis Labs Invalid input(s): PROCALCITONIN,  WBC,  LACTICIDVEN Microbiology Recent Results (from the past 240 hour(s))  Resp Panel by RT-PCR (Flu A&B, Covid) Nasopharyngeal Swab     Status: None   Collection Time: 03/12/21  5:27 PM   Specimen: Nasopharyngeal Swab; Nasopharyngeal(NP) swabs in vial transport medium  Result Value Ref Range Status   SARS Coronavirus 2 by RT PCR NEGATIVE NEGATIVE Final    Comment: (NOTE) SARS-CoV-2 target nucleic acids are NOT DETECTED.  The SARS-CoV-2 RNA is generally detectable in upper  respiratory specimens during the acute phase of infection. The lowest concentration of SARS-CoV-2 viral copies this assay can detect is 138 copies/mL. A negative result does not preclude SARS-Cov-2 infection and should not be used as the sole basis for treatment  or other patient management decisions. A negative result may occur with  improper specimen collection/handling, submission of specimen other than nasopharyngeal swab, presence of viral mutation(s) within the areas targeted by this assay, and inadequate number of viral copies(<138 copies/mL). A negative result must be combined with clinical observations, patient history, and epidemiological information. The expected result is Negative.  Fact Sheet for Patients:  EntrepreneurPulse.com.au  Fact Sheet for Healthcare Providers:  IncredibleEmployment.be  This test is no t yet approved or cleared by the [place] FDA and  has been authorized for detection and/or diagnosis of SARS-CoV-2 by FDA under an Emergency Use Authorization (EUA). This EUA will remain  in effect (meaning this test can be used) for the duration of the COVID-19 declaration under Section 564(b)(1) of the Act, 21 U.S.C.section 360bbb-3(b)(1), unless the authorization is terminated  or revoked sooner.       Influenza A by PCR NEGATIVE NEGATIVE Final   Influenza B by PCR NEGATIVE NEGATIVE Final    Comment: (NOTE) The Xpert Xpress SARS-CoV-2/FLU/RSV plus assay is intended as an aid in the diagnosis of influenza from Nasopharyngeal swab specimens and should not be used as a sole basis for treatment. Nasal washings and aspirates are unacceptable for Xpert Xpress SARS-CoV-2/FLU/RSV testing.  Fact Sheet for Patients: EntrepreneurPulse.com.au  Fact Sheet for Healthcare Providers: IncredibleEmployment.be  This test is not yet approved or cleared by the [place] FDA and has been  authorized for detection and/or diagnosis of SARS-CoV-2 by FDA under an Emergency Use Authorization (EUA). This EUA will remain in effect (meaning this test can be used) for the duration of the COVID-19 declaration under Section 564(b)(1) of the Act, 21 U.S.C. section 360bbb-3(b)(1), unless the authorization is terminated or revoked.  Performed at [facility], [address] [place], [place] [address]   MRSA Next Gen by PCR, Nasal     Status: None   Collection Time: 03/12/21  9:01 PM   Specimen: Nasal Mucosa; Nasal Swab  Result Value Ref Range Status   MRSA by PCR Next Gen NOT DETECTED NOT DETECTED Final    Comment: (NOTE) The GeneXpert MRSA Assay (FDA approved for NASAL specimens only), is one component of a comprehensive MRSA colonization surveillance program. It is not intended to diagnose MRSA infection nor to guide or monitor treatment for MRSA infections. Test performance is not FDA approved in patients less than 109 years old. Performed at [facility], [place] [address] [place], [place] [address]      Time coordinating discharge: 25 minutes  SIGNED: Antonieta Pert, MD  [facility] 03/13/2021, 4:16 PM  If 7PM-7AM, please contact night-coverage [email or web address]

## 2021-03-13 NOTE — TOC Progression Note (Signed)
Transition of Care [facility]) - Progression Note    Patient Details  Name: Jean Calderon MRN: [id number] Date of Birth: 12/25/1970  Transition of Care [facility]) CM/SW [place], RN Phone Number:[phone]  03/13/2021, 1:01 PM  Clinical Narrative:     Transition of Care (TOC) Screening Note   Patient Details  Name: Jean Calderon Date of Birth: [birth year]   Transition of Care [facility]) CM/SW Contact:    Angelita Ingles, RN Phone Number: 03/13/2021, 1:01 PM    Transition of Care Department [facility]) has reviewed patient and no TOC needs have been identified at this time. We will continue to monitor patient advancement through interdisciplinary progression rounds. If new patient transition needs arise, please place a TOC consult.          Expected Discharge Plan and Services                                                 Social Determinants of Health (SDOH) Interventions    Readmission Risk Interventions No flowsheet data found.

## 2021-03-25 DIAGNOSIS — F419 Anxiety disorder, unspecified: Secondary | ICD-10-CM | POA: Insufficient documentation

## 2021-04-12 ENCOUNTER — Encounter (HOSPITAL_BASED_OUTPATIENT_CLINIC_OR_DEPARTMENT_OTHER): Payer: Self-pay | Admitting: Emergency Medicine

## 2021-04-12 ENCOUNTER — Emergency Department (HOSPITAL_BASED_OUTPATIENT_CLINIC_OR_DEPARTMENT_OTHER): Payer: 59

## 2021-04-12 ENCOUNTER — Other Ambulatory Visit: Payer: Self-pay

## 2021-04-12 ENCOUNTER — Emergency Department (HOSPITAL_BASED_OUTPATIENT_CLINIC_OR_DEPARTMENT_OTHER)
Admission: EM | Admit: 2021-04-12 | Discharge: 2021-04-12 | Disposition: A | Discharge status: Home / Self Care | Payer: 59 | Attending: Emergency Medicine | Admitting: Emergency Medicine

## 2021-04-12 DIAGNOSIS — E119 Type 2 diabetes mellitus without complications: Secondary | ICD-10-CM | POA: Diagnosis not present

## 2021-04-12 DIAGNOSIS — F419 Anxiety disorder, unspecified: Secondary | ICD-10-CM | POA: Diagnosis not present

## 2021-04-12 DIAGNOSIS — R0602 Shortness of breath: Secondary | ICD-10-CM | POA: Insufficient documentation

## 2021-04-12 DIAGNOSIS — Z794 Long term (current) use of insulin: Secondary | ICD-10-CM | POA: Diagnosis not present

## 2021-04-12 DIAGNOSIS — Z7902 Long term (current) use of antithrombotics/antiplatelets: Secondary | ICD-10-CM | POA: Diagnosis not present

## 2021-04-12 DIAGNOSIS — I2510 Atherosclerotic heart disease of native coronary artery without angina pectoris: Secondary | ICD-10-CM | POA: Diagnosis not present

## 2021-04-12 DIAGNOSIS — R079 Chest pain, unspecified: Secondary | ICD-10-CM

## 2021-04-12 LAB — CBC WITH DIFFERENTIAL/PLATELET
Abs Immature Granulocytes: 0.04 10*3/uL (ref 0.00–0.07)
Basophils Absolute: 0 10*3/uL (ref 0.0–0.1)
Basophils Relative: 1 %
Eosinophils Absolute: 0.1 10*3/uL (ref 0.0–0.5)
Eosinophils Relative: 1 %
HCT: 43.5 % (ref 36.0–46.0)
Hemoglobin: 14.5 g/dL (ref 12.0–15.0)
Immature Granulocytes: 1 %
Lymphocytes Relative: 29 %
Lymphs Abs: 2.2 10*3/uL (ref 0.7–4.0)
MCH: 27.7 pg (ref 26.0–34.0)
MCHC: 33.3 g/dL (ref 30.0–36.0)
MCV: 83 fL (ref 80.0–100.0)
Monocytes Absolute: 0.4 10*3/uL (ref 0.1–1.0)
Monocytes Relative: 5 %
Neutro Abs: 4.9 10*3/uL (ref 1.7–7.7)
Neutrophils Relative %: 63 %
Platelets: 233 10*3/uL (ref 150–400)
RBC: 5.24 MIL/uL — ABNORMAL HIGH (ref 3.87–5.11)
RDW: 13.8 % (ref 11.5–15.5)
WBC: 7.7 10*3/uL (ref 4.0–10.5)
nRBC: 0 % (ref 0.0–0.2)

## 2021-04-12 LAB — COMPREHENSIVE METABOLIC PANEL
ALT: 35 U/L (ref 0–44)
AST: 43 U/L — ABNORMAL HIGH (ref 15–41)
Albumin: 4.2 g/dL (ref 3.5–5.0)
Alkaline Phosphatase: 66 U/L (ref 38–126)
Anion gap: 10 (ref 5–15)
BUN: 17 mg/dL (ref 6–20)
CO2: 22 mmol/L (ref 22–32)
Calcium: 9.3 mg/dL (ref 8.9–10.3)
Chloride: 105 mmol/L (ref 98–111)
Creatinine, Ser: 1.17 mg/dL — ABNORMAL HIGH (ref 0.44–1.00)
GFR, Estimated: 56 mL/min — ABNORMAL LOW (ref >60)
Glucose, Bld: 244 mg/dL — ABNORMAL HIGH (ref 70–99)
Potassium: 4.3 mmol/L (ref 3.5–5.1)
Sodium: 137 mmol/L (ref 135–145)
Total Bilirubin: 0.9 mg/dL (ref 0.3–1.2)
Total Protein: 7.8 g/dL (ref 6.5–8.1)

## 2021-04-12 LAB — TROPONIN I (HIGH SENSITIVITY): Troponin I (High Sensitivity): <2 ng/L (ref <18)

## 2021-04-12 MED ORDER — HYDROXYZINE HCL 25 MG PO TABS: 25.0000 mg | ORAL_TABLET | Freq: Four times a day (QID) | ORAL | 0 refills | Status: DC | PRN

## 2021-04-12 MED ORDER — SERTRALINE HCL 25 MG PO TABS: 25.0000 mg | ORAL_TABLET | Freq: Every day | ORAL | 0 refills | Status: DC

## 2021-04-12 MED ORDER — HYDROXYZINE HCL 10 MG PO TABS
10.0000 mg | ORAL_TABLET | Freq: Once | ORAL | Status: AC
Start: 2021-04-12 — End: 2021-04-12
  Administered 2021-04-12: 10 mg via ORAL
  Filled 2021-04-12: qty 1

## 2021-04-12 NOTE — ED Notes (Signed)
ED Provider at bedside. 

## 2021-04-12 NOTE — ED Triage Notes (Signed)
Pt reports she has been having shortness of breath, anxiety, crying, and L shoulder (known orthopedic injury). Pt reports she is on psychiatric medications, but they are not helping. No recent medication adjustments. Denies SI.

## 2021-04-12 NOTE — ED Provider Notes (Signed)
[facility] EMERGENCY DEPARTMENT Provider Note   CSN: [id number] Arrival date & time: 04/12/21  1045     History  Chief Complaint  Patient presents with   Anxiety   Shortness of Breath    Jean Calderon is a 51 y.o. female.  51 yo woman presents today with complaint of severe anxiety with mild SOB, some chest pain. Patient has significant PMH of CAD s/p stents, DM, currently perimenopausal started estradiol recently for hot flashes and mood swings. She states that her CP started yesterday, has been moderate, more of a substernal pressure, similar to what has happened previously, but much less painful than when she had MI. She reports a number of stressors and is ruminating on various concerns including upcoming shoulder surgery, her family, etc. She has a PCP who has referred her to psychiatry, but her appt is not for several months and she has tried various methods to address her anxiety including meditation, walks, calming music, games, but she feels overwhelmed and does not know what to do today. She has not tried any medication for anxiety, but is interested in SSRIs, not currently on medications listed in history (venlafaxine or mirtazapine). She reports no history of manic symptoms, denies SI/HI/VAH. No history of flights or long travel recently, no swelling or unilateral leg pain. Patient was admitted to hospital for ACS r/o in January for very similar picture, she had visit 2 weeks ago with cardiologist with normal echo, negative troponins.       Home Medications Prior to Admission medications   Medication Sig Start Date End Date Taking? Authorizing Provider  hydrOXYzine (ATARAX) 25 MG tablet Take 1-2 tablets (25-50 mg total) by mouth every 6 (six) hours as needed for anxiety. 04/12/21  Yes Gareth Morgan, MD  sertraline (ZOLOFT) 25 MG tablet Take 1 tablet (25 mg total) by mouth daily for 14 days. 04/12/21 04/26/21 Yes Gareth Morgan, MD  albuterol (VENTOLIN HFA) 108 (90  Base) MCG/ACT inhaler 2 puffs every 4 (four) hours as needed for shortness of breath or wheezing. 10/01/20   [provider]  atorvastatin (LIPITOR) 40 MG tablet Take 40 mg by mouth daily. 09/01/20   [provider]  cloNIDine (CATAPRES) 0.1 MG tablet Take 0.1 mg by mouth 2 (two) times daily. 01/08/21   [provider]  clopidogrel (PLAVIX) 75 MG tablet Take 75 mg by mouth daily. 11/25/20   [provider]  Continuous Blood Gluc Sensor (DEXCOM G6 SENSOR) MISC by Misc.(Non-Drug; Combo Route) route    [provider]  EQ SENNA-S 8.6-50 MG tablet Take 1 tablet by mouth 2 (two) times daily as needed for constipation. 02/07/21   [provider]  Estradiol-Norethindrone Acet 0.5-0.1 MG tablet Take 1 tablet by mouth daily. 12/18/20   [provider]  Insulin Disposable Pump (OMNIPOD 5 G6 POD, GEN 5,) MISC SMARTSIG:SUB-Q Every Other Day 12/02/20   [provider]  insulin lispro (HUMALOG) 100 UNIT/ML injection 200 Units See admin instructions. 200 units via insulin pump every 2- 3 days 10/11/19   [provider]  isosorbide mononitrate (IMDUR) 30 MG 24 hr tablet Take 30 mg by mouth daily. 11/25/20   [provider]  JARDIANCE 25 MG TABS tablet Take 25 mg by mouth daily. 12/14/20   [provider]  lisinopril-hydrochlorothiazide (ZESTORETIC) 20-12.5 MG tablet Take 1 tablet by mouth daily. 12/04/20   [provider]  LORazepam (ATIVAN) 0.5 MG tablet Take 0.5 mg by mouth every 8 (eight) hours as needed  for sleep. 06/18/20   [provider]  metoprolol succinate (TOPROL-XL) 100 MG 24 hr tablet Take 100 mg by mouth daily. 12/04/20   [provider]  nitroGLYCERIN (NITROSTAT) 0.4 MG SL tablet Place 0.4 mg under the tongue every 5 (five) minutes as needed for chest pain. 01/05/21   [provider]  oxyCODONE-acetaminophen (PERCOCET/ROXICET) 5-325 MG tablet Take 1 tablet by mouth every 6 (six)  hours as needed for severe pain. 10/16/20   [provider]  tizanidine (ZANAFLEX) 6 MG capsule Take 6 mg by mouth 3 (three) times daily as needed for muscle spasms. 12/03/20   [provider]  topiramate (TOPAMAX) 25 MG tablet Take 25 mg by mouth 2 (two) times daily as needed (headaches). 10/08/20   [provider]      Allergies    Tramadol, Nsaids, Sulfamethoxazole-trimethoprim, and Ibuprofen    Review of Systems   Review of Systems  Constitutional:  Negative for activity change, chills and fever.  HENT:  Negative for congestion, sinus pain and sore throat.   Respiratory:  Negative for cough.   Cardiovascular:  Positive for chest pain. Negative for palpitations and leg swelling.  Gastrointestinal:  Negative for abdominal pain, constipation, diarrhea and nausea.  Genitourinary:  Negative for dysuria, flank pain, frequency, pelvic pain, urgency and vaginal bleeding.  Neurological:  Negative for dizziness and headaches.  Psychiatric/Behavioral:  Positive for dysphoric mood. Negative for agitation and suicidal ideas. The patient is nervous/anxious.   All other systems reviewed and are negative.  Physical Exam Updated Vital Signs BP (!) 124/59    Pulse 81    Temp 98.1 F (36.7 C) (Oral)    Resp 20    SpO2 100%  Physical Exam Vitals and nursing note reviewed.  Constitutional:      General: She is not in acute distress.    Appearance: She is well-developed. She is obese. She is not ill-appearing, toxic-appearing or diaphoretic.  HENT:     Head: Normocephalic and atraumatic.  Cardiovascular:     Rate and Rhythm: Normal rate and regular rhythm.     Pulses: Normal pulses. No decreased pulses.     Heart sounds: Normal heart sounds. No murmur heard.   No friction rub. No gallop.  Pulmonary:     Effort: Pulmonary effort is normal. No tachypnea, accessory muscle usage or respiratory distress.     Breath sounds: Normal breath sounds. No decreased breath sounds,  wheezing, rhonchi or rales.  Chest:     Chest wall: No mass or tenderness.  Abdominal:     General: Bowel sounds are normal.     Palpations: Abdomen is soft.     Tenderness: There is no abdominal tenderness. There is no rebound.  Musculoskeletal:     Right lower leg: No edema.     Left lower leg: No edema.  Skin:    General: Skin is warm and dry.     Capillary Refill: Capillary refill takes less than 2 seconds.  Neurological:     Mental Status: She is alert.  Psychiatric:        Mood and Affect: Mood is anxious.        Behavior: Behavior normal. Behavior is not agitated.    ED Results / Procedures / Treatments   Labs (all labs ordered are listed, but only abnormal results are displayed) Labs Reviewed  CBC WITH DIFFERENTIAL/PLATELET - Abnormal; Notable for the following components:      Result Value   RBC 5.24 (*)  All other components within normal limits  COMPREHENSIVE METABOLIC PANEL - Abnormal; Notable for the following components:   Glucose, Bld 244 (*)    Creatinine, Ser 1.17 (*)    AST 43 (*)    GFR, Estimated 56 (*)    All other components within normal limits  TROPONIN I (HIGH SENSITIVITY)  TROPONIN I (HIGH SENSITIVITY)    EKG EKG Interpretation  Date/Time:  Sunday April 12 2021 11:04:51 EST Ventricular Rate:  83 PR Interval:  160 QRS Duration: 86 QT Interval:  364 QTC Calculation: 427 R Axis:   75 Text Interpretation: Normal sinus rhythm Cannot rule out Anterior infarct , age undetermined Abnormal ECG When compared with ECG of 12-Mar-2021 13:13, No significant change since last tracing Confirmed by Schlossman, Erin ([id number]) on 04/12/2021 12:46:32 PM  Radiology DG Chest 2 View  Result Date: 04/12/2021 CLINICAL DATA:  50 year old female with history of shortness of breath. EXAM: CHEST - 2 VIEW COMPARISON:  Chest x-ray 03/12/2021. FINDINGS: Lung volumes are normal. No consolidative airspace disease. No pleural effusions. No pneumothorax. No pulmonary  nodule or mass noted. Pulmonary vasculature and the cardiomediastinal silhouette are within normal limits. IMPRESSION: No radiographic evidence of acute cardiopulmonary disease. Electronically Signed   By: Daniel  Entrikin M.D.   On: 04/12/2021 11:33    Procedures Procedures    Medications Ordered in ED Medications  hydrOXYzine (ATARAX) tablet 10 mg (10 mg Oral Given 04/12/21 1316)    ED Course/ Medical Decision Making/ A&P Clinical Course as of 04/12/21 1429  Sun Apr 12, 2021  1229 DG Chest 2 View [CM]    Clinical Course User Index [CM] Loudon Krakow, MD                           Medical Decision Making 50 yo woman with PMH of CAD and risk factors (DM, estradiol) presents with 1 day of chest pain and anxiety. She reports her biggest concern is anxiety and does not think she is having MI. EKG sinus rhythm, troponin negative, cardiac etiology very unlikely. CMP unremarkable: scr is at baseline, mild AST elevation improved from last week. CBC WNL. Patient treated with hydroxyzine and reports feeling much better. She is interested in SSRI and CBT. Discussed with Dr. Schlossman, will give zoloft 25 mg x2 weeks and patient will follow up with PCP, who she has good access to for titration of meds. Discussed return precautions for chest pain, as well as expectations for SSRI trial. VSS and patient is stable for discharge at this time.  Amount and/or Complexity of Data Reviewed Labs: ordered. Decision-making details documented in ED Course. Radiology: ordered. Decision-making details documented in ED Course.  Risk Prescription drug management.           Final Clinical Impression(s) / ED Diagnoses Final diagnoses:  Anxiety  Chest pain, unspecified type    Rx / DC Orders ED Discharge Orders          Ordered    hydrOXYzine (ATARAX) 25 MG tablet  Every 6 hours PRN        04/12/21 1414    sertraline (ZOLOFT) 25 MG tablet  Daily        02 /12/23 1414               Gladys Damme, MD 04/12/21 1430    Gareth Morgan, MD 04/14/21 [phone]

## 2021-04-15 DIAGNOSIS — N951 Menopausal and female climacteric states: Secondary | ICD-10-CM | POA: Insufficient documentation

## 2021-05-11 ENCOUNTER — Other Ambulatory Visit: Payer: Self-pay

## 2021-05-11 ENCOUNTER — Emergency Department (HOSPITAL_BASED_OUTPATIENT_CLINIC_OR_DEPARTMENT_OTHER)
Admission: EM | Admit: 2021-05-11 | Discharge: 2021-05-11 | Discharge status: Against Medical Advice | Payer: 59 | Attending: Emergency Medicine | Admitting: Emergency Medicine

## 2021-05-11 ENCOUNTER — Encounter (HOSPITAL_BASED_OUTPATIENT_CLINIC_OR_DEPARTMENT_OTHER): Payer: Self-pay | Admitting: Emergency Medicine

## 2021-05-11 DIAGNOSIS — R451 Restlessness and agitation: Secondary | ICD-10-CM | POA: Diagnosis present

## 2021-05-11 DIAGNOSIS — Z5321 Procedure and treatment not carried out due to patient leaving prior to being seen by health care provider: Secondary | ICD-10-CM | POA: Diagnosis not present

## 2021-05-11 NOTE — ED Notes (Signed)
Called x2 to room pt , no answer, staff witnessed pt leaving the department  ?

## 2021-05-11 NOTE — ED Triage Notes (Addendum)
Reports anxiety feeling , reports lost her mom  2 days ago , unable to cope with her grieve .  ?Pt agitated and would not stop crying during triage  ?

## 2021-06-05 DIAGNOSIS — E1142 Type 2 diabetes mellitus with diabetic polyneuropathy: Secondary | ICD-10-CM | POA: Insufficient documentation

## 2021-07-08 ENCOUNTER — Emergency Department (HOSPITAL_BASED_OUTPATIENT_CLINIC_OR_DEPARTMENT_OTHER)
Admission: EM | Admit: 2021-07-08 | Discharge: 2021-07-08 | Disposition: A | Discharge status: Home / Self Care | Payer: BLUE CROSS/BLUE SHIELD | Attending: Emergency Medicine | Admitting: Emergency Medicine

## 2021-07-08 ENCOUNTER — Encounter (HOSPITAL_BASED_OUTPATIENT_CLINIC_OR_DEPARTMENT_OTHER): Payer: Self-pay

## 2021-07-08 ENCOUNTER — Other Ambulatory Visit: Payer: Self-pay

## 2021-07-08 DIAGNOSIS — Z794 Long term (current) use of insulin: Secondary | ICD-10-CM | POA: Insufficient documentation

## 2021-07-08 DIAGNOSIS — E1122 Type 2 diabetes mellitus with diabetic chronic kidney disease: Secondary | ICD-10-CM | POA: Diagnosis not present

## 2021-07-08 DIAGNOSIS — Z7902 Long term (current) use of antithrombotics/antiplatelets: Secondary | ICD-10-CM | POA: Diagnosis not present

## 2021-07-08 DIAGNOSIS — K0889 Other specified disorders of teeth and supporting structures: Secondary | ICD-10-CM | POA: Diagnosis present

## 2021-07-08 DIAGNOSIS — N182 Chronic kidney disease, stage 2 (mild): Secondary | ICD-10-CM | POA: Insufficient documentation

## 2021-07-08 DIAGNOSIS — I129 Hypertensive chronic kidney disease with stage 1 through stage 4 chronic kidney disease, or unspecified chronic kidney disease: Secondary | ICD-10-CM | POA: Diagnosis not present

## 2021-07-08 DIAGNOSIS — K029 Dental caries, unspecified: Secondary | ICD-10-CM | POA: Diagnosis not present

## 2021-07-08 DIAGNOSIS — I2510 Atherosclerotic heart disease of native coronary artery without angina pectoris: Secondary | ICD-10-CM | POA: Insufficient documentation

## 2021-07-08 DIAGNOSIS — Z7984 Long term (current) use of oral hypoglycemic drugs: Secondary | ICD-10-CM | POA: Diagnosis not present

## 2021-07-08 DIAGNOSIS — Z79899 Other long term (current) drug therapy: Secondary | ICD-10-CM | POA: Diagnosis not present

## 2021-07-08 DIAGNOSIS — Z87891 Personal history of nicotine dependence: Secondary | ICD-10-CM | POA: Diagnosis not present

## 2021-07-08 MED ORDER — OXYCODONE-ACETAMINOPHEN 10-325 MG PO TABS: 1.0000 | ORAL_TABLET | Freq: Four times a day (QID) | ORAL | 0 refills | Status: DC | PRN

## 2021-07-08 MED ORDER — METRONIDAZOLE 500 MG/100ML IV SOLN
500.0000 mg | Freq: Two times a day (BID) | INTRAVENOUS | Status: DC
  Administered 2021-07-08: 500 mg via INTRAVENOUS
  Filled 2021-07-08: qty 100

## 2021-07-08 MED ORDER — FENTANYL CITRATE PF 50 MCG/ML IJ SOSY
100.0000 ug | PREFILLED_SYRINGE | Freq: Once | INTRAMUSCULAR | Status: AC
  Administered 2021-07-08: 100 ug via INTRAVENOUS
  Filled 2021-07-08: qty 2

## 2021-07-08 MED ORDER — SODIUM CHLORIDE 0.9 % IV SOLN
2.0000 g | INTRAVENOUS | Status: DC
  Administered 2021-07-08: 2 g via INTRAVENOUS
  Filled 2021-07-08: qty 20

## 2021-07-08 NOTE — ED Triage Notes (Signed)
Pt reports bottom right dental pain x 2 weeks, currently taking Cleocin prescribed by her dentist on Friday. She reports it is not helping with the throbbing pain.   ?

## 2021-07-08 NOTE — ED Provider Notes (Signed)
? ?[facility] ?Provider Note: Georgena Spurling, MD, [place] ? ?CSN: [id number] ?MRN: [id number] ?ARRIVAL: 07/08/21 at [place] ?ROOM: [facility] ? ? ?CHIEF COMPLAINT  ?Dental Pain ? ? ?HISTORY OF PRESENT ILLNESS  ?07/08/21 12:35 AM ?Jean Calderon is a 51 y.o. female with multiple missing and carious teeth.  She has had right lower dental pain for about 2 weeks.  She has been placed on penicillin and then clindamycin but her pain continues to worsen.  She states her dentist will not pull the affected teeth until the infection is resolved.  She states her dentist told her to come to the ED for IV antibiotics if the oral clindamycin did not relieve her pain.  She rates her pain as a 7 out of 10, worse with eating or drinking. ? ? ?Past Medical History:  ?Diagnosis Date  ? CAD (coronary artery disease)   ? CKD (chronic kidney disease), stage II   ? DM (diabetes mellitus) ([place])   ? HTN (hypertension)   ? Malignant hyperthermia   ? Neuropathy   ? ? ?Past Surgical History:  ?Procedure Laterality Date  ? APPENDECTOMY    ? BACK SURGERY    ? CESAREAN SECTION    ? CORONARY ANGIOPLASTY WITH STENT PLACEMENT    ? ROTATOR CUFF REPAIR    ? ? ?Family History  ?Problem Relation Age of Onset  ? Heart disease Mother   ? Heart disease Sister   ? ? ?Social History  ? ?Tobacco Use  ? Smoking status: Former  ?  Types: Cigarettes  ? Smokeless tobacco: Never  ?Vaping Use  ? Vaping Use: Never used  ?Substance Use Topics  ? Alcohol use: Not Currently  ? Drug use: Never  ? ? ?Prior to Admission medications   ?Medication Sig Start Date End Date Taking? Authorizing Provider  ?oxyCODONE-acetaminophen (PERCOCET) 10-325 MG tablet Take 1 tablet by mouth every 6 (six) hours as needed for pain. 07/08/21  Yes Han Vejar, MD  ?albuterol (VENTOLIN HFA) 108 (90 Base) MCG/ACT inhaler 2 puffs every 4 (four) hours as needed for shortness of breath or wheezing. 10/01/20   [provider]  ?atorvastatin (LIPITOR) 40 MG tablet Take 40 mg by mouth daily.  09/01/20   [provider]  ?cloNIDine (CATAPRES) 0.1 MG tablet Take 0.1 mg by mouth 2 (two) times daily. 01/08/21   [provider]  ?clopidogrel (PLAVIX) 75 MG tablet Take 75 mg by mouth daily. 11/25/20   [provider]  ?Continuous Blood Gluc Sensor (DEXCOM G6 SENSOR) MISC by Misc.(Non-Drug; Combo Route) route    [provider]  ?EQ SENNA-S 8.6-50 MG tablet Take 1 tablet by mouth 2 (two) times daily as needed for constipation. 02/07/21   [provider]  ?Estradiol-Norethindrone Acet 0.5-0.1 MG tablet Take 1 tablet by mouth daily. 12/18/20   [provider]  ?hydrOXYzine (ATARAX) 25 MG tablet Take 1-2 tablets (25-50 mg total) by mouth every 6 (six) hours as needed for anxiety. 04/12/21   Gareth Morgan, MD  ?Insulin Disposable Pump (OMNIPOD 5 G6 POD, GEN 5,) MISC SMARTSIG:SUB-Q Every Other Day 12/02/20   [provider]  ?insulin lispro (HUMALOG) 100 UNIT/ML injection 200 Units See admin instructions. 200 units via insulin pump every 2- 3 days 10/11/19   [provider]  ?isosorbide mononitrate (IMDUR) 30 MG 24 hr tablet Take 30 mg by mouth daily. 11/25/20   [provider]  ?JARDIANCE 25 MG TABS tablet Take 25 mg by mouth daily. 12/14/20  [provider]  ?lisinopril-hydrochlorothiazide (ZESTORETIC) 20-12.5 MG tablet Take 1 tablet by mouth daily. 12/04/20   [provider]  ?LORazepam (ATIVAN) 0.5 MG tablet Take 0.5 mg by mouth every 8 (eight) hours as needed for sleep. 06/18/20   [provider]  ?metoprolol succinate (TOPROL-XL) 100 MG 24 hr tablet Take 100 mg by mouth daily. 12/04/20   [provider]  ?nitroGLYCERIN (NITROSTAT) 0.4 MG SL tablet Place 0.4 mg under the tongue every 5 (five) minutes as needed for chest pain. 01/05/21   [provider]  ?sertraline (ZOLOFT) 25 MG tablet Take 1 tablet (25 mg total) by mouth daily for 14 days. 04/12/21 04/26/21  Gareth Morgan, MD  ?tizanidine  (ZANAFLEX) 6 MG capsule Take 6 mg by mouth 3 (three) times daily as needed for muscle spasms. 12/03/20   [provider]  ?topiramate (TOPAMAX) 25 MG tablet Take 25 mg by mouth 2 (two) times daily as needed (headaches). 10/08/20   [provider]  ? ? ?Allergies ?Tramadol, Nsaids, Sulfamethoxazole-trimethoprim, and Ibuprofen ? ? ?REVIEW OF SYSTEMS  ?Negative except as noted here or in the History of Present Illness. ? ? ?PHYSICAL EXAMINATION  ?Initial Vital Signs ?Blood pressure 117/73, pulse 88, temperature 98 ?F (36.7 ?C), temperature source Oral, resp. rate 18, height '5\' 9"'$  (1.753 m), weight 122 kg, SpO2 99 %. ? ?Examination ?General: Well-developed, well-nourished female in no acute distress; appearance consistent with age of record ?HENT: normocephalic; atraumatic; multiple missing and carious teeth ?Eyes: Normal appearance ?Neck: supple ?Heart: regular rate and rhythm ?Lungs: clear to auscultation bilaterally ?Abdomen: soft; nondistended; nontender; bowel sounds present ?Extremities: No deformity; full range of motion ?Neurologic: Awake, alert and oriented; motor function intact in all extremities and symmetric; no facial droop ?Skin: Warm and dry ?Psychiatric: Normal mood and affect ? ? ?RESULTS  ?Summary of this visit's results, reviewed and interpreted by myself: ? ? EKG Interpretation ? ?Date/Time:    ?Ventricular Rate:    ?PR Interval:    ?QRS Duration:   ?QT Interval:    ?QTC Calculation:   ?R Axis:     ?Text Interpretation:   ?  ? ?  ? ?Laboratory Studies: ?No results found for this or any previous visit (from the past 24 hour(s)). ?Imaging Studies: ?No results found. ? ?ED COURSE and MDM  ?Nursing notes, initial and subsequent vitals signs, including pulse oximetry, reviewed and interpreted by myself. ? ?Vitals:  ? 07/08/21 0030 07/08/21 0031 07/08/21 0033  ?BP:   117/73  ?Pulse:  88   ?Resp:  18   ?Temp:  98 ?F (36.7 ?C)   ?TempSrc:  Oral   ?SpO2:  99%   ?Weight: 122 kg    ?Height: 5'  9" (1.753 m)    ? ?Medications  ?cefTRIAXone (ROCEPHIN) 2 g in sodium chloride 0.9 % 100 mL IVPB (has no administration in time range)  ?  And  ?metroNIDAZOLE (FLAGYL) IVPB 500 mg (has no administration in time range)  ?fentaNYL (SUBLIMAZE) injection 100 mcg (has no administration in time range)  ? ?IV clindamycin is on national shortage so we will give Rocephin and Flagyl IV and treat with a short course of narcotic analgesics.  She will need definitive treatment from her dentist. ? ? ?PROCEDURES  ?Procedures ? ? ?ED DIAGNOSES  ? ?  ICD-10-CM   ?1. Pain due to dental caries  K02.9   ?  ? ? ? ?  ?Shanon Rosser, MD ?07/08/21 0046 ? ?

## 2021-07-30 DIAGNOSIS — R2681 Unsteadiness on feet: Secondary | ICD-10-CM | POA: Insufficient documentation

## 2021-07-30 DIAGNOSIS — G43909 Migraine, unspecified, not intractable, without status migrainosus: Secondary | ICD-10-CM | POA: Insufficient documentation

## 2021-08-02 ENCOUNTER — Encounter (HOSPITAL_BASED_OUTPATIENT_CLINIC_OR_DEPARTMENT_OTHER): Payer: Self-pay | Admitting: Emergency Medicine

## 2021-08-02 ENCOUNTER — Emergency Department (HOSPITAL_BASED_OUTPATIENT_CLINIC_OR_DEPARTMENT_OTHER)
Admission: EM | Admit: 2021-08-02 | Discharge: 2021-08-02 | Disposition: A | Discharge status: Home / Self Care | Payer: Commercial Managed Care - HMO | Attending: Emergency Medicine | Admitting: Emergency Medicine

## 2021-08-02 ENCOUNTER — Other Ambulatory Visit: Payer: Self-pay

## 2021-08-02 DIAGNOSIS — Z7902 Long term (current) use of antithrombotics/antiplatelets: Secondary | ICD-10-CM | POA: Diagnosis not present

## 2021-08-02 DIAGNOSIS — Z794 Long term (current) use of insulin: Secondary | ICD-10-CM | POA: Diagnosis not present

## 2021-08-02 DIAGNOSIS — Z79899 Other long term (current) drug therapy: Secondary | ICD-10-CM | POA: Diagnosis not present

## 2021-08-02 DIAGNOSIS — K0889 Other specified disorders of teeth and supporting structures: Secondary | ICD-10-CM | POA: Diagnosis present

## 2021-08-02 MED ORDER — OXYCODONE-ACETAMINOPHEN 10-325 MG PO TABS: 1.0000 | ORAL_TABLET | Freq: Four times a day (QID) | ORAL | 0 refills | Status: DC | PRN

## 2021-08-02 MED ORDER — LOPERAMIDE HCL 2 MG PO CAPS: 2.0000 mg | ORAL_CAPSULE | Freq: Four times a day (QID) | ORAL | 0 refills | Status: DC | PRN

## 2021-08-02 MED ORDER — SODIUM CHLORIDE 0.9 % IV BOLUS
250.0000 mL | Freq: Once | INTRAVENOUS | Status: AC
  Administered 2021-08-02: 250 mL via INTRAVENOUS

## 2021-08-02 MED ORDER — OXYCODONE HCL 5 MG PO TABS
5.0000 mg | ORAL_TABLET | Freq: Once | ORAL | Status: AC
  Administered 2021-08-02: 5 mg via ORAL
  Filled 2021-08-02: qty 1

## 2021-08-02 NOTE — Discharge Instructions (Addendum)
Follow-up with your dentist or primary care provider for any further pain medication.  I am only sending you 12 for very severe pain.  Continue with your ice packs as well.

## 2021-08-02 NOTE — ED Provider Notes (Signed)
[facility] EMERGENCY DEPARTMENT Provider Note   CSN: [id number] Arrival date & time: 08/02/21  0907     History  Chief Complaint  Patient presents with   Dental Pain    Jean Calderon is a 51 y.o. female presenting with dental pain.  On Tuesday she had 7 of her lower teeth extracted due to cavities.  She was given acetaminophen-codeine which is not helping her pain.  She has also been using ice packs.  Unable to take NSAIDs because she is on Plavix.  Has a follow-up with her dentist late next week  She is also having frequent diarrhea from the clindamycin.  Today is day 5 of this antibiotic and she was put on it for 10 days.  Denies dizziness, syncope or blood in her diarrhea.  Says that her blood pressure always runs low.   Dental Pain     Home Medications Prior to Admission medications   Medication Sig Start Date End Date Taking? Authorizing Provider  albuterol (VENTOLIN HFA) 108 (90 Base) MCG/ACT inhaler 2 puffs every 4 (four) hours as needed for shortness of breath or wheezing. 10/01/20   [provider]  atorvastatin (LIPITOR) 40 MG tablet Take 40 mg by mouth daily. 09/01/20   [provider]  cloNIDine (CATAPRES) 0.1 MG tablet Take 0.1 mg by mouth 2 (two) times daily. 01/08/21   [provider]  clopidogrel (PLAVIX) 75 MG tablet Take 75 mg by mouth daily. 11/25/20   [provider]  Continuous Blood Gluc Sensor (DEXCOM G6 SENSOR) MISC by Misc.(Non-Drug; Combo Route) route    [provider]  EQ SENNA-S 8.6-50 MG tablet Take 1 tablet by mouth 2 (two) times daily as needed for constipation. 02/07/21   [provider]  Estradiol-Norethindrone Acet 0.5-0.1 MG tablet Take 1 tablet by mouth daily. 12/18/20   [provider]  hydrOXYzine (ATARAX) 25 MG tablet Take 1-2 tablets (25-50 mg total) by mouth every 6 (six) hours as needed for anxiety. 04/12/21   Gareth Morgan, MD  Insulin Disposable Pump (OMNIPOD 5 G6 POD,  GEN 5,) MISC SMARTSIG:SUB-Q Every Other Day 12/02/20   [provider]  insulin lispro (HUMALOG) 100 UNIT/ML injection 200 Units See admin instructions. 200 units via insulin pump every 2- 3 days 10/11/19   [provider]  isosorbide mononitrate (IMDUR) 30 MG 24 hr tablet Take 30 mg by mouth daily. 11/25/20   [provider]  JARDIANCE 25 MG TABS tablet Take 25 mg by mouth daily. 12/14/20   [provider]  lisinopril-hydrochlorothiazide (ZESTORETIC) 20-12.5 MG tablet Take 1 tablet by mouth daily. 12/04/20   [provider]  LORazepam (ATIVAN) 0.5 MG tablet Take 0.5 mg by mouth every 8 (eight) hours as needed for sleep. 06/18/20   [provider]  metoprolol succinate (TOPROL-XL) 100 MG 24 hr tablet Take 100 mg by mouth daily. 12/04/20   [provider]  nitroGLYCERIN (NITROSTAT) 0.4 MG SL tablet Place 0.4 mg under the tongue every 5 (five) minutes as needed for chest pain. 01/05/21   [provider]  oxyCODONE-acetaminophen (PERCOCET) 10-325 MG tablet Take 1 tablet by mouth every 6 (six) hours as needed for pain. 08/02/21   Wanda Rideout A, PA-C  sertraline (ZOLOFT) 25 MG tablet Take 1 tablet (25 mg total) by mouth daily for 14 days. 04/12/21 04/26/21  Gareth Morgan, MD  tizanidine (ZANAFLEX) 6 MG capsule Take 6 mg by mouth 3 (three) times daily as needed for muscle spasms. 12/03/20  [provider]  topiramate (TOPAMAX) 25 MG tablet Take 25 mg by mouth 2 (two) times daily as needed (headaches). 10/08/20   [provider]      Allergies    Tramadol, Nsaids, Sulfamethoxazole-trimethoprim, and Ibuprofen    Review of Systems   Review of Systems  Physical Exam Updated Vital Signs BP (!) 98/58 (BP Location: Right Arm)   Pulse 73   Resp 17   Ht '5\' 9"'$  (1.753 m)   Wt 122 kg   SpO2 98%   BMI 39.72 kg/m  Physical Exam Vitals and nursing note reviewed.  Constitutional:      Appearance: Normal appearance.   HENT:     Head: Normocephalic and atraumatic.     Mouth/Throat:     Mouth: Mucous membranes are moist.     Pharynx: Oropharynx is clear.     Comments: No bottom teeth in patient's mouth.  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  Overall, dental work well-healing without signs of abscess or infection.  Tolerating secretions and airway is clear Eyes:     General: No scleral icterus.    Conjunctiva/sclera: Conjunctivae normal.  Pulmonary:     Effort: Pulmonary effort is normal. No respiratory distress.  Skin:    Findings: No rash.  Neurological:     Mental Status: She is alert.  Psychiatric:        Mood and Affect: Mood normal.    ED Results / Procedures / Treatments   Labs (all labs ordered are listed, but only abnormal results are displayed) Labs Reviewed - No data to display  EKG None  Radiology No results found.  Procedures Procedures   Medications Ordered in ED Medications  sodium chloride 0.9 % bolus 250 mL (has no administration in time range)  oxyCODONE (Oxy IR/ROXICODONE) immediate release tablet 5 mg (5 mg Oral Given 08/02/21 8502)    ED Course/ Medical Decision Making/ A&P                           Medical Decision Making Risk Prescription drug management.   51 year old female status post 7 tooth dental extraction presenting today with dental pain.  Was given codeine acetaminophen which is not helping her pain.  Cannot have NSAIDs.  Treatment: Patient given oxycodone for pain.  Also given fluids for continued diarrhea per her request.  We discussed that she should contact her dentist about her diarrhea but she may likely be able to stop taking after 7 days as opposed to 10.  MDM/disposition: I will send patient home with 12 Percocet pills.  We discussed that this is an addictive substance.  She reports having it last month, which I also see on PDMP review.  She says that she did not take the majority of this and actually disposed of it at  the [facility] pharmacy.  She seems reasonable and she was told that for any further pain medication concerns she should follow-up with the dentist or her primary care provider.  She is agreeable and ambulated out of the department in stable condition   Final Clinical Impression(s) / ED Diagnoses Final diagnoses:  Pain, dental    Rx / DC Orders ED Discharge Orders          Ordered    oxyCODONE-acetaminophen (PERCOCET) 10-325 MG tablet  Every 6 hours PRN        08/02/21 7741  Results and diagnoses were explained to the patient. Return precautions discussed in full. Patient had no additional questions and expressed complete understanding.   This chart was dictated using voice recognition software.  Despite best efforts to proofread,  errors can occur which can change the documentation meaning.    Rhae Hammock, PA-C 08/02/21 [place], Ankit, MD 08/03/21 [phone]

## 2021-08-02 NOTE — ED Triage Notes (Signed)
Pt reports 7 dental extractions on Tuesday. Prescribed pain medications ineffective. Pt denies difficulty swallowing.

## 2021-09-21 ENCOUNTER — Other Ambulatory Visit: Payer: Self-pay

## 2021-09-21 ENCOUNTER — Emergency Department (HOSPITAL_BASED_OUTPATIENT_CLINIC_OR_DEPARTMENT_OTHER): Payer: 59

## 2021-09-21 ENCOUNTER — Encounter (HOSPITAL_BASED_OUTPATIENT_CLINIC_OR_DEPARTMENT_OTHER): Payer: Self-pay | Admitting: Emergency Medicine

## 2021-09-21 ENCOUNTER — Emergency Department (HOSPITAL_BASED_OUTPATIENT_CLINIC_OR_DEPARTMENT_OTHER)
Admission: EM | Admit: 2021-09-21 | Discharge: 2021-09-21 | Disposition: A | Discharge status: Home / Self Care | Payer: 59 | Attending: Emergency Medicine | Admitting: Emergency Medicine

## 2021-09-21 DIAGNOSIS — N189 Chronic kidney disease, unspecified: Secondary | ICD-10-CM | POA: Diagnosis not present

## 2021-09-21 DIAGNOSIS — Z7984 Long term (current) use of oral hypoglycemic drugs: Secondary | ICD-10-CM | POA: Diagnosis not present

## 2021-09-21 DIAGNOSIS — E1122 Type 2 diabetes mellitus with diabetic chronic kidney disease: Secondary | ICD-10-CM | POA: Insufficient documentation

## 2021-09-21 DIAGNOSIS — Z794 Long term (current) use of insulin: Secondary | ICD-10-CM | POA: Diagnosis not present

## 2021-09-21 DIAGNOSIS — M25561 Pain in right knee: Secondary | ICD-10-CM | POA: Insufficient documentation

## 2021-09-21 DIAGNOSIS — Z7902 Long term (current) use of antithrombotics/antiplatelets: Secondary | ICD-10-CM | POA: Diagnosis not present

## 2021-09-21 DIAGNOSIS — Z79899 Other long term (current) drug therapy: Secondary | ICD-10-CM | POA: Diagnosis not present

## 2021-09-21 DIAGNOSIS — I2510 Atherosclerotic heart disease of native coronary artery without angina pectoris: Secondary | ICD-10-CM | POA: Diagnosis not present

## 2021-09-21 DIAGNOSIS — E1140 Type 2 diabetes mellitus with diabetic neuropathy, unspecified: Secondary | ICD-10-CM | POA: Diagnosis not present

## 2021-09-21 DIAGNOSIS — I129 Hypertensive chronic kidney disease with stage 1 through stage 4 chronic kidney disease, or unspecified chronic kidney disease: Secondary | ICD-10-CM | POA: Diagnosis not present

## 2021-09-21 NOTE — ED Provider Notes (Addendum)
[facility] Provider Note   CSN: [id number] Arrival date & time: 09/21/21  1237     History  Chief Complaint  Patient presents with   Lytle Michaels    Jean Calderon is a 51 y.o. female with medical history of CAD, CKD, diabetes, hypertension, diabetic neuropathy.  The patient presents to ED for evaluation of right knee pain.  Patient states that prior to arrival she was at a fast food restaurant when she states her right leg "jumping" the patient states that what she means by this is that her leg began spasming.  The patient states that the spasms got so bad it caused her to fall down onto her right knee.  Patient denies striking her head.  Patient states that she also has entire leg numbness on the right side.  Patient states that she has a history of diabetic neuropathy and that her right leg numbness has been present for "over 7 months".  Patient states that she has been seen by her endocrinologist as well as a neurologist and had nerve conduction studies done in relation to her leg numbness.  Patient concerned because she states that her diabetic neuropathy is worsening and causing her to fall more frequently.  The patient states that she has "not been given any answers".   Fall       Home Medications Prior to Admission medications   Medication Sig Start Date End Date Taking? Authorizing Provider  albuterol (VENTOLIN HFA) 108 (90 Base) MCG/ACT inhaler 2 puffs every 4 (four) hours as needed for shortness of breath or wheezing. 10/01/20   [provider]  atorvastatin (LIPITOR) 40 MG tablet Take 40 mg by mouth daily. 09/01/20   [provider]  cloNIDine (CATAPRES) 0.1 MG tablet Take 0.1 mg by mouth 2 (two) times daily. 01/08/21   [provider]  clopidogrel (PLAVIX) 75 MG tablet Take 75 mg by mouth daily. 11/25/20   [provider]  Continuous Blood Gluc Sensor (DEXCOM G6 SENSOR) MISC by Misc.(Non-Drug; Combo Route) route     [provider]  EQ SENNA-S 8.6-50 MG tablet Take 1 tablet by mouth 2 (two) times daily as needed for constipation. 02/07/21   [provider]  Estradiol-Norethindrone Acet 0.5-0.1 MG tablet Take 1 tablet by mouth daily. 12/18/20   [provider]  hydrOXYzine (ATARAX) 25 MG tablet Take 1-2 tablets (25-50 mg total) by mouth every 6 (six) hours as needed for anxiety. 04/12/21   Gareth Morgan, MD  Insulin Disposable Pump (OMNIPOD 5 G6 POD, GEN 5,) MISC SMARTSIG:SUB-Q Every Other Day 12/02/20   [provider]  insulin lispro (HUMALOG) 100 UNIT/ML injection 200 Units See admin instructions. 200 units via insulin pump every 2- 3 days 10/11/19   [provider]  isosorbide mononitrate (IMDUR) 30 MG 24 hr tablet Take 30 mg by mouth daily. 11/25/20   [provider]  JARDIANCE 25 MG TABS tablet Take 25 mg by mouth daily. 12/14/20   [provider]  lisinopril-hydrochlorothiazide (ZESTORETIC) 20-12.5 MG tablet Take 1 tablet by mouth daily. 12/04/20   [provider]  loperamide (IMODIUM) 2 MG capsule Take 1 capsule (2 mg total) by mouth 4 (four) times daily as needed for diarrhea or loose stools. 08/02/21   Redwine, Madison A, PA-C  LORazepam (ATIVAN) 0.5 MG tablet Take 0.5 mg by mouth every 8 (eight) hours as needed for sleep. 06/18/20   [provider]  metoprolol succinate (TOPROL-XL) 100 MG 24 hr tablet Take  100 mg by mouth daily. 12/04/20   [provider]  nitroGLYCERIN (NITROSTAT) 0.4 MG SL tablet Place 0.4 mg under the tongue every 5 (five) minutes as needed for chest pain. 01/05/21   [provider]  oxyCODONE-acetaminophen (PERCOCET) 10-325 MG tablet Take 1 tablet by mouth every 6 (six) hours as needed for pain. 08/02/21   Redwine, Madison A, PA-C  sertraline (ZOLOFT) 25 MG tablet Take 1 tablet (25 mg total) by mouth daily for 14 days. 04/12/21 04/26/21  Gareth Morgan, MD  tizanidine (ZANAFLEX) 6 MG capsule  Take 6 mg by mouth 3 (three) times daily as needed for muscle spasms. 12/03/20   [provider]  topiramate (TOPAMAX) 25 MG tablet Take 25 mg by mouth 2 (two) times daily as needed (headaches). 10/08/20   [provider]      Allergies    Tramadol, Nsaids, Sulfamethoxazole-trimethoprim, and Ibuprofen    Review of Systems   Review of Systems  Musculoskeletal:  Positive for arthralgias.  All other systems reviewed and are negative.   Physical Exam Updated Vital Signs BP 108/74 (BP Location: Left Arm)   Pulse 90   Temp 98 F (36.7 C) (Oral)   Resp 18   Ht '5\' 9"'$  (9.373 m)   Wt 124.7 kg   SpO2 93%   BMI 40.61 kg/m  Physical Exam Vitals and nursing note reviewed.  Constitutional:      General: She is not in acute distress.    Appearance: Normal appearance. She is not ill-appearing, toxic-appearing or diaphoretic.  HENT:     Head: Normocephalic and atraumatic.     Nose: Nose normal. No congestion.     Mouth/Throat:     Mouth: Mucous membranes are moist.     Pharynx: Oropharynx is clear.  Eyes:     Extraocular Movements: Extraocular movements intact.     Conjunctiva/sclera: Conjunctivae normal.     Pupils: Pupils are equal, round, and reactive to light.  Cardiovascular:     Rate and Rhythm: Normal rate and regular rhythm.  Pulmonary:     Effort: Pulmonary effort is normal.     Breath sounds: Normal breath sounds. No wheezing.  Abdominal:     General: Abdomen is flat. Bowel sounds are normal.     Palpations: Abdomen is soft.     Tenderness: There is no abdominal tenderness.  Musculoskeletal:     Cervical back: Normal range of motion and neck supple. No tenderness.     Right knee: No swelling, deformity, effusion or erythema. Normal range of motion. Tenderness present.     Left knee: Normal.     Comments: Patient has full range of motion of her right knee.  Patient has nonfocal tenderness about the lateral side of her knee without overlying skin change.   There is no deformity present.  Skin:    General: Skin is warm and dry.     Capillary Refill: Capillary refill takes less than 2 seconds.  Neurological:     Mental Status: She is alert and oriented to person, place, and time.     ED Results / Procedures / Treatments   Labs (all labs ordered are listed, but only abnormal results are displayed) Labs Reviewed - No data to display  EKG None  Radiology DG Knee Complete 4 Views Right  Result Date: 09/21/2021 CLINICAL DATA:  Fall, right knee pain EXAM: RIGHT KNEE - COMPLETE 4+ VIEW COMPARISON:  None Available. FINDINGS: No evidence of fracture, dislocation, or joint effusion. No  evidence of arthropathy or other focal bone abnormality. Soft tissues are unremarkable. IMPRESSION: Negative. Electronically Signed   By: Davina Poke D.O.   On: 09/21/2021 15:53    Procedures Procedures   Medications Ordered in ED Medications - No data to display  ED Course/ Medical Decision Making/ A&P                           Medical Decision Making Amount and/or Complexity of Data Reviewed Radiology: ordered.   51 year old female presents to the ED for evaluation of right knee pain.  Please see HPI for further details.  On examination, the patient is afebrile and nontachycardic.  The patient lung sounds are clear bilaterally, she is not hypoxic on room air.  The patient's abdomen is soft and compressible all 4 quadrants.  Patient neurological examination shows no focal neurodeficits over the patient does have decree sensation to her right lower extremity.  The patient states that this is an ongoing problem for which is currently being worked up by neurology.  Patient plain from imaging of right knee shows no fracture, deformity, dislocation or effusion.    At this time, the patient will be discharged home and advised to follow back up with her neurologist for ongoing management of her diabetic neuropathy.  The patient was given return precautions  and she voiced understanding with these.  The patient had all of her questions answered to her satisfaction prior to discharge.  Patient stable for discharge at this time  Final Clinical Impression(s) / ED Diagnoses Final diagnoses:  Acute pain of right knee    Rx / DC Orders ED Discharge Orders     None            Azucena Cecil, PA-C 46/80/32 1224    Campbell Stall P, DO 82/50/03 2352

## 2021-09-21 NOTE — ED Triage Notes (Signed)
Pt has neuropathy in right leg and stated that today nerves started jumping and caused her to fall onto right knee. Complains of right knee pain and burning in right leg. Pt was able to walk to triage with steady gait.

## 2021-09-21 NOTE — ED Notes (Signed)
Dc instructions reviewed with pt no questions or concerns at this time. Will follow up with endocrinologist and neuro

## 2021-09-21 NOTE — Discharge Instructions (Addendum)
Please return to the ED with any new or worsening signs or symptoms such as new onset unilateral weakness or numbness which is changed from your baseline Please read attached guide concerning acute knee pain Please follow-up with your endocrinologist/neurologist for further management of your diabetic neuropathy

## 2021-09-30 DIAGNOSIS — E1140 Type 2 diabetes mellitus with diabetic neuropathy, unspecified: Secondary | ICD-10-CM | POA: Insufficient documentation

## 2021-09-30 DIAGNOSIS — G5711 Meralgia paresthetica, right lower limb: Secondary | ICD-10-CM | POA: Insufficient documentation

## 2021-10-01 DIAGNOSIS — E785 Hyperlipidemia, unspecified: Secondary | ICD-10-CM | POA: Insufficient documentation

## 2021-12-21 ENCOUNTER — Ambulatory Visit: Payer: Commercial Managed Care - HMO | Admitting: Family

## 2021-12-23 DIAGNOSIS — G5623 Lesion of ulnar nerve, bilateral upper limbs: Secondary | ICD-10-CM

## 2021-12-23 HISTORY — DX: Lesion of ulnar nerve, bilateral upper limbs: G56.23

## 2022-01-22 ENCOUNTER — Ambulatory Visit
Admission: EM | Admit: 2022-01-22 | Discharge: 2022-01-22 | Disposition: A | Discharge status: Home / Self Care | Payer: Commercial Managed Care - HMO | Attending: Family Medicine | Admitting: Family Medicine

## 2022-01-22 DIAGNOSIS — J0100 Acute maxillary sinusitis, unspecified: Secondary | ICD-10-CM

## 2022-01-22 DIAGNOSIS — J029 Acute pharyngitis, unspecified: Secondary | ICD-10-CM

## 2022-01-22 DIAGNOSIS — L03115 Cellulitis of right lower limb: Secondary | ICD-10-CM | POA: Diagnosis not present

## 2022-01-22 MED ORDER — AMOXICILLIN-POT CLAVULANATE 875-125 MG PO TABS: ORAL_TABLET | ORAL | 0 refills | Status: DC

## 2022-01-22 MED ORDER — MUPIROCIN 2 % EX OINT: TOPICAL_OINTMENT | CUTANEOUS | 10 refills | Status: DC

## 2022-01-22 MED ORDER — ALBUTEROL SULFATE HFA 108 (90 BASE) MCG/ACT IN AERS: 2.0000 | INHALATION_SPRAY | RESPIRATORY_TRACT | 1 refills | Status: DC | PRN

## 2022-01-22 NOTE — Discharge Instructions (Signed)
Continue nasal lavage daily.   Change bandage on right heel daily, and keep clean and dry. Try warm salt water gargles for sore throat.   May take Tylenol as needed for pain.

## 2022-01-22 NOTE — ED Triage Notes (Signed)
Pt states that she also has a wound on her right foot.

## 2022-01-22 NOTE — ED Triage Notes (Signed)
Pt states that she also has a headache and some ear pain.

## 2022-01-22 NOTE — ED Provider Notes (Signed)
[name]    CSN: [id number] Arrival date & time: 01/22/22  1514      History   Chief Complaint Chief Complaint  Patient presents with   Nasal Congestion    X1 week Nasal congestion, stuffy nose, facial pain, cough and chest congestion.    Wound Check    Wound on right foot    HPI Jean Calderon is a 51 y.o. female.   Patient presents with two complaints: 1)  About two weeks ago she developed mild URI symptoms.  She presented to an [facility] urgent care center 11 days ago where she was diagnosed with a viral URI and treated with guaifenesin and a cough suppressant.  COVID and flu tests were negative.  She now complains of a persistent worsening cough, nasal congestion and facial pain, and wheezing that improves somewhat with her albuterol inhaler.  She denies fever, pleuritic pain, and shortness of breath.  She has been using saline lavage for her sinus congestion. 2)  About a week ago she developed a blister on her right posterior heel that has not heeled.  The surrounding area remains painful and there is persistent mild drainage.  The history is provided by the patient.    Past Medical History:  Diagnosis Date   CAD (coronary artery disease)    CKD (chronic kidney disease), stage II    DM (diabetes mellitus) (HCC)    HTN (hypertension)    Malignant hyperthermia    Neuropathy     Patient Active Problem List   Diagnosis Date Noted   Chest pain 03/12/2021   CAD (coronary artery disease)    Uncontrolled diabetes mellitus with hyperglycemia (HCC)    HTN (hypertension)    CKD (chronic kidney disease), stage II     Past Surgical History:  Procedure Laterality Date   APPENDECTOMY     BACK SURGERY     CESAREAN SECTION     CORONARY ANGIOPLASTY WITH STENT PLACEMENT     MULTIPLE TOOTH EXTRACTIONS     ROTATOR CUFF REPAIR      OB History   No obstetric history on file.      Home Medications    Prior to Admission medications   Medication Sig  Start Date End Date Taking? Authorizing Provider  amoxicillin-clavulanate (AUGMENTIN) 875-125 MG tablet Take one tab PO BID with food 01/22/22  Yes Varshini Arrants, Ishmael Holter, MD  atorvastatin (LIPITOR) 40 MG tablet Take 40 mg by mouth daily. 09/01/20  Yes [provider]  cloNIDine (CATAPRES) 0.1 MG tablet Take 0.1 mg by mouth 2 (two) times daily. 01/08/21  Yes [provider]  clopidogrel (PLAVIX) 75 MG tablet Take 75 mg by mouth daily. 11/25/20  Yes [provider]  Continuous Blood Gluc Sensor (DEXCOM G6 SENSOR) MISC by Misc.(Non-Drug; Combo Route) route   Yes [provider]  EQ SENNA-S 8.6-50 MG tablet Take 1 tablet by mouth 2 (two) times daily as needed for constipation. 02/07/21  Yes [provider]  Estradiol-Norethindrone Acet 0.5-0.1 MG tablet Take 1 tablet by mouth daily. 12/18/20  Yes [provider]  hydrOXYzine (ATARAX) 25 MG tablet Take 1-2 tablets (25-50 mg total) by mouth every 6 (six) hours as needed for anxiety. 04/12/21  Yes Gareth Morgan, MD  Insulin Disposable Pump (OMNIPOD 5 G6 POD, GEN 5,) MISC SMARTSIG:SUB-Q Every Other Day 12/02/20  Yes [provider]  insulin lispro (HUMALOG) 100 UNIT/ML injection 200 Units See admin instructions. 200 units via insulin pump every 2- 3  days 10/11/19  Yes [provider]  isosorbide mononitrate (IMDUR) 30 MG 24 hr tablet Take 30 mg by mouth daily. 11/25/20  Yes [provider]  JARDIANCE 25 MG TABS tablet Take 25 mg by mouth daily. 12/14/20  Yes [provider]  lisinopril-hydrochlorothiazide (ZESTORETIC) 20-12.5 MG tablet Take 1 tablet by mouth daily. 12/04/20  Yes [provider]  loperamide (IMODIUM) 2 MG capsule Take 1 capsule (2 mg total) by mouth 4 (four) times daily as needed for diarrhea or loose stools. 08/02/21  Yes Redwine, Madison A, PA-C  LORazepam (ATIVAN) 0.5 MG tablet Take 0.5 mg by mouth every 8 (eight) hours as needed for sleep. 06/18/20  Yes  [provider]  metoprolol succinate (TOPROL-XL) 100 MG 24 hr tablet Take 100 mg by mouth daily. 12/04/20  Yes [provider]  mupirocin ointment (BACTROBAN) 2 % Apply to right heel TID 01/22/22  Yes Alyzah Pelly, Ishmael Holter, MD  nitroGLYCERIN (NITROSTAT) 0.4 MG SL tablet Place 0.4 mg under the tongue every 5 (five) minutes as needed for chest pain. 01/05/21  Yes [provider]  oxyCODONE-acetaminophen (PERCOCET) 10-325 MG tablet Take 1 tablet by mouth every 6 (six) hours as needed for pain. 08/02/21  Yes Redwine, Madison A, PA-C  tizanidine (ZANAFLEX) 6 MG capsule Take 6 mg by mouth 3 (three) times daily as needed for muscle spasms. 12/03/20  Yes [provider]  topiramate (TOPAMAX) 25 MG tablet Take 25 mg by mouth 2 (two) times daily as needed (headaches). 10/08/20  Yes [provider]  albuterol (VENTOLIN HFA) 108 (90 Base) MCG/ACT inhaler Inhale 2 puffs into the lungs every 4 (four) hours as needed for shortness of breath or wheezing. 01/22/22   Kandra Nicolas, MD  sertraline (ZOLOFT) 25 MG tablet Take 1 tablet (25 mg total) by mouth daily for 14 days. 04/12/21 04/26/21  Gareth Morgan, MD    Family History Family History  Problem Relation Age of Onset   Heart disease Mother    Heart disease Sister     Social History Social History   Tobacco Use   Smoking status: Former    Types: Cigarettes   Smokeless tobacco: Never  Vaping Use   Vaping Use: Never used  Substance Use Topics   Alcohol use: Not Currently   Drug use: Never     Allergies   Tramadol, Nsaids, Sulfamethoxazole-trimethoprim, and Ibuprofen   Review of Systems Review of Systems + sore throat + cough No pleuritic pain + wheezing + nasal congestion + post-nasal drainage + sinus pain/pressure No itchy/red eyes ? earache No hemoptysis No SOB No fever/chills No nausea No vomiting No abdominal pain No diarrhea No urinary symptoms No skin rash + fatigue No myalgias +  headache   Physical Exam Triage Vital Signs ED Triage Vitals  Enc Vitals Group     BP 01/22/22 1705 99/70     Pulse Rate 01/22/22 1705 89     Resp 01/22/22 1705 18     Temp 01/22/22 1705 99.1 F (37.3 C)     Temp Source 01/22/22 1705 Oral     SpO2 01/22/22 1705 97 %     Weight 01/22/22 1701 265 lb (120.2 kg)     Height 01/22/22 1701 '5\' 9"'$  (1.753 m)     Head Circumference --      Peak Flow --      Pain Score 01/22/22 1701 9     Pain Loc --      Pain Edu? --  Excl. in GC? --    No data found.  Updated Vital Signs BP 99/70 (BP Location: Left Arm)   Pulse 89   Temp 99.1 F (37.3 C) (Oral)   Resp 18   Ht '5\' 9"'$  (1.753 m)   Wt 120.2 kg   SpO2 97%   BMI 39.13 kg/m   Visual Acuity Right Eye Distance:   Left Eye Distance:   Bilateral Distance:    Right Eye Near:   Left Eye Near:    Bilateral Near:     Physical Exam Nursing notes and Vital Signs reviewed. Appearance:  Patient appears stated age, and in no acute distress.  She is alert and oriented.  Eyes:  Pupils are equal, round, and reactive to light and accomodation.  Extraocular movement is intact.  Conjunctivae are not inflamed  Ears:  Canals normal.  Tympanic membranes normal.  Nose:  Mildly congested turbinates.  Maxillary sinus tenderness is present.  Pharynx: Erythematous Neck:  Supple. Tonsillar nodes are mildly tender to palpation bilaterally.  Lungs:  Clear to auscultation.  Breath sounds are equal.  Moving air well. Heart:  Regular rate and rhythm without murmurs, rubs, or gallops.  Abdomen:  Nontender without masses or hepatosplenomegaly.  Bowel sounds are present.  No CVA or flank tenderness.  Extremities:  No edema.  Right posterior heel has a 2cm diameter excoriation without exudate.  The surrounding area is mildly tender to palpation but not erythematous or swollen Skin:  No rash present.   UC Treatments / Results  Labs (all labs ordered are listed, but only abnormal results are  displayed) Labs Reviewed - No data to display  EKG   Radiology No results found.  Procedures Procedures (including critical care time)  Medications Ordered in UC Medications - No data to display  Initial Impression / Assessment and Plan / UC Course  I have reviewed the triage vital signs and the nursing notes.  Pertinent labs & imaging results that were available during my care of the patient were reviewed by me and considered in my medical decision making (see chart for details).    Begin Augmentin.  Refill albuterol inhaler. Applied bacitracin ointment and non-stick bandage to excoriated area right heel.  Begin Bactroban ointment to right heel daily. Followup with Family Doctor if not improved in one week.   Final Clinical Impressions(s) / UC Diagnoses   Final diagnoses:  Acute maxillary sinusitis, recurrence not specified  Acute pharyngitis, unspecified etiology  Cellulitis of right heel     Discharge Instructions      Continue nasal lavage daily.   Change bandage on right heel daily, and keep clean and dry. Try warm salt water gargles for sore throat.   May take Tylenol as needed for pain.    ED Prescriptions     Medication Sig Dispense Auth. Provider   amoxicillin-clavulanate (AUGMENTIN) 875-125 MG tablet Take one tab PO BID with food 20 tablet Kandra Nicolas, MD   mupirocin ointment (BACTROBAN) 2 % Apply to right heel TID 22 g Kandra Nicolas, MD   albuterol (VENTOLIN HFA) 108 (90 Base) MCG/ACT inhaler Inhale 2 puffs into the lungs every 4 (four) hours as needed for shortness of breath or wheezing. 1 each Kandra Nicolas, MD         Kandra Nicolas, MD 01/24/22 1539

## 2022-01-22 NOTE — ED Triage Notes (Signed)
Pt states that she has some nasal congestion, stuffy nose, facial pain and chest congestion. X1 week

## 2022-01-29 DIAGNOSIS — U071 COVID-19: Secondary | ICD-10-CM

## 2022-01-29 HISTORY — DX: COVID-19: U07.1

## 2022-02-02 ENCOUNTER — Ambulatory Visit: Payer: Commercial Managed Care - HMO | Admitting: Family Medicine

## 2022-02-04 ENCOUNTER — Ambulatory Visit (INDEPENDENT_AMBULATORY_CARE_PROVIDER_SITE_OTHER): Payer: Commercial Managed Care - HMO | Admitting: Family Medicine

## 2022-02-04 ENCOUNTER — Telehealth: Payer: Self-pay

## 2022-02-04 ENCOUNTER — Other Ambulatory Visit: Payer: Self-pay | Admitting: Family Medicine

## 2022-02-04 ENCOUNTER — Encounter: Payer: Self-pay | Admitting: Family Medicine

## 2022-02-04 VITALS — BP 97/58 | HR 89 | Temp 97.4°F | Ht 69.0 in | Wt 271.2 lb

## 2022-02-04 DIAGNOSIS — I509 Heart failure, unspecified: Secondary | ICD-10-CM | POA: Diagnosis not present

## 2022-02-04 DIAGNOSIS — J301 Allergic rhinitis due to pollen: Secondary | ICD-10-CM

## 2022-02-04 DIAGNOSIS — G43909 Migraine, unspecified, not intractable, without status migrainosus: Secondary | ICD-10-CM

## 2022-02-04 DIAGNOSIS — Z124 Encounter for screening for malignant neoplasm of cervix: Secondary | ICD-10-CM

## 2022-02-04 DIAGNOSIS — I2510 Atherosclerotic heart disease of native coronary artery without angina pectoris: Secondary | ICD-10-CM | POA: Diagnosis not present

## 2022-02-04 DIAGNOSIS — G5603 Carpal tunnel syndrome, bilateral upper limbs: Secondary | ICD-10-CM

## 2022-02-04 DIAGNOSIS — E1142 Type 2 diabetes mellitus with diabetic polyneuropathy: Secondary | ICD-10-CM

## 2022-02-04 DIAGNOSIS — F418 Other specified anxiety disorders: Secondary | ICD-10-CM

## 2022-02-04 DIAGNOSIS — L409 Psoriasis, unspecified: Secondary | ICD-10-CM

## 2022-02-04 DIAGNOSIS — E1165 Type 2 diabetes mellitus with hyperglycemia: Secondary | ICD-10-CM

## 2022-02-04 DIAGNOSIS — Z7689 Persons encountering health services in other specified circumstances: Secondary | ICD-10-CM

## 2022-02-04 DIAGNOSIS — Z9641 Presence of insulin pump (external) (internal): Secondary | ICD-10-CM

## 2022-02-04 LAB — POCT GLYCOSYLATED HEMOGLOBIN (HGB A1C)

## 2022-02-04 MED ORDER — VENLAFAXINE HCL ER 37.5 MG PO CP24: 37.5000 mg | ORAL_CAPSULE | Freq: Every day | ORAL | 1 refills | Status: DC

## 2022-02-04 MED ORDER — MONTELUKAST SODIUM 10 MG PO TABS: 10.0000 mg | ORAL_TABLET | Freq: Every day | ORAL | 3 refills | Status: DC

## 2022-02-04 MED ORDER — ISOSORBIDE MONONITRATE ER 30 MG PO TB24: 30.0000 mg | ORAL_TABLET | Freq: Every day | ORAL | 1 refills | Status: DC

## 2022-02-04 MED ORDER — TRAZODONE HCL 50 MG PO TABS: 25.0000 mg | ORAL_TABLET | Freq: Every evening | ORAL | 3 refills | Status: DC | PRN

## 2022-02-04 MED ORDER — FLUTICASONE PROPIONATE 50 MCG/ACT NA SUSP: 2.0000 | Freq: Every day | NASAL | 0 refills | Status: DC

## 2022-02-04 NOTE — Addendum Note (Signed)
Addended by: Leeanne Rio on: 02/04/2022 01:27 PM   Modules accepted: Orders

## 2022-02-04 NOTE — Progress Notes (Addendum)
New Patient Office Visit  Subjective    Patient ID: Jean Calderon, female    DOB: 06-Feb-1971  Age: 51 y.o. MRN: [id number]  CC:  Chief Complaint  Patient presents with   New Patient (Initial Visit)    Patient in office  to est PCP   Diabetes   Sinus Problem    Patient c/o sinus issus- nasal congestion, drainage, tickle in throat and continuing snorting  x around 2 weeks.  Negative for covid and flu x 2 mths ago at urgent care.     HPI Jean Calderon presents to establish care. Originally from [place]. She use to see Jean Calderon with [facility]. She says she moved from [place] to [place]. She says her mother recently passed and she felt like this caused her mother to pass because she couldn't take care of her mother like she use to.  Pt has hx of uncontrolled diabetes. She use to see Endocrinologist with [facility]. She reports she can't take Jardiance due to insurance. She has insulin pump that she uses Humalog with. She says she hasn't been seen by the Endocrinologist so she hasn't had this adjusted lately. She has been using 200 units every 2-3 days per her pump and previous instructions.  She has hx of MI and CAD with 4 stents. She is taking Plavix, Atorvastatin, Imdur, Metoprolol daily. She needs Imdur refilled.  Pt has hx of depression and was taking Effexor. She has been out of this for the last few months. She is also seeing a counselor with [facility]. She says she was on Trazodone but it made her loopy.  Pt is taking Lyrica for neuropathy.  She is using Topiramate for migraines.  She has hx of psoriasis. Seeing specialist for [place].     02/04/2022   11:38 AM  Depression screen PHQ 2/9  Decreased Interest 3  Down, Depressed, Hopeless 3  PHQ - 2 Score 6  Altered sleeping 3  Tired, decreased energy 3  Change in appetite 3  Feeling bad or failure about yourself  3  Trouble concentrating 3  Moving slowly or fidgety/restless 3  Suicidal thoughts 0  PHQ-9 Score 24       02/04/2022    11:38 AM  GAD 7 : Generalized Anxiety Score  Nervous, Anxious, on Edge 3  Control/stop worrying 3  Worry too much - different things 3  Trouble relaxing 3  Restless 3  Easily annoyed or irritable 3  Afraid - awful might happen 3  Total GAD 7 Score 21  Anxiety Difficulty Very difficult    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    Outpatient Encounter Medications as of 02/04/2022  Medication Sig   albuterol (VENTOLIN HFA) 108 (90 Base) MCG/ACT inhaler Inhale 2 puffs into the lungs every 4 (four) hours as needed for shortness of breath or wheezing.   aspirin EC 81 MG tablet Take 81 mg by mouth daily.   atorvastatin (LIPITOR) 40 MG tablet Take 40 mg by mouth daily.   cloNIDine (CATAPRES) 0.1 MG tablet Take 0.1 mg by mouth 2 (two) times daily.   clopidogrel (PLAVIX) 75 MG tablet Take 75 mg by mouth daily.   Continuous Blood Gluc Sensor (DEXCOM G6 SENSOR) MISC by Misc.(Non-Drug; Combo Route) route   Continuous Blood Gluc Transmit (DEXCOM G6 TRANSMITTER) MISC Use as directed for continuous glucose monitoring. Reuse transmitter for 90 days then discard and replace.   Crisaborole (EUCRISA) 2 % OINT Apply 1 Application topically  2 (two) times daily.   Estradiol-Norethindrone Acet 0.5-0.1 MG tablet Take 1 tablet by mouth daily.   fluticasone (FLONASE) 50 MCG/ACT nasal spray Place 2 sprays into both nostrils daily.   insulin aspart (NOVOLOG) 100 UNIT/ML injection Use up to 80 units daily [facility] in insulin pump   Insulin Disposable Pump (OMNIPOD 5 G6 POD, GEN 5,) MISC SMARTSIG:SUB-Q Every Other Day   insulin lispro (HUMALOG) 100 UNIT/ML injection 200 Units See admin instructions. 200 units via insulin pump every 2- 3 days   lisinopril-hydrochlorothiazide (ZESTORETIC) 20-12.5 MG tablet Take 1 tablet by mouth daily.   metoprolol succinate (TOPROL-XL) 100 MG 24 hr tablet Take 100 mg by mouth daily.   metoprolol succinate (TOPROL-XL) 100 MG 24 hr tablet Take 1 tablet by mouth daily.    montelukast (SINGULAIR) 10 MG tablet Take 1 tablet (10 mg total) by mouth at bedtime.   nitroGLYCERIN (NITROSTAT) 0.4 MG SL tablet Place 0.4 mg under the tongue every 5 (five) minutes as needed for chest pain.   pregabalin (LYRICA) 200 MG capsule Take 200 mg by mouth 3 (three) times daily.   SKYRIZI PEN 150 MG/ML SOAJ Inject 1 mL into the skin every 3 (three) months.   topiramate (TOPAMAX) 100 MG tablet Take by mouth 2 (two) times daily.   traZODone (DESYREL) 50 MG tablet Take 0.5-1 tablets (25-50 mg total) by mouth at bedtime as needed for sleep.   venlafaxine XR (EFFEXOR XR) 37.5 MG 24 hr capsule Take 1 capsule (37.5 mg total) by mouth daily with breakfast.   [DISCONTINUED] cetirizine (ZYRTEC) 10 MG tablet Take 10 mg by mouth daily.   [DISCONTINUED] cyclobenzaprine (FLEXERIL) 10 MG tablet Take 10 mg by mouth 3 (three) times daily as needed.   [DISCONTINUED] hydrOXYzine (ATARAX) 25 MG tablet Take 1-2 tablets (25-50 mg total) by mouth every 6 (six) hours as needed for anxiety.   [DISCONTINUED] isosorbide mononitrate (IMDUR) 30 MG 24 hr tablet Take 30 mg by mouth daily.   [DISCONTINUED] tizanidine (ZANAFLEX) 6 MG capsule Take 6 mg by mouth 3 (three) times daily as needed for muscle spasms.   isosorbide mononitrate (IMDUR) 30 MG 24 hr tablet Take 1 tablet (30 mg total) by mouth daily.   [DISCONTINUED] empagliflozin (JARDIANCE) 25 MG TABS tablet Take 1 tablet by mouth daily. (Patient not taking: Reported on 02/04/2022)   [DISCONTINUED] EQ SENNA-S 8.6-50 MG tablet Take 1 tablet by mouth 2 (two) times daily as needed for constipation. (Patient not taking: Reported on 02/04/2022)   [DISCONTINUED] lidocaine (LIDODERM) 5 % Place 1 patch onto the skin daily. (Patient not taking: Reported on 02/04/2022)   [DISCONTINUED] loperamide (IMODIUM) 2 MG capsule Take 1 capsule (2 mg total) by mouth 4 (four) times daily as needed for diarrhea or loose stools. (Patient not taking: Reported on 02/04/2022)   [DISCONTINUED]  LORazepam (ATIVAN) 0.5 MG tablet Take 0.5 mg by mouth daily as needed. (Patient not taking: Reported on 02/04/2022)   [DISCONTINUED] mupirocin ointment (BACTROBAN) 2 % Apply to right heel TID (Patient not taking: Reported on 02/04/2022)   [DISCONTINUED] oxyCODONE-acetaminophen (PERCOCET) 10-325 MG tablet Take 1 tablet by mouth every 6 (six) hours as needed for pain. (Patient not taking: Reported on 02/04/2022)   [DISCONTINUED] sertraline (ZOLOFT) 100 MG tablet Take 1 tablet by mouth daily. (Patient not taking: Reported on 02/04/2022)   [DISCONTINUED] traZODone (DESYREL) 100 MG tablet Take 100-200 mg by mouth at bedtime as needed. (Patient not taking: Reported on 02/04/2022)   [DISCONTINUED] Venlafaxine HCl 225 MG TB24  Take 1 tablet by mouth daily. (Patient not taking: Reported on 02/04/2022)   No facility-administered encounter medications on file as of 02/04/2022.    Past Medical History:  Diagnosis Date   CAD (coronary artery disease)    CKD (chronic kidney disease), stage II    DM (diabetes mellitus) (HCC)    HTN (hypertension)    Malignant hyperthermia    Neuropathy     Past Surgical History:  Procedure Laterality Date   APPENDECTOMY     BACK SURGERY     CESAREAN SECTION     CORONARY ANGIOPLASTY WITH STENT PLACEMENT     MULTIPLE TOOTH EXTRACTIONS     ROTATOR CUFF REPAIR      Family History  Problem Relation Age of Onset   Heart disease Mother    Heart disease Sister     Social History   Socioeconomic History   Marital status: Divorced    Spouse name: Not on file   Number of children: Not on file   Years of education: Not on file   Highest education level: Not on file  Occupational History   Not on file  Tobacco Use   Smoking status: Former    Types: Cigarettes   Smokeless tobacco: Never  Vaping Use   Vaping Use: Never used  Substance and Sexual Activity   Alcohol use: Not Currently   Drug use: Never   Sexual activity: Not on file  Other Topics Concern   Not on  file  Social History Narrative   Not on file   Social Determinants of Health   Financial Resource Strain: Not on file  Food Insecurity: Not on file  Transportation Needs: Not on file  Physical Activity: Not on file  Stress: Not on file  Social Connections: Not on file  Intimate Partner Violence: Not on file    Review of Systems  Constitutional:  Negative for malaise/fatigue.  HENT:  Positive for congestion and sinus pain. Negative for ear pain.   Eyes:  Negative for blurred vision and double vision.  Respiratory:  Negative for cough, shortness of breath and wheezing.   Cardiovascular:  Negative for chest pain and claudication.  Gastrointestinal:  Negative for abdominal pain, heartburn, nausea and vomiting.  Genitourinary:  Negative for dysuria and urgency.  Musculoskeletal:  Negative for joint pain and neck pain.  Skin:  Negative for itching and rash.  Neurological:  Negative for dizziness and headaches.  Psychiatric/Behavioral:  Negative for depression, hallucinations and memory loss. The patient does not have insomnia.   All other systems reviewed and are negative.       Objective    BP (!) 97/58   Pulse 89   Temp (!) 97.4 F (36.3 C)   Ht '5\' 9"'$  (1.753 m)   Wt 271 lb 3 oz (123 kg)   SpO2 98%   BMI 40.05 kg/m   Physical Exam Vitals and nursing note reviewed.  Constitutional:      Appearance: Normal appearance. She is obese.  HENT:     Head: Normocephalic and atraumatic.     Right Ear: Tympanic membrane, ear canal and external ear normal.     Left Ear: Tympanic membrane, ear canal and external ear normal.     Nose: Nose normal.     Mouth/Throat:     Mouth: Mucous membranes are moist.     Pharynx: Oropharynx is clear.  Eyes:     Conjunctiva/sclera: Conjunctivae normal.     Pupils: Pupils are equal, round, and  reactive to light.  Cardiovascular:     Rate and Rhythm: Normal rate and regular rhythm.     Pulses: Normal pulses.     Heart sounds: Normal heart  sounds.  Pulmonary:     Effort: Pulmonary effort is normal.     Breath sounds: Normal breath sounds.  Abdominal:     General: Abdomen is flat. Bowel sounds are normal.  Skin:    General: Skin is warm.     Capillary Refill: Capillary refill takes less than 2 seconds.  Neurological:     General: No focal deficit present.     Mental Status: She is alert and oriented to person, place, and time. Mental status is at baseline.  Psychiatric:        Mood and Affect: Mood normal.        Behavior: Behavior normal.        Thought Content: Thought content normal.        Judgment: Judgment normal.        Assessment & Plan:   Problem List Items Addressed This Visit       Cardiovascular and Mediastinum   CAD (coronary artery disease)   Relevant Medications   isosorbide mononitrate (IMDUR) 30 MG 24 hr tablet   Other Relevant Orders   Ambulatory referral to Cardiology   Congestive heart failure (HCC)   Relevant Medications   isosorbide mononitrate (IMDUR) 30 MG 24 hr tablet   Other Relevant Orders   Ambulatory referral to Cardiology   Migraine without status migrainosus, not intractable   Relevant Medications   isosorbide mononitrate (IMDUR) 30 MG 24 hr tablet   venlafaxine XR (EFFEXOR XR) 37.5 MG 24 hr capsule   traZODone (DESYREL) 50 MG tablet     Endocrine   Poorly controlled type 2 diabetes mellitus with peripheral neuropathy (HCC)   Relevant Medications   venlafaxine XR (EFFEXOR XR) 37.5 MG 24 hr capsule   traZODone (DESYREL) 50 MG tablet   Other Relevant Orders   POCT glycosylated hemoglobin (Hb A1C) (Completed)   Microalbumin / creatinine urine ratio   Comprehensive metabolic panel   Ambulatory referral to Endocrinology     Nervous and Auditory   Bilateral carpal tunnel syndrome   Relevant Medications   venlafaxine XR (EFFEXOR XR) 37.5 MG 24 hr capsule   traZODone (DESYREL) 50 MG tablet     Musculoskeletal and Integument   Psoriasis     Other   Depression with  anxiety   Relevant Medications   venlafaxine XR (EFFEXOR XR) 37.5 MG 24 hr capsule   traZODone (DESYREL) 50 MG tablet   Other Relevant Orders   Ambulatory referral to Behavioral Health   Insulin pump in place   Relevant Orders   Ambulatory referral to Endocrinology   Other Visit Diagnoses     Encounter to establish care with new doctor    -  Primary   Screening for cervical cancer       Relevant Orders   Ambulatory referral to Obstetrics / Gynecology   Seasonal allergic rhinitis due to pollen       Relevant Medications   montelukast (SINGULAIR) 10 MG tablet   fluticasone (FLONASE) 50 MCG/ACT nasal spray      Lab Results  Component Value Date   HGBA1C 14.0+ 02/04/2022   Uncontrolled. Refer to Endo urgently for evaluation and treatment options of insulin pump. For CAD refer to cardiology. Refer to [facility] for depression/anxiety. Refilled and lowered dose of Effexor and Trazodone since she's been  off of this for a while.  Refer to Gyn for pap smear Nasal congestion likely from allergic rhinitis. Send in Singulair and Flonase for now. See back in 3 months for chronic condition follow up.  Added podiatry referral. Pt called in after visit and asked for referral for nail trimming and diabetic foot care.  Return in about 3 months (around 05/06/2022) for Chronic condition follow up.   Leeanne Rio, MD

## 2022-02-04 NOTE — Telephone Encounter (Signed)
Patient informed. 

## 2022-02-04 NOTE — Telephone Encounter (Signed)
Noted and aware. Will send referral.

## 2022-02-04 NOTE — Telephone Encounter (Signed)
Initiated Prior authorization [id number] 25 MG TABS tablet Via: Covermymeds Case/Key:N/A Status: cancelled  as of /02/04/22 Reason: Medicine was prescribed by historical provider no active script on file to continue  Notified Pt via: [place]

## 2022-02-04 NOTE — Telephone Encounter (Signed)
Patient in office- states even with PA her Jardiance costs $700.00= could you check into the patient assistance program for her to see if she would qualify for this? Thank you so much Keyairea!! Lisette Abu, LPN

## 2022-02-04 NOTE — Telephone Encounter (Signed)
Patient forgot top ask for referral to Podiatry for foot care related to her DM including nail triming.

## 2022-02-05 LAB — COMPREHENSIVE METABOLIC PANEL
ALT: 48 IU/L — ABNORMAL HIGH (ref 0–32)
AST: 43 IU/L — ABNORMAL HIGH (ref 0–40)
Albumin/Globulin Ratio: 1.5 (ref 1.2–2.2)
Albumin: 4.3 g/dL (ref 3.8–4.9)
Alkaline Phosphatase: 106 IU/L (ref 44–121)
BUN/Creatinine Ratio: 9 (ref 9–23)
BUN: 9 mg/dL (ref 6–24)
Bilirubin Total: 0.4 mg/dL (ref 0.0–1.2)
CO2: 23 mmol/L (ref 20–29)
Calcium: 9.4 mg/dL (ref 8.7–10.2)
Chloride: 99 mmol/L (ref 96–106)
Creatinine, Ser: 0.96 mg/dL (ref 0.57–1.00)
Globulin, Total: 2.9 g/dL (ref 1.5–4.5)
Glucose: 388 mg/dL — ABNORMAL HIGH (ref 70–99)
Potassium: 4.4 mmol/L (ref 3.5–5.2)
Sodium: 137 mmol/L (ref 134–144)
Total Protein: 7.2 g/dL (ref 6.0–8.5)
eGFR: 71 mL/min/{1.73_m2} (ref >59)

## 2022-02-05 NOTE — Telephone Encounter (Signed)
Attempted call to patient . Patient answered briefly but we were disconnected. Called right back but no answer.  Will call again at later time.

## 2022-02-05 NOTE — Telephone Encounter (Signed)
Noted. Referral was placed to Endocrine yesterday during OV

## 2022-02-07 LAB — MICROALBUMIN / CREATININE URINE RATIO
Creatinine, Urine: 108.5 mg/dL
Microalb/Creat Ratio: 16 mg/g creat (ref 0–29)
Microalbumin, Urine: 17.5 ug/mL

## 2022-02-08 ENCOUNTER — Telehealth: Payer: Self-pay

## 2022-02-08 DIAGNOSIS — U071 COVID-19: Secondary | ICD-10-CM

## 2022-02-08 MED ORDER — NIRMATRELVIR/RITONAVIR (PAXLOVID)TABLET: 3.0000 | ORAL_TABLET | Freq: Two times a day (BID) | ORAL | 0 refills | Status: AC

## 2022-02-08 NOTE — Telephone Encounter (Signed)
Patient informed that she has been referred to Endocrinology and that they will address the Jardiance prescription for her.

## 2022-02-08 NOTE — Addendum Note (Signed)
Addended by: Leeanne Rio on: 02/08/2022 04:23 PM   Modules accepted: Orders

## 2022-02-08 NOTE — Telephone Encounter (Signed)
Patient called- states was seen for nurse visit 02/05/22 on Mychart- having symptoms of congestion, nasal drainage, cough body aches, loss of appetite,  and feeling lightheaded. Had 2 positive home Coivd tests - patient states no medication could be started due to questionable kidney function.  She is not improved. Today would be the 5th day of symptoms. Requesting a medication be sent to pharmacy today.

## 2022-02-08 NOTE — Telephone Encounter (Signed)
Called patient - left detailed voice mail message on patient home # ==[phone]

## 2022-02-08 NOTE — Telephone Encounter (Addendum)
I have sent in Paxlovid to [facility] for pt. Hope she gets well soon.

## 2022-02-12 ENCOUNTER — Telehealth: Payer: Self-pay | Admitting: Family Medicine

## 2022-02-12 ENCOUNTER — Encounter: Payer: Self-pay | Admitting: Family Medicine

## 2022-02-12 ENCOUNTER — Telehealth (INDEPENDENT_AMBULATORY_CARE_PROVIDER_SITE_OTHER): Payer: Commercial Managed Care - HMO | Admitting: Family Medicine

## 2022-02-12 VITALS — BP 177/99 | HR 96 | Temp 98.1°F | Wt 260.0 lb

## 2022-02-12 DIAGNOSIS — J40 Bronchitis, not specified as acute or chronic: Secondary | ICD-10-CM | POA: Diagnosis not present

## 2022-02-12 DIAGNOSIS — U071 COVID-19: Secondary | ICD-10-CM | POA: Diagnosis not present

## 2022-02-12 MED ORDER — PROMETHAZINE-DM 6.25-15 MG/5ML PO SYRP: 5.0000 mL | ORAL_SOLUTION | Freq: Four times a day (QID) | ORAL | 0 refills | Status: DC | PRN

## 2022-02-12 NOTE — Telephone Encounter (Signed)
Patient called stating she no longer has COVID and feels its now bronchitis and would like a callback in regards to her scheduling a new appointment or [facility] visit.

## 2022-02-12 NOTE — Telephone Encounter (Signed)
Patient seen by provider by telephone visit today.

## 2022-02-12 NOTE — Progress Notes (Signed)
  I connected with  Joya Gaskins on 02/12/22 by a telephone visit and verified that I am speaking with the correct person using two identifiers.   I discussed the limitations of evaluation and management by telephone visit. The patient expressed understanding and agreed to proceed.  Acute Office Visit  Subjective:     Patient ID: Treniyah Lynn, female    DOB: 01-Jul-1970, 51 y.o.   MRN: [id number]  Chief Complaint  Patient presents with   continuing covid symptoms    Patient on phone - c/o continuing COVID symptoms,  nasal congestion producing yellow mucus ( was clear initially) , chest congestion and cough occasionally producing yellow mucus ( states initially was clear) , fatigue and bodyaches. Patient continuing to take Paxlovid currently.     HPI Patient is in today for acute visit.  Pt established with me on 12/7. Pt had sinus congestion at that time. She was given flonase and singulair. Pt called on 12/8 to inform us that she tested positive for covid. She was given Paxlovid on 12/11 and she is still on this. She has a deep cough with wheezing. She has got Albuterol inhaler and has used this 3 times a day. Still having cough not soothe with Paxlovid. Pt reports she thought her symptoms should resolve with Paxlovid.  Review of Systems  Respiratory:  Positive for cough and wheezing.   All other systems reviewed and are negative.       Objective:    BP (!) 177/99 Comment: patient self reporting  Pulse 96 Comment: patient self reporting  Temp 98.1 F (36.7 C) Comment: patient self reporting  Wt 260 lb (117.9 kg) Comment: patient self reporting  SpO2 95% Comment: patient self reporting  BMI 38.40 kg/m    Physical Exam Vitals and nursing note reviewed.    No results found for any visits on 02/12/22.      Assessment & Plan:   Problem List Items Addressed This Visit   None Visit Diagnoses     COVID-19    -  Primary   Relevant Medications    promethazine-dextromethorphan (PROMETHAZINE-DM) 6.25-15 MG/5ML syrup   Bronchitis         Discussed common symptoms of Covid with pt and informed her symptoms could last for weeks. Unable to give steroids as she is uncontrolled diabetic. Awaiting to get appt with Endocrinology. Sugar this morning 209.  Will send in cough syrup to use, advised to use albuterol as directed. May do honey/tea, vicks rub to chest. Humidifier or hot steamy shower will help. To go to ER for worsening chest pains or shortness of breath.  Meds ordered this encounter  Medications   promethazine-dextromethorphan (PROMETHAZINE-DM) 6.25-15 MG/5ML syrup    Sig: Take 5 mLs by mouth 4 (four) times daily as needed for cough.    Dispense:  118 mL    Refill:  0    No follow-ups on file.  Leeanne Rio, MD

## 2022-02-12 NOTE — Telephone Encounter (Signed)
If you can get her set up for mychart she can schedule a video visit with me

## 2022-02-18 ENCOUNTER — Telehealth: Payer: Self-pay | Admitting: Family Medicine

## 2022-02-18 DIAGNOSIS — U071 COVID-19: Secondary | ICD-10-CM

## 2022-02-18 MED ORDER — PROMETHAZINE-DM 6.25-15 MG/5ML PO SYRP: 5.0000 mL | ORAL_SOLUTION | Freq: Four times a day (QID) | ORAL | 0 refills | Status: DC | PRN

## 2022-02-18 NOTE — Telephone Encounter (Signed)
Refilled cough syrup

## 2022-02-18 NOTE — Telephone Encounter (Signed)
Patient called she still has a bad cough and ran out of the medication that was prescribed to her the cough syrup. She is asking for my syrup or what can be done at this?

## 2022-02-19 ENCOUNTER — Ambulatory Visit (INDEPENDENT_AMBULATORY_CARE_PROVIDER_SITE_OTHER): Payer: Commercial Managed Care - HMO | Admitting: Podiatry

## 2022-02-19 DIAGNOSIS — G629 Polyneuropathy, unspecified: Secondary | ICD-10-CM

## 2022-02-19 DIAGNOSIS — B351 Tinea unguium: Secondary | ICD-10-CM | POA: Diagnosis not present

## 2022-02-19 DIAGNOSIS — M79675 Pain in left toe(s): Secondary | ICD-10-CM | POA: Diagnosis not present

## 2022-02-19 DIAGNOSIS — M79674 Pain in right toe(s): Secondary | ICD-10-CM

## 2022-02-19 DIAGNOSIS — E1149 Type 2 diabetes mellitus with other diabetic neurological complication: Secondary | ICD-10-CM | POA: Diagnosis not present

## 2022-02-19 NOTE — Progress Notes (Signed)
Subjective:   Patient ID: Jean Calderon, female   DOB: 51 y.o.   MRN: [id number]   HPI Chief Complaint  Patient presents with  . Nail Problem    Routine foot care   . Foot Pain    Bilateral foot pain , patient states she has diabetes and neruopathy    A1c 14+  ROS      Objective:  Physical Exam  ***     Assessment:  ***     Plan:  ***

## 2022-02-24 ENCOUNTER — Telehealth: Payer: Self-pay | Admitting: Family Medicine

## 2022-02-24 DIAGNOSIS — U071 COVID-19: Secondary | ICD-10-CM

## 2022-02-24 DIAGNOSIS — E1165 Type 2 diabetes mellitus with hyperglycemia: Secondary | ICD-10-CM

## 2022-02-24 NOTE — Telephone Encounter (Signed)
Patient states per her Neurologist; Lyrica needs a refill and refill must be done by PCP. Please advise.Jean Calderon

## 2022-02-24 NOTE — Telephone Encounter (Signed)
Lyrica is refilled by the PCP usually; but I never got a refill request to refill it...? I can refill it. Where does she what the medicine refilled to? Please confirm pharmacy.

## 2022-02-25 ENCOUNTER — Other Ambulatory Visit: Payer: Self-pay | Admitting: Family Medicine

## 2022-02-25 DIAGNOSIS — U071 COVID-19: Secondary | ICD-10-CM

## 2022-02-25 MED ORDER — BENZONATATE 200 MG PO CAPS: 200.0000 mg | ORAL_CAPSULE | Freq: Two times a day (BID) | ORAL | 0 refills | Status: DC | PRN

## 2022-02-25 MED ORDER — PREGABALIN 200 MG PO CAPS: 200.0000 mg | ORAL_CAPSULE | Freq: Two times a day (BID) | ORAL | 3 refills | Status: DC

## 2022-02-25 NOTE — Addendum Note (Signed)
Addended by: Leeanne Rio on: 02/25/2022 02:18 PM   Modules accepted: Orders

## 2022-02-25 NOTE — Telephone Encounter (Signed)
Due to uncontrolled diabetes, sent in another cough suppressant different from the cough syrup. Refilled Lyrica

## 2022-02-25 NOTE — Telephone Encounter (Signed)
Called patient to inquire, preferred pharmacy is the [facility] on file. Patient also requested a refill on cough syrup because she is still coughing. Buel Ream

## 2022-02-25 NOTE — Telephone Encounter (Signed)
Due to uncontrolled diabetes, sent in Tessalon to use instead of refilling cough syrup. Has been using cough syrup for the last month.

## 2022-03-02 ENCOUNTER — Other Ambulatory Visit: Payer: Self-pay

## 2022-03-02 ENCOUNTER — Other Ambulatory Visit: Payer: Self-pay | Admitting: Family Medicine

## 2022-03-02 DIAGNOSIS — J301 Allergic rhinitis due to pollen: Secondary | ICD-10-CM

## 2022-03-02 NOTE — Telephone Encounter (Signed)
Patient states she did receive the cough suppressant and she is much improved.

## 2022-03-08 ENCOUNTER — Telehealth: Payer: Self-pay | Admitting: Family Medicine

## 2022-03-08 NOTE — Telephone Encounter (Signed)
Erroneous encounter. Jean Calderon

## 2022-03-09 ENCOUNTER — Ambulatory Visit (INDEPENDENT_AMBULATORY_CARE_PROVIDER_SITE_OTHER): Payer: BLUE CROSS/BLUE SHIELD | Admitting: Family Medicine

## 2022-03-09 ENCOUNTER — Encounter: Payer: Self-pay | Admitting: Family Medicine

## 2022-03-09 ENCOUNTER — Telehealth: Payer: Self-pay | Admitting: Family Medicine

## 2022-03-09 VITALS — BP 118/81 | HR 80 | Temp 98.2°F | Ht 69.0 in | Wt 277.2 lb

## 2022-03-09 DIAGNOSIS — E1165 Type 2 diabetes mellitus with hyperglycemia: Secondary | ICD-10-CM

## 2022-03-09 DIAGNOSIS — Z23 Encounter for immunization: Secondary | ICD-10-CM | POA: Diagnosis not present

## 2022-03-09 DIAGNOSIS — M5412 Radiculopathy, cervical region: Secondary | ICD-10-CM | POA: Diagnosis not present

## 2022-03-09 DIAGNOSIS — E1142 Type 2 diabetes mellitus with diabetic polyneuropathy: Secondary | ICD-10-CM | POA: Diagnosis not present

## 2022-03-09 DIAGNOSIS — Z9641 Presence of insulin pump (external) (internal): Secondary | ICD-10-CM

## 2022-03-09 MED ORDER — ATORVASTATIN CALCIUM 40 MG PO TABS: 40.0000 mg | ORAL_TABLET | Freq: Every day | ORAL | 1 refills | Status: DC

## 2022-03-09 MED ORDER — CYCLOBENZAPRINE HCL 10 MG PO TABS: 10.0000 mg | ORAL_TABLET | Freq: Three times a day (TID) | ORAL | 0 refills | Status: DC | PRN

## 2022-03-09 MED ORDER — INSULIN LISPRO 100 UNIT/ML IJ SOLN: INTRAMUSCULAR | 5 refills | Status: DC

## 2022-03-09 MED ORDER — INSULIN ASPART 100 UNIT/ML IJ SOLN: INTRAMUSCULAR | 5 refills | Status: DC

## 2022-03-09 NOTE — Progress Notes (Signed)
Established Patient Office Visit  Subjective   Patient ID: Jean Calderon, female    DOB: Aug 18, 1970  Age: 52 y.o. MRN: [id number]  Chief Complaint  Patient presents with   Follow-up    Patient requesting rx rf of humalog, atorvastatin, flexeril ( states old PCP had only approved one vial of the Humalog) - known neuropathy but now feels like walking on stones and shocking sensation- nerve study done - abnormal - schld for carpal tunnel soon to be scheduled. But must get blood sugar down to have the procedure.  Hands feel numb and frequently drops things. Wanting to discuss tdap and covid vaccines,    HPI  Pt here today for refills. Pt is uncontrolled diabetic with A1c more than 14 during initial visit. Insulin dependent and needed a referral to Endocrinology. This referral was placed to [place] who was scheduled out until Sept 2024. Referral has been changed to another facility but pt is yet to get an appointment. Sugars are still elevated. She says she uses about 60 units a day of insulin in her pump. She needs Humalog and Atorvastatin refilled. Has had diabetic eye exam in October 2023 and reports there was no retinopathy. Pt has diabetic neuropathy and uses Lyrica '200mg'$  BID for this. Not helping much. Reports sugars are still elevated on some days. Pt would like Tdap vaccine along with Covid vaccine (recently had Covid last month)  Pt has cervical radiculopathy. Uses Flexeril '10mg'$  TID prn for spasms in her neck. She also has carpal tunnel syndrome and needs surgery done for this but it's postponed until diabetes is under better control.   Review of Systems  Musculoskeletal:  Positive for neck pain.  Neurological:  Positive for tingling and sensory change.  All other systems reviewed and are negative.     Objective:     BP 118/81   Pulse 80   Temp 98.2 F (36.8 C)   Ht '5\' 9"'$  (1.753 m)   Wt 277 lb 4 oz (125.8 kg)   SpO2 96%   BMI 40.94 kg/m    Physical Exam Vitals and nursing  note reviewed.  Constitutional:      Appearance: Normal appearance. She is obese.  HENT:     Head: Normocephalic and atraumatic.     Right Ear: External ear normal.     Left Ear: External ear normal.     Nose: Nose normal.  Eyes:     Extraocular Movements: Extraocular movements intact.     Conjunctiva/sclera: Conjunctivae normal.  Cardiovascular:     Rate and Rhythm: Normal rate.  Pulmonary:     Effort: Pulmonary effort is normal.  Neurological:     General: No focal deficit present.     Mental Status: She is alert and oriented to person, place, and time. Mental status is at baseline.  Psychiatric:        Mood and Affect: Mood normal.        Behavior: Behavior normal.        Thought Content: Thought content normal.        Judgment: Judgment normal.     No results found for any visits on 03/09/22.    The 10-year ASCVD risk score (Arnett DK, et al., 2019) is: 11.1%    Assessment & Plan:   Problem List Items Addressed This Visit       Endocrine   Poorly controlled type 2 diabetes mellitus with peripheral neuropathy ([place]) - Primary   Relevant Medications   insulin  lispro (HUMALOG) 100 UNIT/ML injection   atorvastatin (LIPITOR) 40 MG tablet   cyclobenzaprine (FLEXERIL) 10 MG tablet     Nervous and Auditory   Cervical radiculopathy   Relevant Medications   cyclobenzaprine (FLEXERIL) 10 MG tablet   Other Visit Diagnoses     Need for Tdap vaccination       Relevant Orders   Tdap vaccine greater than or equal to 7yo IM      Refilled Humalog, Atrovastatin for Diabetes Refilled Flexeril to use for cervical radicular pain that is chronic. Pt to see Ortho for CTS surgery tomorrow. Will work on Endocrinology referral for insulin pump management.  Tdap today. Advised pt since she just had Covid in Dec 2023, vaccine isn't indicated at this time until after 90 days.   No follow-ups on file.    Leeanne Rio, MD

## 2022-03-09 NOTE — Telephone Encounter (Signed)
Removed Humalog and sent in Novolog instead to use for insulin pump.

## 2022-03-09 NOTE — Telephone Encounter (Signed)
Called and left message on patient home #  that new script had been sent in.

## 2022-03-09 NOTE — Telephone Encounter (Signed)
Patient states that the insulin called in to the pharmacy is not covered by insurance and patient cannot afford to purchase it. Patient is requesting a different brand (it sounds a little bit like lexapro). Jean Calderon

## 2022-03-10 ENCOUNTER — Encounter: Payer: Self-pay | Admitting: Emergency Medicine

## 2022-03-10 ENCOUNTER — Ambulatory Visit (INDEPENDENT_AMBULATORY_CARE_PROVIDER_SITE_OTHER): Payer: BLUE CROSS/BLUE SHIELD

## 2022-03-10 ENCOUNTER — Ambulatory Visit
Admission: EM | Admit: 2022-03-10 | Discharge: 2022-03-10 | Disposition: A | Discharge status: Home / Self Care | Payer: BLUE CROSS/BLUE SHIELD | Attending: Family Medicine | Admitting: Family Medicine

## 2022-03-10 DIAGNOSIS — S90851A Superficial foreign body, right foot, initial encounter: Secondary | ICD-10-CM

## 2022-03-10 DIAGNOSIS — M79671 Pain in right foot: Secondary | ICD-10-CM | POA: Diagnosis not present

## 2022-03-10 DIAGNOSIS — M79672 Pain in left foot: Secondary | ICD-10-CM | POA: Diagnosis not present

## 2022-03-10 DIAGNOSIS — G629 Polyneuropathy, unspecified: Secondary | ICD-10-CM | POA: Diagnosis not present

## 2022-03-10 MED ORDER — HYDROCODONE-ACETAMINOPHEN 7.5-325 MG PO TABS: 1.0000 | ORAL_TABLET | Freq: Four times a day (QID) | ORAL | 0 refills | Status: DC | PRN

## 2022-03-10 NOTE — ED Provider Notes (Signed)
[facility]    CSN: [id number] Arrival date & time: 03/10/22  1521      History   Chief Complaint Chief Complaint  Patient presents with   Foot Pain    HPI Jean Calderon is a 52 y.o. female.   HPI  Patient noticed pain in the bottom of her foot yesterday.  She states there is a swollen knot.  Very painful to touch.  Does not recall getting a foreign body.  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  She has multiple medical problems including chronic kidney disease hypertension coronary artery disease osteoarthritis pain multiple joints hyperlipidemia.  Past Medical History:  Diagnosis Date   CAD (coronary artery disease)    CKD (chronic kidney disease), stage II    COVID-19 01/2022   DM (diabetes mellitus) ([place])    HTN (hypertension)    Malignant hyperthermia    Myocardial infarction [facility])    Neuropathy     Patient Active Problem List   Diagnosis Date Noted   Ulnar neuropathy of both upper extremities 12/23/2021   Hyperlipidemia 10/01/2021   Diabetic neuropathy ([place]) 09/30/2021   Meralgia paresthetica, right lower limb 09/30/2021   Gait instability 07/30/2021   Migraine without status migrainosus, not intractable 07/30/2021   Poorly controlled type 2 diabetes mellitus with peripheral neuropathy ([place]) 06/05/2021   Menopausal symptoms 04/15/2021   Anxiety 03/25/2021   CAD (coronary artery disease)    HTN (hypertension)    CKD (chronic kidney disease), stage II    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   Heart valve disease 01/06/2021   Vitamin B12 deficiency 12/03/2020   Vitamin D deficiency 12/03/2020   Chronic left shoulder pain 11/25/2020   Generalized anxiety disorder 11/25/2020   Psoriasis 01/01/2020   Lumbar radiculopathy 06/21/2019   Uterine leiomyoma 03/06/2019   Depression with anxiety 02/02/2018   Primary insomnia 02/02/2018   Class 2 severe obesity due to excess calories with serious comorbidity and body mass index (BMI) of 38.0 to  38.9 in adult ([place]) 10/12/2017   Insulin pump in place 10/12/2017   Cervical radiculopathy 08/15/2017   Family history of early CAD 07/21/2017   Obstructive sleep apnea syndrome 07/21/2017   Chronic right shoulder pain 05/30/2017   Type 2 diabetes mellitus with mild nonproliferative diabetic retinopathy without macular edema, bilateral ([place]) 03/18/2017   Congestive heart failure ([place]) 03/16/2017   Other cervical disc degeneration, unspecified cervical region 11/11/2016   Adhesive capsulitis of right shoulder 10/08/2016   GERD (gastroesophageal reflux disease) 07/19/2016   Spinal stenosis of cervical region 12/01/2015   Bilateral carpal tunnel syndrome 11/04/2015   Lesion of ulnar nerve, left upper limb 11/04/2015   Stented coronary artery 07/07/2015    Past Surgical History:  Procedure Laterality Date   APPENDECTOMY     BACK SURGERY     CESAREAN SECTION     CORONARY ANGIOPLASTY WITH STENT PLACEMENT     MULTIPLE TOOTH EXTRACTIONS     ROTATOR CUFF REPAIR      OB History   No obstetric history on file.      Home Medications    Prior to Admission medications   Medication Sig Start Date End Date Taking? Authorizing Provider  albuterol (VENTOLIN HFA) 108 (90 Base) MCG/ACT inhaler Inhale 2 puffs into the lungs every 4 (four) hours as needed for shortness of breath or wheezing. 01/22/22  Yes Kandra Nicolas, MD  aspirin EC 81 MG tablet Take 81 mg by mouth daily.   Yes [provider]  atorvastatin (LIPITOR) 40 MG tablet Take 1 tablet (40 mg total) by mouth daily. 03/09/22  Yes Rucker, Nicole Kindred, MD  cetirizine (ZYRTEC) 10 MG tablet Take 10 mg by mouth daily.   Yes [provider]  cloNIDine (CATAPRES) 0.1 MG tablet Take 0.1 mg by mouth 2 (two) times daily. 01/08/21  Yes [provider]  clopidogrel (PLAVIX) 75 MG tablet Take 75 mg by mouth daily. 11/25/20  Yes [provider]  Continuous Blood Gluc Sensor (DEXCOM G6 SENSOR) MISC by Misc.(Non-Drug;  Combo Route) route   Yes [provider]  Continuous Blood Gluc Transmit (DEXCOM G6 TRANSMITTER) MISC Use as directed for continuous glucose monitoring. Reuse transmitter for 90 days then discard and replace. 01/25/22  Yes [provider]  Crisaborole (EUCRISA) 2 % OINT Apply 1 Application topically 2 (two) times daily. 06/11/20  Yes [provider]  cyclobenzaprine (FLEXERIL) 10 MG tablet Take 1 tablet (10 mg total) by mouth 3 (three) times daily as needed for muscle spasms. 03/09/22  Yes Rucker, Nicole Kindred, MD  Estradiol-Norethindrone Acet 0.5-0.1 MG tablet Take 1 tablet by mouth daily. 12/18/20  Yes [provider]  fluticasone (FLONASE) 50 MCG/ACT nasal spray SHAKE LIQUID AND USE 2 SPRAYS IN EACH NOSTRIL DAILY 03/02/22  Yes Rucker, Nicole Kindred, MD  HYDROcodone-acetaminophen (NORCO) 7.5-325 MG tablet Take 1 tablet by mouth every 6 (six) hours as needed for moderate pain. 03/10/22  Yes Raylene Everts, MD  insulin aspart (NOVOLOG) 100 UNIT/ML injection For use with insulin pump, max dose in 24 hours 60 units 03/09/22  Yes Rucker, Nicole Kindred, MD  Insulin Disposable Pump (OMNIPOD 5 G6 POD, GEN 5,) MISC SMARTSIG:SUB-Q Every Other Day 12/02/20  Yes [provider]  isosorbide mononitrate (IMDUR) 30 MG 24 hr tablet Take 1 tablet (30 mg total) by mouth daily. 02/04/22  Yes Rucker, Nicole Kindred, MD  lisinopril-hydrochlorothiazide (ZESTORETIC) 20-12.5 MG tablet Take 1 tablet by mouth daily. 12/04/20  Yes [provider]  metoprolol succinate (TOPROL-XL) 100 MG 24 hr tablet Take 100 mg by mouth daily. 12/04/20  Yes [provider]  metoprolol succinate (TOPROL-XL) 100 MG 24 hr tablet Take 1 tablet by mouth daily. 07/20/21  Yes [provider]  montelukast (SINGULAIR) 10 MG tablet Take 1 tablet (10 mg total) by mouth at bedtime. 02/04/22  Yes Rucker, Nicole Kindred, MD  nitroGLYCERIN (NITROSTAT) 0.4 MG SL tablet Place 0.4 mg under the tongue every 5 (five)  minutes as needed for chest pain. 01/05/21  Yes [provider]  pregabalin (LYRICA) 200 MG capsule Take 1 capsule (200 mg total) by mouth 2 (two) times daily. 02/25/22  Yes Rucker, Nicole Kindred, MD  SKYRIZI PEN 150 MG/ML SOAJ Inject 1 mL into the skin every 3 (three) months. 02/24/21  Yes [provider]  topiramate (TOPAMAX) 100 MG tablet Take by mouth 2 (two) times daily. 08/17/21  Yes [provider]  traZODone (DESYREL) 50 MG tablet Take 0.5-1 tablets (25-50 mg total) by mouth at bedtime as needed for sleep. 02/04/22  Yes Leeanne Rio, MD  venlafaxine XR (EFFEXOR XR) 37.5 MG 24 hr capsule Take 1 capsule (37.5 mg total) by mouth daily with breakfast. 02/04/22  Yes Rucker, Nicole Kindred, MD    Family History Family History  Problem Relation Age of Onset   Heart disease Mother    Heart disease Sister     Social History Social History   Tobacco Use   Smoking status: Former    Types: Cigarettes  Smokeless tobacco: Never  Vaping Use   Vaping Use: Never used  Substance Use Topics   Alcohol use: Not Currently   Drug use: Never     Allergies   Tramadol, Nsaids, Sulfamethoxazole-trimethoprim, and Ibuprofen   Review of Systems Review of Systems See HPI  Physical Exam Triage Vital Signs ED Triage Vitals  Enc Vitals Group     BP 03/10/22 1542 123/79     Pulse Rate 03/10/22 1542 89     Resp 03/10/22 1542 18     Temp 03/10/22 1542 98.2 F (36.8 C)     Temp Source 03/10/22 1542 Oral     SpO2 03/10/22 1542 96 %     Weight 03/10/22 1544 277 lb (125.6 kg)     Height 03/10/22 1544 '5\' 9"'$  (1.753 m)     Head Circumference --      Peak Flow --      Pain Score 03/10/22 1543 10     Pain Loc --      Pain Edu? --      Excl. in [place]? --    No data found.  Updated Vital Signs BP 123/79 (BP Location: Right Arm)   Pulse 89   Temp 98.2 F (36.8 C) (Oral)   Resp 18   Ht '5\' 9"'$  (1.753 m)   Wt 125.6 kg   SpO2 96%   BMI 40.91 kg/m      Physical  Exam Constitutional:      General: She is not in acute distress.    Appearance: She is well-developed. She is obese.  HENT:     Head: Normocephalic and atraumatic.  Eyes:     Conjunctiva/sclera: Conjunctivae normal.     Pupils: Pupils are equal, round, and reactive to light.  Cardiovascular:     Rate and Rhythm: Normal rate.  Pulmonary:     Effort: Pulmonary effort is normal. No respiratory distress.  Abdominal:     General: There is no distension.     Palpations: Abdomen is soft.  Musculoskeletal:        General: Normal range of motion.     Cervical back: Normal range of motion.       Feet:  Skin:    General: Skin is warm and dry.  Neurological:     Mental Status: She is alert.      UC Treatments / Results  Labs (all labs ordered are listed, but only abnormal results are displayed) Labs Reviewed - No data to display  EKG   Radiology DG Foot Complete Right  Result Date: 03/10/2022 CLINICAL DATA:  Bilateral foot pain and neuropathy. Feels something on plantar surface of right foot now hurts when weight-bearing. EXAM: RIGHT FOOT COMPLETE - 3+ VIEW COMPARISON:  None Available. FINDINGS: Mild plantar and posterior calcaneal heel spurs. Minimal distal dorsal talar neck and mild adjacent posterior dorsal navicular degenerative spurring. There is dorsal posterior navicular subchondral sclerosis degenerative change. Minimal degenerative spurring at the lateral aspect of the great toe metatarsophalangeal joint, especially the lateral base of the proximal phalanx as seen on oblique view. Tiny ossicle just distal to the medial malleolus, likely the sequela of remote trauma, visualized on oblique view. No acute fracture or dislocation. No cortical erosion or periostitis. IMPRESSION: 1. Mild plantar and posterior calcaneal heel spurs. 2. Mild talonavicular osteoarthritis. Electronically Signed   By: Katalyn Matin Kendall M.D.   On: 03/10/2022 15:57   DG Foot Complete Left  Result Date:  03/10/2022 CLINICAL DATA:  Bilateral foot  pain and neuropathy. EXAM: LEFT FOOT - COMPLETE 3+ VIEW COMPARISON:  Left fifth toe radiographs 11/08/2020 FINDINGS: Normal bone mineralization. Moderate plantar and mild posterior calcaneal heel spurs. Mild dorsal talonavicular degenerative osteophytosis on lateral view. Mild degenerative spurring at the dorsal medial talar neck at the medial tibiotalar articulation, seen on oblique view. No acute fracture or dislocation. No cortical erosion or periostitis. IMPRESSION: 1. Moderate plantar and mild posterior calcaneal heel spurs. 2. Mild degenerative spurring at the medial tibiotalar articulation. Electronically Signed   By: Alix Lahmann Kendall M.D.   On: 03/10/2022 15:55    Procedures Foreign Body Removal  Date/Time: 03/10/2022 4:52 PM  Performed by: Raylene Everts, MD Authorized by: Raylene Everts, MD   Consent:    Consent obtained:  Verbal   Consent given by:  Patient   Risks discussed:  Infection   Alternatives discussed:  Referral Universal protocol:    Patient identity confirmed:  Verbally with patient Location:    Location:  Foot   Foot location:  R sole   Depth:  Subcutaneous   Tendon involvement:  None Anesthesia:    Anesthesia method:  None Procedure type:    Procedure complexity:  Simple Procedure details:    Incision length:  Area shaved with scalpel.  Opened a 5 mm defect   Localization method:  Probed   Bloodless field: yes     Removal mechanism:  Forceps Post-procedure details:    Neurovascular status: deficits     Dressing:  Antibiotic ointment   Procedure completion:  Tolerated Comments:     The foreign body appeared to be a sliver of metal.  It was encapsulated and a small abscess.  The skin was debrided.  Foreign body removed with forceps  (including critical care time)  Medications Ordered in UC Medications - No data to display  Initial Impression / Assessment and Plan / UC Course  I have reviewed the triage  vital signs and the nursing notes.  Pertinent labs & imaging results that were available during my care of the patient were reviewed by me and considered in my medical decision making (see chart for details).     Wound care discussed Final Clinical Impressions(s) / UC Diagnoses   Final diagnoses:  Foreign body in right foot, initial encounter  Foot pain, right     Discharge Instructions      Wash daily and apply antibiotic ointment, band aid Watch for infection Take pain medicine as needed Call for problems   ED Prescriptions     Medication Sig Dispense Auth. Provider   HYDROcodone-acetaminophen (NORCO) 7.5-325 MG tablet Take 1 tablet by mouth every 6 (six) hours as needed for moderate pain. 6 tablet Raylene Everts, MD      I have reviewed the PDMP during this encounter.   Raylene Everts, MD 03/10/22 [phone]

## 2022-03-10 NOTE — ED Notes (Signed)
Pt to take narcotic pain meds for pain as prescribed for 10/10 pain

## 2022-03-10 NOTE — ED Triage Notes (Signed)
Patient states that she has neuropathy in her feet from having DM Type 1.  She noticed a knot on the bottom of her right foot.  She states theres definitely a FB in foot.  Patient has taken Tylenol w/o relief.

## 2022-03-10 NOTE — Discharge Instructions (Addendum)
Wash daily and apply antibiotic ointment, band aid Watch for infection Take pain medicine as needed Call for problems

## 2022-03-11 ENCOUNTER — Telehealth: Payer: Self-pay

## 2022-03-11 NOTE — Telephone Encounter (Signed)
Called pt to check on status since UC visit. States foot is still a bit sore but is putting abx ointment on it. F/u with PCP or here if sxs worsen. Advised to call back if any questions.

## 2022-03-25 ENCOUNTER — Ambulatory Visit (HOSPITAL_COMMUNITY): Payer: Medicaid Other | Admitting: Psychiatry

## 2022-03-28 ENCOUNTER — Other Ambulatory Visit: Payer: Self-pay | Admitting: Family Medicine

## 2022-03-28 DIAGNOSIS — M5412 Radiculopathy, cervical region: Secondary | ICD-10-CM

## 2022-03-29 ENCOUNTER — Telehealth: Payer: Self-pay | Admitting: Family Medicine

## 2022-03-29 NOTE — Telephone Encounter (Signed)
Patient made aware. Jean Calderon

## 2022-03-29 NOTE — Telephone Encounter (Signed)
Patient called and states [facility] sent request for muscle relaxer to be refilled but wanted to send in a request for that refill herself. Patient requested notification of refill approval or denial at number on file. Please advise. Jean Calderon

## 2022-03-31 ENCOUNTER — Ambulatory Visit (HOSPITAL_COMMUNITY): Payer: Medicaid Other | Admitting: Psychiatry

## 2022-03-31 ENCOUNTER — Encounter (HOSPITAL_COMMUNITY): Payer: Self-pay

## 2022-04-05 ENCOUNTER — Telehealth (INDEPENDENT_AMBULATORY_CARE_PROVIDER_SITE_OTHER): Payer: BLUE CROSS/BLUE SHIELD | Admitting: Family Medicine

## 2022-04-05 ENCOUNTER — Encounter: Payer: Self-pay | Admitting: Family Medicine

## 2022-04-05 VITALS — BP 135/86

## 2022-04-05 DIAGNOSIS — M5416 Radiculopathy, lumbar region: Secondary | ICD-10-CM | POA: Diagnosis not present

## 2022-04-05 DIAGNOSIS — E1142 Type 2 diabetes mellitus with diabetic polyneuropathy: Secondary | ICD-10-CM

## 2022-04-05 DIAGNOSIS — Z9641 Presence of insulin pump (external) (internal): Secondary | ICD-10-CM

## 2022-04-05 DIAGNOSIS — E1165 Type 2 diabetes mellitus with hyperglycemia: Secondary | ICD-10-CM

## 2022-04-05 DIAGNOSIS — F418 Other specified anxiety disorders: Secondary | ICD-10-CM

## 2022-04-05 MED ORDER — DEXCOM G7 RECEIVER DEVI: 2 refills | Status: AC

## 2022-04-05 MED ORDER — DEXCOM G7 SENSOR MISC: 5 refills | Status: AC

## 2022-04-05 NOTE — Progress Notes (Addendum)
Acute Office Visit  Virtual Visit via Video Note  I connected with Jean Calderon on 04/05/22 at  3:10 PM EST by a video enabled telemedicine application and verified that I am speaking with the correct person using two identifiers.  Location: Patient: Home in [place]  Provider: [facility] [place]   I discussed the limitations of evaluation and management by telemedicine and the availability of in person appointments. The patient expressed understanding and agreed to proceed.    I discussed the assessment and treatment plan with the patient. The patient was provided an opportunity to ask questions and all were answered. The patient agreed with the plan and demonstrated an understanding of the instructions.   The patient was advised to call back or seek an in-person evaluation if the symptoms worsen or if the condition fails to improve as anticipated.  I provided 5 minutes of non-face-to-face time during this encounter.   Leeanne Rio, MD  Subjective:     Patient ID: Jean Calderon, female    DOB: January 05, 1971, 52 y.o.   MRN: [id number]  Chief Complaint  Patient presents with   Needs Referrals    HPI Patient is in today for acute visit.  Pt presents via telehealth appointment today due to needing new referrals. She states she had appointment with [facility] in [place] but missed 2 appointments due to car trouble and illness in the family. She says that provider will no longer see her and she is needing a new referral. She is ok to go out of town for this.   She also says she was established with Dr Tamala Julian with [facility] for her diabetes. She doesn't feel comfortable seeing him and would like to go somewhere else. They wouldn't allow her to see another provider within the same office. [facility] denied referral due to being pushed out for appointments until the fall. Pt has a dexcom 6 and states her insurance has required her to  get Dexcom 7. Would like this prescription sent to pharmacy.  She has hx of DDD of L spine. She reports worsening pain and spasms down her right buttock. She is taking Flexeril but isn't helping. Pain is worsened when getting up and down out of the car or going to stand. Pt is also taking Lyrica and got Hydrocodone from urgent care this month. Pt would like to pursue pain management.   Patient Active Problem List   Diagnosis Date Noted   Ulnar neuropathy of both upper extremities 12/23/2021   Hyperlipidemia 10/01/2021   Diabetic neuropathy ([place]) 09/30/2021   Meralgia paresthetica, right lower limb 09/30/2021   Gait instability 07/30/2021   Migraine without status migrainosus, not intractable 07/30/2021   Poorly controlled type 2 diabetes mellitus with peripheral neuropathy ([place]) 06/05/2021   Menopausal symptoms 04/15/2021   Anxiety 03/25/2021   CAD (coronary artery disease)    HTN (hypertension)    CKD (chronic kidney disease), stage II    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   Heart valve disease 01/06/2021   Vitamin B12 deficiency 12/03/2020   Vitamin D deficiency 12/03/2020   Chronic left shoulder pain 11/25/2020   Generalized anxiety disorder 11/25/2020   Psoriasis 01/01/2020   Lumbar radiculopathy 06/21/2019   Uterine leiomyoma 03/06/2019   Depression with anxiety 02/02/2018   Primary insomnia 02/02/2018   Class 2 severe obesity due to excess calories with serious comorbidity and body mass index (BMI) of 38.0 to 38.9 in adult [facility]) 10/12/2017  Insulin pump in place 10/12/2017   Cervical radiculopathy 08/15/2017   Family history of early CAD 07/21/2017   Obstructive sleep apnea syndrome 07/21/2017   Chronic right shoulder pain 05/30/2017   Type 2 diabetes mellitus with mild nonproliferative diabetic retinopathy without macular edema, bilateral ([place]) 03/18/2017   Congestive heart failure ([place]) 03/16/2017   Other cervical disc degeneration, unspecified cervical region  11/11/2016   Adhesive capsulitis of right shoulder 10/08/2016   GERD (gastroesophageal reflux disease) 07/19/2016   Spinal stenosis of cervical region 12/01/2015   Bilateral carpal tunnel syndrome 11/04/2015   Lesion of ulnar nerve, left upper limb 11/04/2015   Stented coronary artery 07/07/2015    Review of Systems  Musculoskeletal:  Positive for back pain and joint pain.  All other systems reviewed and are negative.       Objective:    BP 135/86    Physical Exam Vitals and nursing note reviewed.  Constitutional:      Appearance: Normal appearance. She is obese.  HENT:     Head: Normocephalic and atraumatic.     Right Ear: External ear normal.     Left Ear: External ear normal.     Nose: Nose normal.  Eyes:     Conjunctiva/sclera: Conjunctivae normal.  Pulmonary:     Effort: Pulmonary effort is normal.  Neurological:     General: No focal deficit present.     Mental Status: She is alert and oriented to person, place, and time. Mental status is at baseline.  Psychiatric:        Mood and Affect: Mood normal.        Behavior: Behavior normal.        Thought Content: Thought content normal.        Judgment: Judgment normal.    No results found for any visits on 04/05/22.      Assessment & Plan:   Problem List Items Addressed This Visit       Endocrine   Poorly controlled type 2 diabetes mellitus with peripheral neuropathy ([place]) - Primary   Relevant Medications   Continuous Blood Gluc Receiver ([place]) DEVI   Continuous Blood Gluc Sensor ([place]) MISC   Other Relevant Orders   Ambulatory referral to Endocrinology     Nervous and Auditory   Lumbar radiculopathy   Relevant Orders   Ambulatory referral to Physical Medicine Rehab     Other   Depression with anxiety   Relevant Orders   Ambulatory referral to Behavioral Health   Insulin pump in place   Relevant Medications   Continuous Blood Gluc Receiver (DEXCOM G7 RECEIVER) DEVI    Continuous Blood Gluc Sensor ([place]) MISC   Other Relevant Orders   Ambulatory referral to Endocrinology    Meds ordered this encounter  Medications   Continuous Blood Gluc Receiver ([place]) DEVI    Sig: Check sugars daily Dx E11.9    Dispense:  1 each    Refill:  2   Continuous Blood Gluc Sensor (DEXCOM G7 SENSOR) MISC    Sig: Check sugars daily Dx E11.9    Dispense:  1 each    Refill:  5   Sent Dexcom 7 rx to pharmacy for checking sugars.  Refer to [facility] in [place] since [place] is full and pt doesn't want to go back to [facility]. Refer to [facility] for pain management. MRI L spine done 07/2021 for review and showed DDD and foraminal  stenosis. Refer to [facility] in [place].  No follow-ups on file. Received notification that the [facility] referral was denied as it was [facility] and was out of network.  Will try sending to [facility]. Leeanne Rio, MD

## 2022-04-05 NOTE — Addendum Note (Signed)
Addended by: Leeanne Rio on: 04/05/2022 03:47 PM   Modules accepted: Orders

## 2022-04-06 ENCOUNTER — Telehealth: Payer: Self-pay | Admitting: Family Medicine

## 2022-04-06 DIAGNOSIS — E1142 Type 2 diabetes mellitus with diabetic polyneuropathy: Secondary | ICD-10-CM

## 2022-04-06 MED ORDER — PREGABALIN 200 MG PO CAPS: 200.0000 mg | ORAL_CAPSULE | Freq: Three times a day (TID) | ORAL | 0 refills | Status: DC

## 2022-04-06 NOTE — Telephone Encounter (Signed)
Patient called to notify provider that [facility] requires prior authorization for G7 (per patient) before it can be filled. Additionally, patient requested different medication for back pain, states the Tramadol is not working and nerve pain is really bothering her right now. Please advise. Jean Calderon

## 2022-04-06 NOTE — Telephone Encounter (Signed)
[facility] handles the PA's and will be working on it. She can go up to Lyrica '200mg'$  3 times a day from 2 times a day. She's been referred to pain management and awaiting appointment.

## 2022-04-06 NOTE — Telephone Encounter (Signed)
Gave pt message below. She voiced understanding and agrees with the plan to go up on Lyrica.

## 2022-04-06 NOTE — Telephone Encounter (Signed)
Pt needs to have [facility] start prior auth, so that they can fax over a Key # from [place] [facility] [employer].

## 2022-04-08 ENCOUNTER — Telehealth: Payer: Self-pay | Admitting: Podiatry

## 2022-04-08 ENCOUNTER — Encounter: Payer: Self-pay | Admitting: Podiatry

## 2022-04-08 NOTE — Telephone Encounter (Signed)
Pt was called to r/s her appt with Dr Blenda Mounts in [place] and I rescheduled it. Upon running her new insurance the bcbs she currently has is out of network . She is aware and is starting a new job next week with new insurance and will call once she gets this information for the new insurance.

## 2022-04-09 ENCOUNTER — Ambulatory Visit
Admission: EM | Admit: 2022-04-09 | Discharge: 2022-04-09 | Disposition: A | Discharge status: Home / Self Care | Payer: Medicaid Other | Attending: Urgent Care | Admitting: Urgent Care

## 2022-04-09 ENCOUNTER — Other Ambulatory Visit: Payer: Self-pay | Admitting: Family Medicine

## 2022-04-09 ENCOUNTER — Encounter: Payer: Self-pay | Admitting: Emergency Medicine

## 2022-04-09 DIAGNOSIS — E119 Type 2 diabetes mellitus without complications: Secondary | ICD-10-CM

## 2022-04-09 DIAGNOSIS — M5432 Sciatica, left side: Secondary | ICD-10-CM | POA: Diagnosis not present

## 2022-04-09 DIAGNOSIS — Z794 Long term (current) use of insulin: Secondary | ICD-10-CM

## 2022-04-09 DIAGNOSIS — M5412 Radiculopathy, cervical region: Secondary | ICD-10-CM

## 2022-04-09 MED ORDER — ACETAMINOPHEN 325 MG PO TABS: 650.0000 mg | ORAL_TABLET | Freq: Four times a day (QID) | ORAL | 0 refills | Status: DC | PRN

## 2022-04-09 MED ORDER — HYDROCODONE-ACETAMINOPHEN 5-325 MG PO TABS: 1.0000 | ORAL_TABLET | Freq: Four times a day (QID) | ORAL | 0 refills | Status: DC | PRN

## 2022-04-09 NOTE — Discharge Instructions (Addendum)
Please schedule Tylenol at 500 mg - 650 mg once every 6 hours as needed for aches and pains.  If you still have pain despite taking Tylenol regularly, this is breakthrough pain.  You can use hydrocodone once every 6 hours for this.  Once your pain is better controlled, switch back to just Tylenol.  Make sure you follow-up as soon as possible with your regular doctor.  Hydrocodone is a narcotic pain medication and is monitored by the DEA.  It is important that you obtain prescriptions from your regular doctor or your pain management doctor.

## 2022-04-09 NOTE — ED Provider Notes (Signed)
[facility]  Note:  This document was prepared using Dragon voice recognition software and may include unintentional dictation errors.  MRN: [id number] DOB: 11/15/1970  Subjective:   Jean Calderon is a 52 y.o. female presenting for 1 day history of recurrent left-sided low back/buttock pain that radiates posteriorly down her entire left leg and into the foot.  Feels a pulling sensation along her muscles.  No fall, trauma, numbness or tingling, saddle paresthesia, changes to bowel or urinary habits. Patient is a severely uncontrolled diabetic with an A1c greater than 14% seen February 04, 2022.  Patient has been advised to avoid all NSAIDs and she cannot tolerate tramadol.  She actually takes Lyrica as well for neuropathy.  No current facility-administered medications for this encounter.  Current Outpatient Medications:    albuterol (VENTOLIN HFA) 108 (90 Base) MCG/ACT inhaler, Inhale 2 puffs into the lungs every 4 (four) hours as needed for shortness of breath or wheezing., Disp: 1 each, Rfl: 1   aspirin EC 81 MG tablet, Take 81 mg by mouth daily., Disp: , Rfl:    atorvastatin (LIPITOR) 40 MG tablet, Take 1 tablet (40 mg total) by mouth daily., Disp: 90 tablet, Rfl: 1   cetirizine (ZYRTEC) 10 MG tablet, Take 10 mg by mouth daily., Disp: , Rfl:    cloNIDine (CATAPRES) 0.1 MG tablet, Take 0.1 mg by mouth 2 (two) times daily., Disp: , Rfl:    clopidogrel (PLAVIX) 75 MG tablet, Take 75 mg by mouth daily., Disp: , Rfl:    Continuous Blood Gluc Receiver ([place]) DEVI, Check sugars daily Dx E11.9, Disp: 1 each, Rfl: 2   Continuous Blood Gluc Sensor (DEXCOM G7 SENSOR) MISC, Check sugars daily Dx E11.9, Disp: 1 each, Rfl: 5   Crisaborole (EUCRISA) 2 % OINT, Apply 1 Application topically 2 (two) times daily., Disp: , Rfl:    cyclobenzaprine (FLEXERIL) 10 MG tablet, TAKE 1 TABLET(10 MG) BY MOUTH THREE TIMES DAILY AS NEEDED FOR MUSCLE SPASMS, Disp: 30 tablet, Rfl: 0    Estradiol-Norethindrone Acet 0.5-0.1 MG tablet, Take 1 tablet by mouth daily., Disp: , Rfl:    fluticasone (FLONASE) 50 MCG/ACT nasal spray, SHAKE LIQUID AND USE 2 SPRAYS IN EACH NOSTRIL DAILY, Disp: 48 g, Rfl: 0   insulin aspart (NOVOLOG) 100 UNIT/ML injection, For use with insulin pump, max dose in 24 hours 60 units, Disp: 10 mL, Rfl: 5   Insulin Disposable Pump (OMNIPOD 5 G6 POD, GEN 5,) MISC, SMARTSIG:SUB-Q Every Other Day, Disp: , Rfl:    isosorbide mononitrate (IMDUR) 30 MG 24 hr tablet, Take 1 tablet (30 mg total) by mouth daily., Disp: 90 tablet, Rfl: 1   lisinopril-hydrochlorothiazide (ZESTORETIC) 20-12.5 MG tablet, Take 1 tablet by mouth daily., Disp: , Rfl:    metoprolol succinate (TOPROL-XL) 100 MG 24 hr tablet, Take 100 mg by mouth daily., Disp: , Rfl:    metoprolol succinate (TOPROL-XL) 100 MG 24 hr tablet, Take 1 tablet by mouth daily., Disp: , Rfl:    montelukast (SINGULAIR) 10 MG tablet, Take 1 tablet (10 mg total) by mouth at bedtime., Disp: 30 tablet, Rfl: 3   nitroGLYCERIN (NITROSTAT) 0.4 MG SL tablet, Place 0.4 mg under the tongue every 5 (five) minutes as needed for chest pain., Disp: , Rfl:    pregabalin (LYRICA) 200 MG capsule, Take 1 capsule (200 mg total) by mouth 3 (three) times daily., Disp: 90 capsule, Rfl: 0   SKYRIZI PEN 150 MG/ML SOAJ, Inject 1 mL into the skin  every 3 (three) months., Disp: , Rfl:    topiramate (TOPAMAX) 100 MG tablet, Take by mouth 2 (two) times daily., Disp: , Rfl:    traZODone (DESYREL) 50 MG tablet, Take 0.5-1 tablets (25-50 mg total) by mouth at bedtime as needed for sleep., Disp: 30 tablet, Rfl: 3   venlafaxine XR (EFFEXOR XR) 37.5 MG 24 hr capsule, Take 1 capsule (37.5 mg total) by mouth daily with breakfast., Disp: 90 capsule, Rfl: 1   Allergies  Allergen Reactions   Tramadol     Other reaction(s): Mental Status Changes (intolerance) Caused Hallucinations   Nsaids Other (See Comments)    Cannot take because she's on Plavix.   Other  reaction(s): Bleeding (intolerance) Cannot take because she's on Plavix.    Sulfamethoxazole-Trimethoprim Nausea And Vomiting    History of Colitis   Ibuprofen     Not an allergy. Pt stated she can't take it because of current use of ASA and Plavix    Past Medical History:  Diagnosis Date   CAD (coronary artery disease)    CKD (chronic kidney disease), stage II    COVID-19 01/2022   DM (diabetes mellitus) ([place])    HTN (hypertension)    Malignant hyperthermia    Myocardial infarction ([place])    Neuropathy      Past Surgical History:  Procedure Laterality Date   APPENDECTOMY     BACK SURGERY     CESAREAN SECTION     CORONARY ANGIOPLASTY WITH STENT PLACEMENT     MULTIPLE TOOTH EXTRACTIONS     ROTATOR CUFF REPAIR      Family History  Problem Relation Age of Onset   Heart disease Mother    Heart disease Sister     Social History   Tobacco Use   Smoking status: Former    Types: Cigarettes   Smokeless tobacco: Never  Vaping Use   Vaping Use: Never used  Substance Use Topics   Alcohol use: Not Currently   Drug use: Never    ROS   Objective:   Vitals: BP 108/71 (BP Location: Left Arm)   Pulse 86   Temp 98 F (36.7 C) (Oral)   Resp 16   SpO2 95%   Physical Exam Constitutional:      General: She is not in acute distress.    Appearance: Normal appearance. She is well-developed. She is obese. She is not ill-appearing, toxic-appearing or diaphoretic.  HENT:     Head: Normocephalic and atraumatic.     Nose: Nose normal.     Mouth/Throat:     Mouth: Mucous membranes are moist.  Eyes:     General: No scleral icterus.       Right eye: No discharge.        Left eye: No discharge.     Extraocular Movements: Extraocular movements intact.  Cardiovascular:     Rate and Rhythm: Normal rate.  Pulmonary:     Effort: Pulmonary effort is normal.  Musculoskeletal:     Lumbar back: No swelling, edema, deformity, signs of trauma, lacerations, spasms, tenderness or bony  tenderness. Decreased range of motion. Positive left straight leg raise test. Negative right straight leg raise test. No scoliosis.  Skin:    General: Skin is warm and dry.  Neurological:     General: No focal deficit present.     Mental Status: She is alert and oriented to person, place, and time.  Psychiatric:        Mood and Affect: Mood normal.  Behavior: Behavior normal.     Assessment and Plan :   I have reviewed the PDMP during this encounter.  1. Sciatica of left side   2. Type 2 diabetes mellitus treated with insulin ([place])     Unfortunately patient cannot undergo steroid course due to her severely uncontrolled diabetes.  Recommended using hydrocodone as this is consistent with her allergy medications/contraindications.  I am only providing 3 days worth of this medication.  Emphasized need for compliance with her PCP, close follow-up to assure that she is getting to a pain clinic. Counseled patient on potential for adverse effects with medications prescribed/recommended today, ER and return-to-clinic precautions discussed, patient verbalized understanding.    Jaynee Eagles, [place] 04/09/22 [id number]

## 2022-04-09 NOTE — ED Triage Notes (Signed)
Left leg/buttock pain that radiates into foot. Pulling sensation. Started yesterday while she was cleaning/bending/lifting. Reports hx of DM w/ neuropathy

## 2022-04-10 ENCOUNTER — Telehealth: Payer: Self-pay | Admitting: *Deleted

## 2022-04-10 NOTE — Telephone Encounter (Signed)
Called pt to follow up on her visit from yesterday, LMOM to advise pt she can call back if needed.

## 2022-04-14 ENCOUNTER — Encounter: Payer: Self-pay | Admitting: Physical Medicine and Rehabilitation

## 2022-04-15 ENCOUNTER — Telehealth: Payer: Self-pay

## 2022-04-15 NOTE — Telephone Encounter (Addendum)
Initiated Prior authorization [id number] G7 Sensor Via: Covermymeds Case/Key: [id number] Status: approved as of 04/15/22 Reason:Effective from 04/15/2022 through 04/14/2023.  Notified Pt via: Mychart

## 2022-04-20 ENCOUNTER — Telehealth: Payer: Self-pay | Admitting: Family Medicine

## 2022-04-20 NOTE — Telephone Encounter (Signed)
Patient called and states that she has not heard from her Endocrinology referral and would like an update if possible. Buel Ream

## 2022-04-21 ENCOUNTER — Telehealth (INDEPENDENT_AMBULATORY_CARE_PROVIDER_SITE_OTHER): Payer: BLUE CROSS/BLUE SHIELD | Admitting: Family Medicine

## 2022-04-21 ENCOUNTER — Telehealth: Payer: Self-pay | Admitting: Family Medicine

## 2022-04-21 ENCOUNTER — Telehealth: Payer: Self-pay

## 2022-04-21 ENCOUNTER — Encounter: Payer: Self-pay | Admitting: Family Medicine

## 2022-04-21 VITALS — BP 123/76 | Temp 97.2°F | Ht 69.0 in

## 2022-04-21 DIAGNOSIS — E1142 Type 2 diabetes mellitus with diabetic polyneuropathy: Secondary | ICD-10-CM

## 2022-04-21 DIAGNOSIS — M5416 Radiculopathy, lumbar region: Secondary | ICD-10-CM | POA: Diagnosis not present

## 2022-04-21 DIAGNOSIS — E1165 Type 2 diabetes mellitus with hyperglycemia: Secondary | ICD-10-CM

## 2022-04-21 DIAGNOSIS — Z7984 Long term (current) use of oral hypoglycemic drugs: Secondary | ICD-10-CM

## 2022-04-21 MED ORDER — PREGABALIN 200 MG PO CAPS: 200.0000 mg | ORAL_CAPSULE | Freq: Three times a day (TID) | ORAL | 1 refills | Status: DC

## 2022-04-21 NOTE — Telephone Encounter (Signed)
Need to check on DEXCom  PA if not received by 04/29/2022.

## 2022-04-21 NOTE — Progress Notes (Signed)
Established Patient Office Visit Virtual Visit via Video Note  I connected with Jean Calderon on 04/21/22 at  9:10 AM EST by a video enabled telemedicine application and verified that I am speaking with the correct person using two identifiers.  Location: Patient: at home in [facility] Provider: Primary care @ [facility] in [place]   I discussed the limitations of evaluation and management by telemedicine and the availability of in person appointments. The patient expressed understanding and agreed to proceed.  I discussed the assessment and treatment plan with the patient. The patient was provided an opportunity to ask questions and all were answered. The patient agreed with the plan and demonstrated an understanding of the instructions.   The patient was advised to call back or seek an in-person evaluation if the symptoms worsen or if the condition fails to improve as anticipated.  I provided 5 minutes of non-face-to-face time during this encounter.   Leeanne Rio, MD  Subjective   Patient ID: Jean Calderon, female    DOB: June 25, 1970  Age: 52 y.o. MRN: [id number]  Chief Complaint  Patient presents with   medication concerns    Lyrica -increased to taking 3 and now out of medication- needing refill showing increased amount Discuss current pain in legs and feet.  Appt with pain management March 23rd    Referral    Patient requesting a referral to new endocrinologist - not  happy with  [facility] - refuses to go back to Dr. Tamala Julian either - [facility] is requested by patient    HPI  Diabetes Pt has been referred to [facility] in [place] on 2/5. Appt pending. She was initially referred to [facility] but had long wait. She then was referred to [facility] but she no longer desires to see that provider. She was given phone number today for her to call and make appointment. She has neuropathy from uncontrolled diabetes. During last OV virtually done on 2/5, her  Lyrica was increased to 28m TID. She is out of medicines and needs this refilled.  Chronic back pain Pt with hx of lumbar radicular pain down both legs. She says her back feels like it's on fire. It hurts more to stand and walk. She has radiating pains down both legs. Has tried OTC pain patches and not helped. Also taking Tylenol, flexeril prn, along with Lyrica. She has been also referred to pain management and appt is in March.    Review of Systems  Musculoskeletal:  Positive for back pain.  Neurological:  Positive for tingling.  All other systems reviewed and are negative.     Objective:     BP 123/76 Comment: patient self reporting  Temp (!) 97.2 F (36.2 C) Comment: patient self reporting  Ht 5' 9"$  (1.753 m)   BMI 40.91 kg/m    Physical Exam Vitals and nursing note reviewed.  Constitutional:      Appearance: Normal appearance. She is obese.  HENT:     Head: Normocephalic and atraumatic.     Right Ear: External ear normal.     Left Ear: External ear normal.  Eyes:     Extraocular Movements: Extraocular movements intact.  Pulmonary:     Effort: Pulmonary effort is normal.  Neurological:     General: No focal deficit present.     Mental Status: She is alert and oriented to person, place, and time.  Psychiatric:        Mood and Affect: Mood  normal.        Behavior: Behavior normal.        Thought Content: Thought content normal.        Judgment: Judgment normal.     No results found for any visits on 04/21/22.    The 10-year ASCVD risk score (Arnett DK, et al., 2019) is: 12.9%    Assessment & Plan:   Lumbar radiculopathy -     DG Lumbar Spine Complete; Future -     Pregabalin; Take 1 capsule (200 mg total) by mouth 3 (three) times daily.  Dispense: 90 capsule; Refill: 1  Poorly controlled type 2 diabetes mellitus with peripheral neuropathy (HCC) -     Pregabalin; Take 1 capsule (200 mg total) by mouth 3 (three) times daily.  Dispense: 90 capsule;  Refill: 1   Will send in refills of Lyrica 26m TID. To call Endocrinology for appt. She has hx of lumbar radiculopathy worsening. Will send for updated imaging. Pain management appt pending in March.   No follow-ups on file.    ALeeanne Rio MD

## 2022-04-21 NOTE — Telephone Encounter (Signed)
Patient called stating where you  sent referral to they are scheduled out to May . She decided to go to another facility within [facility] the contact phone number is [phone] and fax [phone] . I sent a message to [facility] who handles the referrals in regards to this patient.

## 2022-04-22 NOTE — Telephone Encounter (Signed)
Endocrinology Referral has been faxed to both [facility] and [facility]. Both offices will have the MD review the Endocrinology referral to accept it and schedule then will contact patient to schedule an appointment. The office contact information is below;   [facility]      [address]      [id number]      [place] [address]      Ph: [facility]      [address],      [id number]      [place] [address]       Ph:[phone]

## 2022-04-26 NOTE — Telephone Encounter (Signed)
Referral, clinical notes, and copies of insurance cards have been faxed to [facility] at [phone]. Office will review referral prior to scheduling an appointment.

## 2022-04-27 ENCOUNTER — Telehealth: Payer: Self-pay | Admitting: Family Medicine

## 2022-04-27 NOTE — Telephone Encounter (Signed)
Patient called and states she needs more insulin, as she is "going through it like crazy". States she has about 50 units left of insulin and needs more. Requested a quantity of 3. Please advise. Buel Ream

## 2022-04-28 NOTE — Telephone Encounter (Signed)
Please see message below

## 2022-04-28 NOTE — Telephone Encounter (Signed)
Pt returned my call and the message below was given. She states that she uses a 200 mg/ml vial of insulin every other day. She states when her sugar is high it gives her more. And it is empty the next day. She says that she has not heard anything from Endo. I reminded her of  I verified contact information for referral and advised her to give [facility] a call to schedule an appointment. She expresses understanding and was able to take down information for scheduling.

## 2022-04-28 NOTE — Telephone Encounter (Signed)
Lvm for the pt to call the office back. 

## 2022-04-29 MED ORDER — INSULIN LISPRO 100 UNIT/ML IJ SOLN: 25.0000 [IU] | Freq: Three times a day (TID) | INTRAMUSCULAR | 1 refills | Status: DC

## 2022-04-29 NOTE — Telephone Encounter (Signed)
Refilled. Will discuss at next follow up appointment. Strongly encourage pt to follow up with Endocrinology.

## 2022-04-30 ENCOUNTER — Ambulatory Visit: Payer: Medicaid Other | Admitting: Family Medicine

## 2022-04-30 ENCOUNTER — Encounter: Payer: Self-pay | Admitting: Family Medicine

## 2022-05-05 ENCOUNTER — Ambulatory Visit: Payer: Medicaid Other | Admitting: Family Medicine

## 2022-05-06 ENCOUNTER — Other Ambulatory Visit: Payer: Self-pay | Admitting: Family Medicine

## 2022-05-06 ENCOUNTER — Ambulatory Visit (INDEPENDENT_AMBULATORY_CARE_PROVIDER_SITE_OTHER): Payer: BLUE CROSS/BLUE SHIELD | Admitting: Family Medicine

## 2022-05-06 ENCOUNTER — Encounter: Payer: Self-pay | Admitting: Family Medicine

## 2022-05-06 ENCOUNTER — Ambulatory Visit (INDEPENDENT_AMBULATORY_CARE_PROVIDER_SITE_OTHER): Payer: Medicaid Other

## 2022-05-06 ENCOUNTER — Ambulatory Visit: Payer: Medicaid Other | Admitting: Family Medicine

## 2022-05-06 VITALS — BP 107/73 | HR 80 | Temp 97.7°F | Ht 69.0 in | Wt 284.5 lb

## 2022-05-06 DIAGNOSIS — M5416 Radiculopathy, lumbar region: Secondary | ICD-10-CM

## 2022-05-06 DIAGNOSIS — E1165 Type 2 diabetes mellitus with hyperglycemia: Secondary | ICD-10-CM | POA: Diagnosis not present

## 2022-05-06 DIAGNOSIS — W19XXXA Unspecified fall, initial encounter: Secondary | ICD-10-CM

## 2022-05-06 DIAGNOSIS — R079 Chest pain, unspecified: Secondary | ICD-10-CM

## 2022-05-06 DIAGNOSIS — E1142 Type 2 diabetes mellitus with diabetic polyneuropathy: Secondary | ICD-10-CM

## 2022-05-06 DIAGNOSIS — J301 Allergic rhinitis due to pollen: Secondary | ICD-10-CM

## 2022-05-06 LAB — POCT GLYCOSYLATED HEMOGLOBIN (HGB A1C): Hemoglobin A1C: 14 % — AB (ref 4.0–5.6)

## 2022-05-06 MED ORDER — FEXOFENADINE HCL 180 MG PO TABS: 180.0000 mg | ORAL_TABLET | Freq: Every day | ORAL | 3 refills | Status: DC

## 2022-05-06 NOTE — Progress Notes (Signed)
Established Patient Office Visit  Subjective   Patient ID: Jean Calderon, female    DOB: [birth year]  Age: 52 y.o. MRN: [id number]  Chief Complaint  Patient presents with   Diabetes    Still has not been seen in Endo.    Breast Pain    Left side; started 2 days ago. She states that is constant.     Diabetes   Rib pain In the left breast and in her back. Started yesterday. Pt has been resting and laying down. She reports it also hurts to breath. She has had a mammogram in the last year in August. Pt does report a fall a week ago. Her legs gave out and think she fell something on her left side.  Diabetes  Pt has insulin pump. She is unsure the settings. She says it's set based on her previous provider. She is awaiting her dexcom 7 that has just been approved. She goes to pain management the end of this month and has worsening neuropathy. Pt says her sugars are always high. She is awaiting appt to get in with Endocrinology.   Sinus pain Pt reports worsening sinus pain. Has hx of allergic rhinitis. Using her flonase. Out of her Allegra that she says worked very well. Needs this refilled.   Review of Systems  HENT:  Positive for sinus pain.   Musculoskeletal:  Positive for back pain.       Left sided Rib pain  Neurological:  Positive for tingling.  All other systems reviewed and are negative.     Objective:     BP 107/73   Pulse 80   Temp 97.7 F (36.5 C) (Oral)   Ht '5\' 9"'$  (1.753 m)   Wt 284 lb 8 oz (129 kg)   SpO2 95%   BMI 42.01 kg/m    Physical Exam Vitals and nursing note reviewed.  Constitutional:      Appearance: Normal appearance. She is obese.  HENT:     Head: Normocephalic and atraumatic.     Right Ear: External ear normal.     Left Ear: External ear normal.     Nose: Nose normal.     Mouth/Throat:     Mouth: Mucous membranes are moist.     Pharynx: Oropharynx is clear.  Eyes:     Pupils: Pupils are equal, round, and reactive to light.   Cardiovascular:     Rate and Rhythm: Normal rate and regular rhythm.     Pulses: Normal pulses.     Heart sounds: Normal heart sounds.  Abdominal:     General: Abdomen is flat. Bowel sounds are normal.  Skin:    General: Skin is warm.     Capillary Refill: Capillary refill takes less than 2 seconds.  Neurological:     General: No focal deficit present.     Mental Status: She is alert and oriented to person, place, and time. Mental status is at baseline.  Psychiatric:        Mood and Affect: Mood normal.        Behavior: Behavior normal.        Thought Content: Thought content normal.        Judgment: Judgment normal.    No results found for any visits on 05/06/22.    The 10-year ASCVD risk score (Arnett DK, et al., 2019) is: 8.1%    Assessment & Plan:   Problem List Items Addressed This Visit    Poorly controlled type 2  diabetes mellitus with peripheral neuropathy (HCC) -     POCT glycosylated hemoglobin (Hb A1C) Lab Results  Component Value Date   HGBA1C 14.0 (A) 05/06/2022   Pt was advised to let me know insulin pump settings so I can adjust. Referral was resent to Endo and she was instructed to call them for an appointment asap.   Left-sided chest pain -     DG Ribs Unilateral Left; Future  Pt reports a fall and after that, now has left sided pain. Will send for imaging to rule rib fracture.  Fall, initial encounter -     DG Ribs Unilateral Left; Future  Seasonal allergic rhinitis due to pollen -     Fexofenadine HCl; Take 1 tablet (180 mg total) by mouth daily.  Dispense: 90 tablet; Refill: 3    -pt to continue flonase and will refill her Allegra.   No follow-ups on file.    Leeanne Rio, MD

## 2022-05-10 ENCOUNTER — Telehealth: Payer: Self-pay | Admitting: Family Medicine

## 2022-05-10 NOTE — Telephone Encounter (Signed)
I gave pt imaging message per Dr. Roland Rack result message. Pt does not understand what this means for the pressure in her back. I explained that she was referred to pain management to further address. She says that she will be calling to get a neurology appointment. She did not sound happy with results. Feedback was not provided on images sent.

## 2022-05-10 NOTE — Telephone Encounter (Signed)
As far as the images that was sent on mychart of her insulin pump settings, I explained to pt that I never had these messages forwarded to me. I actually just pulled them myself from her chart and have responded to her regarding her insulin pump images.  She states she sent them on Friday and I explained to her that we close at 12 noon. She voiced understanding and was appreciative for my call and response.

## 2022-05-10 NOTE — Telephone Encounter (Signed)
Called patient personally and relayed the results of her imaging. She was pleased that I returned her call. She stated she didn't understand the result that was relayed to her and asked several questions about the xray.

## 2022-05-10 NOTE — Telephone Encounter (Signed)
Patient called and is requesting a call back. Wants to hear results of imaging and feedback on the images of the insulin pump she sent. Requesting call at number on file. Jean Calderon

## 2022-05-13 ENCOUNTER — Encounter: Payer: Self-pay | Admitting: Podiatry

## 2022-05-13 ENCOUNTER — Ambulatory Visit (INDEPENDENT_AMBULATORY_CARE_PROVIDER_SITE_OTHER): Payer: BLUE CROSS/BLUE SHIELD | Admitting: Podiatry

## 2022-05-13 DIAGNOSIS — M79671 Pain in right foot: Secondary | ICD-10-CM

## 2022-05-13 DIAGNOSIS — M79675 Pain in left toe(s): Secondary | ICD-10-CM

## 2022-05-13 DIAGNOSIS — E1149 Type 2 diabetes mellitus with other diabetic neurological complication: Secondary | ICD-10-CM | POA: Diagnosis not present

## 2022-05-13 DIAGNOSIS — M79674 Pain in right toe(s): Secondary | ICD-10-CM

## 2022-05-13 DIAGNOSIS — M79672 Pain in left foot: Secondary | ICD-10-CM

## 2022-05-13 DIAGNOSIS — G629 Polyneuropathy, unspecified: Secondary | ICD-10-CM | POA: Diagnosis not present

## 2022-05-13 DIAGNOSIS — B351 Tinea unguium: Secondary | ICD-10-CM

## 2022-05-13 NOTE — Progress Notes (Signed)
  Subjective:  Patient ID: Jean Calderon, female    DOB: 13-Jun-1970,   MRN: [id number]  Chief Complaint  Patient presents with   Foot Pain    Bilateral foot pain, patient is a diabetic    Nail Problem     Diabetic foot care    52 y.o. female presents for concern of thickened elongated and painful nails that are difficult to trim. Requesting to have them trimmed today. Relates burning and tingling in their feet. Patient is diabetic and last A1c was  Lab Results  Component Value Date   HGBA1C 14.0 (A) 05/06/2022   .   PCP:  Leeanne Rio, MD    . Denies any other pedal complaints. Denies n/v/f/c.   Past Medical History:  Diagnosis Date   CAD (coronary artery disease)    Cervical radiculopathy 08/15/2017   CKD (chronic kidney disease), stage II    COVID-19 01/2022   DM (diabetes mellitus) ([place])    HTN (hypertension)    Lumbar radiculopathy 06/21/2019   Last Assessment & Plan: Formatting of this note might be different from the original. Scheduled for laminectomy L5 4/27 Pre-op complete at hospital; labs and ekg reviewed. Scheduled for cardiology eval in one day Medical cleared for procedure by IM evaluation.   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 Severe tendinosis of the supraspinatus tendon.  2. Mild tendinosis of the infraspinatus tendon.  3. Thickening of the inferior joint capsule and intermediate signal  material effacing the normal subcoracoid fat as can be seen with  adhesive capsulitis.    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    Objective:  Physical Exam: Vascular: DP/PT pulses 2/4 bilateral. CFT <3 seconds. Absent hair growth on digits. Edema noted to bilateral lower extremities. Xerosis noted bilaterally.  Skin. No lacerations or abrasions bilateral feet. Nails 1-5 bilateral  are thickened  discolored and elongated with subungual debris.  Musculoskeletal: MMT 5/5 bilateral lower extremities in DF, PF, Inversion and Eversion. Deceased ROM in DF of ankle joint.  Neurological: Sensation intact to light touch. Protective sensation diminished bilateral.    Assessment:   1. Dermatophytosis of nail   2. Pain in both feet   3. Pain in toes of both feet   4. Type II diabetes mellitus with neurological manifestations ([place])   5. Neuropathy      Plan:  Patient was evaluated and treated and all questions answered. -Discussed and educated patient on diabetic foot care, especially with  regards to the vascular, neurological and musculoskeletal systems.  -Stressed the importance of good glycemic control and the detriment of not  controlling glucose levels in relation to the foot. -Discussed supportive shoes at all times and checking feet regularly.  -Mechanically debrided all nails 1-5 bilateral using sterile nail nipper and filed with dremel without incident  -Patient will be following up with neurologist soon and will be looking into spinal nerve stimulator.  -Answered all patient questions -Patient to return  in 3 months for at risk foot care -Patient advised to call the office if any problems or questions arise in the meantime.   Lorenda Peck, DPM

## 2022-05-17 ENCOUNTER — Ambulatory Visit
Admission: EM | Admit: 2022-05-17 | Discharge: 2022-05-17 | Disposition: A | Discharge status: Home / Self Care | Payer: BLUE CROSS/BLUE SHIELD | Attending: Family Medicine | Admitting: Family Medicine

## 2022-05-17 DIAGNOSIS — H109 Unspecified conjunctivitis: Secondary | ICD-10-CM

## 2022-05-17 MED ORDER — MOXIFLOXACIN HCL 0.5 % OP SOLN: 1.0000 [drp] | Freq: Three times a day (TID) | OPHTHALMIC | 0 refills | Status: AC

## 2022-05-17 NOTE — Discharge Instructions (Addendum)
Instructed patient to instill eyedrops as directed.  Advised patients if symptoms worsen and/or unresolved please follow-up with your optometrist or ophthalmologist for further evaluation.

## 2022-05-17 NOTE — ED Triage Notes (Signed)
Pt presents with left eye pain, drainage, and swelling that began this morning

## 2022-05-17 NOTE — ED Provider Notes (Signed)
[name]    CSN: [id number] Arrival date & time: 05/17/22  1837      History   Chief Complaint Chief Complaint  Patient presents with   Conjunctivitis    HPI Jean Calderon is a 52 y.o. female.   HPI 52 year old female presents with left eye pain, drainage and swelling that began this morning.  PMH significant for CHF, HTN, and CKD stage II.  Past Medical History:  Diagnosis Date   CAD (coronary artery disease)    Cervical radiculopathy 08/15/2017   CKD (chronic kidney disease), stage II    COVID-19 01/2022   DM (diabetes mellitus) ([place])    HTN (hypertension)    Lumbar radiculopathy 06/21/2019   Last Assessment & Plan: Formatting of this note might be different from the original. Scheduled for laminectomy L5 4/27 Pre-op complete at hospital; labs and ekg reviewed. Scheduled for cardiology eval in one day Medical cleared for procedure by IM evaluation.   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 Severe tendinosis of the supraspinatus tendon.  2. Mild tendinosis of the infraspinatus tendon.  3. Thickening of the inferior joint capsule and intermediate signal  material effacing the normal subcoracoid fat as can be seen with  adhesive capsulitis.    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    Patient Active Problem List   Diagnosis Date Noted   Ulnar neuropathy of both upper extremities 12/23/2021   Hyperlipidemia 10/01/2021   Diabetic neuropathy ([place]) 09/30/2021   Meralgia paresthetica, right lower limb 09/30/2021   Gait instability 07/30/2021   Migraine without status migrainosus, not intractable 07/30/2021   Poorly controlled type 2 diabetes mellitus with peripheral neuropathy ([place]) 06/05/2021   Menopausal symptoms 04/15/2021   Anxiety 03/25/2021   CAD (coronary artery disease)    HTN  (hypertension)    CKD (chronic kidney disease), stage II    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   Heart valve disease 01/06/2021   Vitamin B12 deficiency 12/03/2020   Vitamin D deficiency 12/03/2020   Chronic left shoulder pain 11/25/2020   Generalized anxiety disorder 11/25/2020   Psoriasis 01/01/2020   Lumbar radiculopathy 06/21/2019   Uterine leiomyoma 03/06/2019   Depression with anxiety 02/02/2018   Primary insomnia 02/02/2018   Class 2 severe obesity due to excess calories with serious comorbidity and body mass index (BMI) of 38.0 to 38.9 in adult ([place]) 10/12/2017   Insulin pump in place 10/12/2017   Cervical radiculopathy 08/15/2017   Family history of early CAD 07/21/2017   Obstructive sleep apnea syndrome 07/21/2017   Chronic right shoulder pain 05/30/2017   Type 2 diabetes mellitus with mild nonproliferative diabetic retinopathy without macular edema, bilateral ([place]) 03/18/2017   Congestive heart failure ([place]) 03/16/2017   Other cervical disc degeneration, unspecified cervical region 11/11/2016   Adhesive capsulitis of right shoulder 10/08/2016   GERD (gastroesophageal reflux disease) 07/19/2016   Spinal stenosis of cervical region 12/01/2015   Cervical disc herniation 12/01/2015   Bilateral carpal tunnel syndrome 11/04/2015   Lesion of ulnar nerve, left upper limb 11/04/2015   Stented coronary artery 07/07/2015    Past Surgical History:  Procedure Laterality Date   APPENDECTOMY     BACK SURGERY     CESAREAN SECTION     CORONARY ANGIOPLASTY WITH STENT PLACEMENT     MULTIPLE TOOTH EXTRACTIONS  ROTATOR CUFF REPAIR      OB History   No obstetric history on file.      Home Medications    Prior to Admission medications   Medication Sig Start Date End Date Taking? Authorizing Provider  moxifloxacin (VIGAMOX) 0.5 % ophthalmic solution Place 1 drop into both eyes 3 (three) times daily for 5 days. 05/17/22 05/22/22 Yes Eliezer Lofts, FNP  acetaminophen  (TYLENOL) 325 MG tablet Take 2 tablets (650 mg total) by mouth every 6 (six) hours as needed for moderate pain. 04/09/22   Jaynee Eagles, PA-C  albuterol (VENTOLIN HFA) 108 (90 Base) MCG/ACT inhaler Inhale 2 puffs into the lungs every 4 (four) hours as needed for shortness of breath or wheezing. 01/22/22   Kandra Nicolas, MD  aspirin EC 81 MG tablet Take 81 mg by mouth daily.    [provider]  atorvastatin (LIPITOR) 40 MG tablet Take 1 tablet (40 mg total) by mouth daily. 03/09/22   Leeanne Rio, MD  cloNIDine (CATAPRES) 0.1 MG tablet Take 0.1 mg by mouth 2 (two) times daily. 01/08/21   [provider]  clopidogrel (PLAVIX) 75 MG tablet Take 75 mg by mouth daily. 11/25/20   [provider]  Continuous Blood Gluc Receiver (DEXCOM G7 RECEIVER) DEVI Check sugars daily Dx E11.9 04/05/22   Leeanne Rio, MD  Continuous Blood Gluc Sensor (DEXCOM G7 SENSOR) MISC Check sugars daily Dx E11.9 04/05/22   Leeanne Rio, MD  Crisaborole (EUCRISA) 2 % OINT Apply 1 Application topically 2 (two) times daily. Patient not taking: Reported on 05/06/2022 06/11/20   [provider]  cyclobenzaprine (FLEXERIL) 10 MG tablet TAKE 1 TABLET(10 MG) BY MOUTH THREE TIMES DAILY AS NEEDED FOR MUSCLE SPASMS 04/12/22   Leeanne Rio, MD  Estradiol-Norethindrone Acet 0.5-0.1 MG tablet Take 1 tablet by mouth daily. 12/18/20   [provider]  fexofenadine (ALLEGRA) 180 MG tablet Take 1 tablet (180 mg total) by mouth daily. 05/06/22   Rucker, Nicole Kindred, MD  fluticasone (FLONASE) 50 MCG/ACT nasal spray SHAKE LIQUID AND USE 2 SPRAYS IN EACH NOSTRIL DAILY 03/02/22   Rucker, Nicole Kindred, MD  HUMALOG 100 UNIT/ML injection INJECT 25 UNITS INTO THE SKIN 3 TIMES DAILY BEFORE MEALS. USE INSULIN PUMP 05/12/22   Leeanne Rio, MD  hydrOXYzine (ATARAX) 25 MG tablet Take 25 mg by mouth 3 (three) times daily. 04/09/22   [provider]  Insulin Disposable Pump (OMNIPOD 5 G6 POD, GEN 5,) MISC  SMARTSIG:SUB-Q Every Other Day 12/02/20   [provider]  isosorbide mononitrate (IMDUR) 30 MG 24 hr tablet Take 1 tablet (30 mg total) by mouth daily. 02/04/22   Leeanne Rio, MD  lisinopril-hydrochlorothiazide (ZESTORETIC) 20-12.5 MG tablet Take 1 tablet by mouth daily. 12/04/20   [provider]  metoprolol succinate (TOPROL-XL) 100 MG 24 hr tablet Take 100 mg by mouth daily. 12/04/20   [provider]  montelukast (SINGULAIR) 10 MG tablet Take 1 tablet (10 mg total) by mouth at bedtime. Patient not taking: Reported on 05/06/2022 02/04/22   Leeanne Rio, MD  nitroGLYCERIN (NITROSTAT) 0.4 MG SL tablet Place 0.4 mg under the tongue every 5 (five) minutes as needed for chest pain. 01/05/21   [provider]  pregabalin (LYRICA) 200 MG capsule Take 1 capsule (200 mg total) by mouth 3 (three) times daily. 04/21/22   Rucker, Nicole Kindred, MD  SKYRIZI PEN 150 MG/ML SOAJ Inject 1 mL into the skin every 3 (three)  months. 02/24/21   [provider]  topiramate (TOPAMAX) 100 MG tablet Take by mouth 2 (two) times daily. Patient not taking: Reported on 05/06/2022 08/17/21   [provider]  traZODone (DESYREL) 50 MG tablet Take 0.5-1 tablets (25-50 mg total) by mouth at bedtime as needed for sleep. 02/04/22   Leeanne Rio, MD  venlafaxine XR (EFFEXOR XR) 37.5 MG 24 hr capsule Take 1 capsule (37.5 mg total) by mouth daily with breakfast. 02/04/22   Leeanne Rio, MD    Family History Family History  Problem Relation Age of Onset   Heart disease Mother    Heart disease Sister     Social History Social History   Tobacco Use   Smoking status: Former    Types: Cigarettes   Smokeless tobacco: Never  Vaping Use   Vaping Use: Never used  Substance Use Topics   Alcohol use: Not Currently   Drug use: Never     Allergies   Tramadol, Nsaids, Sulfamethoxazole-trimethoprim, and Ibuprofen   Review of Systems Review of Systems   Physical  Exam Triage Vital Signs ED Triage Vitals  Enc Vitals Group     BP 05/17/22 1905 102/66     Pulse Rate 05/17/22 1905 91     Resp 05/17/22 1905 14     Temp 05/17/22 1905 98.6 F (37 C)     Temp Source 05/17/22 1905 Oral     SpO2 05/17/22 1905 93 %     Weight --      Height --      Head Circumference --      Peak Flow --      Pain Score 05/17/22 1904 10     Pain Loc --      Pain Edu? --      Excl. in [place]? --    No data found.  Updated Vital Signs BP 102/66 (BP Location: Right Arm)   Pulse 91   Temp 98.6 F (37 C) (Oral)   Resp 14   SpO2 93%   Visual Acuity Right Eye Distance:   Left Eye Distance:   Bilateral Distance:    Right Eye Near:   Left Eye Near:    Bilateral Near:     Physical Exam Vitals and nursing note reviewed.  Constitutional:      Appearance: Normal appearance. She is normal weight.  HENT:     Head: Normocephalic and atraumatic.     Mouth/Throat:     Mouth: Mucous membranes are moist.     Pharynx: Oropharynx is clear.  Eyes:     Extraocular Movements: Extraocular movements intact.     Conjunctiva/sclera: Conjunctivae normal.     Pupils: Pupils are equal, round, and reactive to light.     Comments: Sclera injected +3 bilaterally mucopurulent discharge noted at inner canthus bilaterally.  Cardiovascular:     Rate and Rhythm: Normal rate and regular rhythm.     Pulses: Normal pulses.     Heart sounds: Normal heart sounds.  Pulmonary:     Effort: Pulmonary effort is normal.     Breath sounds: Normal breath sounds. No wheezing, rhonchi or rales.  Musculoskeletal:        General: Normal range of motion.     Cervical back: Normal range of motion and neck supple. No tenderness.  Lymphadenopathy:     Cervical: No cervical adenopathy.  Skin:    General: Skin is warm and dry.  Neurological:     General: No focal  deficit present.     Mental Status: She is alert and oriented to person, place, and time. Mental status is at baseline.      UC  Treatments / Results  Labs (all labs ordered are listed, but only abnormal results are displayed) Labs Reviewed - No data to display  EKG   Radiology No results found.  Procedures Procedures (including critical care time)  Medications Ordered in UC Medications - No data to display  Initial Impression / Assessment and Plan / UC Course  I have reviewed the triage vital signs and the nursing notes.  Pertinent labs & imaging results that were available during my care of the patient were reviewed by me and considered in my medical decision making (see chart for details).     MDM: 1.  Conjunctivitis of both eyes, unspecified conjunctivitis type-Rx'd Vigamox. Instructed patient to instill eyedrops as directed.  Advised patients if symptoms worsen and/or unresolved please follow-up with your optometrist or ophthalmologist for further evaluation.  Patient discharged home, hemodynamically stable.  Final Clinical Impressions(s) / UC Diagnoses   Final diagnoses:  Conjunctivitis of both eyes, unspecified conjunctivitis type     Discharge Instructions      Instructed patient to instill eyedrops as directed.  Advised patients if symptoms worsen and/or unresolved please follow-up with your optometrist or ophthalmologist for further evaluation.     ED Prescriptions     Medication Sig Dispense Auth. Provider   moxifloxacin (VIGAMOX) 0.5 % ophthalmic solution Place 1 drop into both eyes 3 (three) times daily for 5 days. 3 mL Eliezer Lofts, FNP      PDMP not reviewed this encounter.   Eliezer Lofts, [place] 05/17/22 1931

## 2022-05-18 ENCOUNTER — Ambulatory Visit (HOSPITAL_COMMUNITY): Payer: Medicaid Other | Admitting: Psychiatry

## 2022-05-18 ENCOUNTER — Encounter (HOSPITAL_COMMUNITY): Payer: Self-pay

## 2022-05-19 ENCOUNTER — Encounter: Payer: Medicaid Other | Admitting: Physical Medicine and Rehabilitation

## 2022-05-20 ENCOUNTER — Ambulatory Visit: Payer: BLUE CROSS/BLUE SHIELD | Admitting: Podiatry

## 2022-05-21 ENCOUNTER — Ambulatory Visit: Payer: Commercial Managed Care - HMO | Admitting: Podiatry

## 2022-05-24 ENCOUNTER — Ambulatory Visit (INDEPENDENT_AMBULATORY_CARE_PROVIDER_SITE_OTHER): Payer: BLUE CROSS/BLUE SHIELD | Admitting: Family Medicine

## 2022-05-24 ENCOUNTER — Encounter: Payer: Self-pay | Admitting: Family Medicine

## 2022-05-24 ENCOUNTER — Telehealth: Payer: Self-pay | Admitting: Family Medicine

## 2022-05-24 VITALS — BP 101/69 | HR 77 | Temp 97.8°F | Resp 18 | Ht 69.0 in | Wt 280.2 lb

## 2022-05-24 DIAGNOSIS — I2510 Atherosclerotic heart disease of native coronary artery without angina pectoris: Secondary | ICD-10-CM | POA: Diagnosis not present

## 2022-05-24 DIAGNOSIS — I509 Heart failure, unspecified: Secondary | ICD-10-CM | POA: Diagnosis not present

## 2022-05-24 DIAGNOSIS — H1045 Other chronic allergic conjunctivitis: Secondary | ICD-10-CM | POA: Diagnosis not present

## 2022-05-24 DIAGNOSIS — J301 Allergic rhinitis due to pollen: Secondary | ICD-10-CM | POA: Diagnosis not present

## 2022-05-24 MED ORDER — CLOPIDOGREL BISULFATE 75 MG PO TABS: 75.0000 mg | ORAL_TABLET | Freq: Every day | ORAL | 1 refills | Status: DC

## 2022-05-24 MED ORDER — MOMETASONE FUROATE 50 MCG/ACT NA SUSP: NASAL | 6 refills | Status: AC

## 2022-05-24 MED ORDER — OLOPATADINE HCL 0.1 % OP SOLN: 1.0000 [drp] | Freq: Two times a day (BID) | OPHTHALMIC | 3 refills | Status: AC

## 2022-05-24 NOTE — Telephone Encounter (Signed)
Patient called and states that her heart medication was declined either by the provider or pharmacy. She states she does not know which but that she has been out for several days and needs it resolved ASAP. States that pharmacy has sent faxes to this location. Please advise. Buel Ream

## 2022-05-24 NOTE — Progress Notes (Signed)
Acute Office Visit  Subjective:     Patient ID: Jean Calderon, female    DOB: [birth year], 52 y.o.   MRN: [id number]  Chief Complaint  Patient presents with   Follow-up    Patient is here she states that she wants to discuss anxiety and on going issues    HPI Patient is in today for acute visit  Pt reports she has a couple of things. She reports she has missed a few appointments last week with mental health, pain management, and podiatry. She reports she has rescheduled all of these appointments. She has endocrinologist appt for the type 1 diabetes and insulin pump management on 06/08/22.  She reports she went to urgent care for conjunctivitis. She was given eye drops and has completed. She feels like they are better. No sensitivity to the sun. No pruritus. She has had nasal congestion and headaches. She ha hx of allergic rhinitis. Using Singulair and Flonase. Having ear fullness also.  Pt also requests me to refill Plavix. She has hx of CAD and CHF. She use to see cardiologist with [facility] but would like to switch to [facility] provider.   Review of Systems  HENT:  Positive for congestion, ear pain and sinus pain.   Psychiatric/Behavioral:  The patient is nervous/anxious.   All other systems reviewed and are negative.      Objective:    BP 101/69   Pulse 77   Temp 97.8 F (36.6 C) (Oral)   Resp 18   Ht 5\' 9"  (1.753 m)   Wt 280 lb 3.2 oz (127.1 kg)   SpO2 98%   BMI 41.38 kg/m    Physical Exam Vitals and nursing note reviewed.  Constitutional:      Appearance: Normal appearance. She is obese.  HENT:     Head: Normocephalic and atraumatic.     Right Ear: Tympanic membrane, ear canal and external ear normal.     Left Ear: Tympanic membrane, ear canal and external ear normal.     Nose: Nose normal.     Mouth/Throat:     Mouth: Mucous membranes are moist.  Eyes:     Pupils: Pupils are equal, round, and reactive to light.  Cardiovascular:     Rate and Rhythm: Normal rate.      Pulses: Normal pulses.  Pulmonary:     Effort: Pulmonary effort is normal.  Skin:    Capillary Refill: Capillary refill takes less than 2 seconds.  Neurological:     General: No focal deficit present.     Mental Status: She is alert and oriented to person, place, and time. Mental status is at baseline.  Psychiatric:        Mood and Affect: Mood normal.        Behavior: Behavior normal.        Thought Content: Thought content normal.        Judgment: Judgment normal.   No results found for any visits on 05/24/22.      Assessment & Plan:   Problem List Items Addressed This Visit   None   No orders of the defined types were placed in this encounter. Other chronic allergic conjunctivitis of both eyes  Seasonal allergic rhinitis due to pollen -     Olopatadine HCl; Place 1 drop into both eyes 2 (two) times daily.  Dispense: 5 mL; Refill: 3 -     Mometasone Furoate; One spray in each nostril twice a day, use left hand for right  nostril, and right hand for left nostril.  Please dispense one bottle.  Dispense: 1 g; Refill: 6  Congestive heart failure, unspecified HF chronicity, unspecified heart failure type ([place]) -     Clopidogrel Bisulfate; Take 1 tablet (75 mg total) by mouth daily.  Dispense: 90 tablet; Refill: 1 -     Ambulatory referral to Cardiology  Coronary artery disease involving native heart, unspecified vessel or lesion type, unspecified whether angina present -     Clopidogrel Bisulfate; Take 1 tablet (75 mg total) by mouth daily.  Dispense: 90 tablet; Refill: 1 -     Ambulatory referral to Cardiology   Will send in pataday eye drops for allergic conjunctivitis along with switching from Flonase to Nasonex for allergic rhinitis. If symptoms persists despite treatment, may warrant allergist referral Refilled Plavix today and referral to [facility] placed. Pt advised to keep appt with behavioral health for continued management of anxiety  No follow-ups on  file.  Leeanne Rio, MD

## 2022-05-24 NOTE — Telephone Encounter (Signed)
Plavix 75mg?? 

## 2022-05-24 NOTE — Telephone Encounter (Signed)
I would hazard a guess that may be it. I requested a spelling she could not provide when I tried to clarify with her. I went off what I could hear and the dosage provided. Patient has scheduled an appointment for later this afternoon to go over some of her medicines as well if that is of any help. Buel Ream

## 2022-05-24 NOTE — Telephone Encounter (Signed)
Noted and aware thank you. Will address with her during office visit.

## 2022-05-24 NOTE — Telephone Encounter (Signed)
A lot of her medicines are still stating historical so it may have been sent to her previous provider and they have possibly declined?

## 2022-05-24 NOTE — Telephone Encounter (Signed)
Patient states it is the generic eplasix 75mg  over the phone, for heart medication. Jean Calderon

## 2022-06-03 ENCOUNTER — Other Ambulatory Visit: Payer: Self-pay | Admitting: Family Medicine

## 2022-06-06 DIAGNOSIS — N1832 Chronic kidney disease, stage 3b: Secondary | ICD-10-CM | POA: Insufficient documentation

## 2022-06-06 DIAGNOSIS — K859 Acute pancreatitis without necrosis or infection, unspecified: Secondary | ICD-10-CM | POA: Insufficient documentation

## 2022-06-06 DIAGNOSIS — R7401 Elevation of levels of liver transaminase levels: Secondary | ICD-10-CM | POA: Insufficient documentation

## 2022-06-07 ENCOUNTER — Telehealth (INDEPENDENT_AMBULATORY_CARE_PROVIDER_SITE_OTHER): Payer: BLUE CROSS/BLUE SHIELD | Admitting: Family Medicine

## 2022-06-07 ENCOUNTER — Encounter: Payer: Self-pay | Admitting: Family Medicine

## 2022-06-07 VITALS — BP 114/57 | Ht 69.0 in | Wt 275.0 lb

## 2022-06-07 DIAGNOSIS — E1165 Type 2 diabetes mellitus with hyperglycemia: Secondary | ICD-10-CM | POA: Diagnosis not present

## 2022-06-07 DIAGNOSIS — E1142 Type 2 diabetes mellitus with diabetic polyneuropathy: Secondary | ICD-10-CM | POA: Diagnosis not present

## 2022-06-07 NOTE — Progress Notes (Signed)
Virtual Visit via Video Note  I connected with Helmut Muster on 06/07/22 at  1:10 PM EDT by a video enabled telemedicine application and verified that I am speaking with the correct person using two identifiers.  Location: Patient: [facility] [place] Provider: Primary Care at [facility]   I discussed the limitations of evaluation and management by telemedicine and the availability of in person appointments. The patient expressed understanding and agreed to proceed.  History of Present Illness:  Pt initially made this appointment in order to obtain an 'ADA' for her diabetes. She just started a new job and needs this for her diabetes and to be excused. She reports she has to be on her job for a year in order to obtain FMLA.  Pt reports she went to hospital on Saturday for abdominal pain. Was admitted for acute pancreatitis.  Follow Up Instructions: Have discussed with pt that she is to follow up with me once she is discharged from the hospital. She is also to drop off any necessary paperwork for her ADA for me to review and complete.   I discussed the assessment and treatment plan with the patient. The patient was provided an opportunity to ask questions and all were answered. The patient agreed with the plan and demonstrated an understanding of the instructions.   The patient was advised to call back or seek an in-person evaluation if the symptoms worsen or if the condition fails to improve as anticipated.  I provided 5 minutes of non-face-to-face time during this encounter.   Suzan Slick, MD

## 2022-06-11 ENCOUNTER — Other Ambulatory Visit: Payer: Self-pay | Admitting: Family Medicine

## 2022-06-11 DIAGNOSIS — J301 Allergic rhinitis due to pollen: Secondary | ICD-10-CM

## 2022-06-14 ENCOUNTER — Telehealth: Payer: Self-pay | Admitting: Family Medicine

## 2022-06-14 DIAGNOSIS — E1142 Type 2 diabetes mellitus with diabetic polyneuropathy: Secondary | ICD-10-CM

## 2022-06-14 DIAGNOSIS — F418 Other specified anxiety disorders: Secondary | ICD-10-CM

## 2022-06-14 MED ORDER — TRAZODONE HCL 50 MG PO TABS
25.0000 mg | ORAL_TABLET | Freq: Every evening | ORAL | 3 refills | Status: DC | PRN
Start: 2022-06-14 — End: 2022-07-01

## 2022-06-14 MED ORDER — HYDROXYZINE HCL 25 MG PO TABS
25.0000 mg | ORAL_TABLET | Freq: Three times a day (TID) | ORAL | 1 refills | Status: DC | PRN
Start: 2022-06-14 — End: 2022-10-18

## 2022-06-14 MED ORDER — ATORVASTATIN CALCIUM 40 MG PO TABS: 40.0000 mg | ORAL_TABLET | Freq: Every day | ORAL | 1 refills | Status: DC

## 2022-06-14 NOTE — Telephone Encounter (Signed)
Patient called and is requesting an update on the prior authorization for her PODS (did not specify which kind or what for) but that she only had a box left and she needs them ASAP. Please advise. Katha Hamming

## 2022-06-14 NOTE — Telephone Encounter (Signed)
Patient called to request medication refills for atorvastatin, hydrozine, trazadone, and jardiance. She states she is going to be out of these in a few days, if not today and needs refills ASAP. Please advise. Jean Calderon

## 2022-06-14 NOTE — Telephone Encounter (Signed)
Refilled Atorvastatin , Hydroxyzine and Trazodone. Pt reported no longer taking Jardiance in Dec 2023

## 2022-06-15 ENCOUNTER — Ambulatory Visit (HOSPITAL_COMMUNITY)
Admission: EM | Admit: 2022-06-15 | Discharge: 2022-06-15 | Disposition: A | Discharge status: Home / Self Care | Payer: BLUE CROSS/BLUE SHIELD | Attending: Nurse Practitioner | Admitting: Nurse Practitioner

## 2022-06-15 ENCOUNTER — Other Ambulatory Visit: Payer: Self-pay | Admitting: Family Medicine

## 2022-06-15 DIAGNOSIS — F411 Generalized anxiety disorder: Secondary | ICD-10-CM

## 2022-06-15 DIAGNOSIS — F4321 Adjustment disorder with depressed mood: Secondary | ICD-10-CM | POA: Diagnosis not present

## 2022-06-15 LAB — GLUCOSE, CAPILLARY: Glucose-Capillary: 209 mg/dL — ABNORMAL HIGH (ref 70–99)

## 2022-06-15 MED ORDER — JARDIANCE 25 MG PO TABS: 25.0000 mg | ORAL_TABLET | Freq: Every day | ORAL | 0 refills | Status: AC

## 2022-06-15 MED ORDER — HYDROXYZINE HCL 25 MG PO TABS: 25.0000 mg | ORAL_TABLET | Freq: Three times a day (TID) | ORAL | Status: DC | PRN

## 2022-06-15 NOTE — Telephone Encounter (Signed)
Spoke with patient to get clarity on  empagliflozin (JARDIANCE) 25 MG TABS tablet 11/24/21 02/04/22

## 2022-06-15 NOTE — Discharge Instructions (Addendum)

## 2022-06-15 NOTE — Telephone Encounter (Signed)
Spoke with patient and advised her that Jaurdiance was only filled for one month and that endocrinology should be taking over care for diabetes from here on out she states that she has an appointment with Endocrinology on 06/23/22

## 2022-06-15 NOTE — ED Provider Notes (Signed)
Behavioral Health Urgent Care Medical Screening Exam  Patient Name: Jean Calderon MRN: [id number] Date of Evaluation: 06/15/22 Chief Complaint:  "I'm having increased anxiety". Diagnosis:  Final diagnoses:  Anxiety state  Grief    History of Present illness: Jean Calderon is a 52 y.o. female.  With psychiatric history of depression, anxiety, and other medical issues, who presented voluntarily as a walk-in to [facility] accompanied by her husband with complaints of worsening anxiety and insomnia.  Patient was seen face-to-face by this provider and chart reviewed.  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 Patient also reported she is still grieving after losing her mom unexpectedly in 03-22-2023and while admitted in the hospital, she was very anxious and scared she would die, due to trauma from the unexpected passing of her mother.  Patient reports difficulty and inability to sleep since after she was discharged from the ICU,and stated last night at about 2 PM she had dozed off, and in her sleep, she was dying and has stopped breathing and was experiencing difficulty catching her breath and she woke up in a panic, scared and crying and was unable to return to sleep due to fear of death and increased anxiety.   Patient reports she has an appointment for 07/09/2022 with her psychiatrist Dr. Sandria Manly at [facility] but is afraid she would not make it without medication.  Patient reports taking hydroxyzine (atarax) in the past at the time after her mother died, and reports the medication was very effective in helping her manage her anxiety.  Patient is requesting refill of this medication until her next psychiatric appointment.  Patient reports she has been going to therapy in 2022/05/21 after her mom had passed away in stopped  attending therapy in November 2023 because she felt she was okay but she feels she has not gotten over the loss and would call her therapist to restart her sessions.  Patient reports she is not currently taking any psychiatric medications and denies history of suicide attempts. Patient denies history of inpatient psychiatric hospitalizations.  Patient denies substance use.  Patient reports she lives at home with her husband and reports they have a very good relationship.  Patient denies presence of a gun or other weapon at home.  Support, encouragement, and reassurance provided about ongoing stressors.  Patient provided with opportunity for questions.  On evaluation, patient is alert, oriented x 4, and cooperative. Speech is clear and coherent. Pt appears well groomed. Eye contact is fair. Mood is anxious, affect is tearful and congruent with mood. Thought process is coherent and thought content is WDL. Pt denies SI/HI/AVH or paranoia. There is no objective indication that the patient is responding to internal stimuli. No delusions elicited during this assessment.   Discussed prescription medication for hydroxyzine 25 mg p.o. every 8 hours as needed anxiety nervousness for 30 pills, no refills sent to pharmacy of choice. Discussed follow-up with outpatient psychiatric provider for refills and medication management.  Patient educated on antianxiety medications in terms of how they work, risks vs benefits, indications for use and lack of dependence with this class of medications. Patient verbalized her understanding.  Flowsheet Row ED from 06/15/2022 in [facility] ED from 05/17/2022 in [facility] ED from 04/09/2022 in [facility] at [facility]  RISK CATEGORY No Risk No Risk No Risk       Psychiatric Specialty Exam  Presentation  General Appearance:Appropriate for Environment  Eye Contact:Fair  Speech:Clear and  Coherent  Speech Volume:Normal  Handedness:Right   Mood and Affect  Mood: Anxious  Affect: Congruent; Tearful   Thought Process  Thought Processes: Coherent  Descriptions of Associations:Intact  Orientation:Full (Time, Place and Person)  Thought Content:WDL    Hallucinations:Other (comment)  Ideas of Reference:None  Suicidal Thoughts:No  Homicidal Thoughts:No   Sensorium  Memory: Immediate Good  Judgment: Good  Insight: Good   Executive Functions  Concentration: Good  Attention Span: Good  Recall: Good  Fund of Knowledge: Good  Language: Good   Psychomotor Activity  Psychomotor Activity: Normal   Assets  Assets: Communication Skills; Desire for Improvement; Resilience; Social Support   Sleep  Sleep: Poor  Number of hours: No data recorded  Physical Exam: Physical Exam Constitutional:      General: She is not in acute distress.    Appearance: She is not diaphoretic.  HENT:     Head: Normocephalic.     Right Ear: External ear normal.     Left Ear: External ear normal.     Nose: No congestion.  Eyes:     General:        Right eye: No discharge.        Left eye: No discharge.  Cardiovascular:     Rate and Rhythm: Normal rate.  Pulmonary:     Effort: No respiratory distress.  Chest:     Chest wall: No tenderness.  Neurological:     Mental Status: She is alert and oriented to person, place, and time.  Psychiatric:        Attention and Perception: Attention and perception normal.        Mood and Affect: Mood is anxious. Affect is tearful.        Speech: Speech normal.        Behavior: Behavior is cooperative.        Thought Content: Thought content normal. Thought content is not paranoid or delusional. Thought content does not include homicidal or suicidal ideation. Thought content does not include homicidal or suicidal plan.        Cognition and Memory: Cognition and memory normal.        Judgment: Judgment normal.     Review of Systems  Constitutional:  Negative for diaphoresis, fever and malaise/fatigue.  HENT:  Negative for congestion.   Eyes:  Negative for discharge.  Respiratory:  Negative for cough, shortness of breath and wheezing.   Cardiovascular:  Negative for chest pain and palpitations.  Gastrointestinal:  Negative for constipation, nausea and vomiting.  Neurological:  Negative for dizziness, seizures, loss of consciousness, weakness and headaches.  Psychiatric/Behavioral:  Negative for depression, hallucinations, substance abuse and suicidal ideas. The patient is nervous/anxious.    Blood pressure (!) 143/57, pulse 86, temperature 98.3 F (36.8 C), temperature source Oral, resp. rate 19, SpO2 100 %. There is no height or weight on file to calculate BMI.  Musculoskeletal: Strength & Muscle Tone: within normal limits Gait & Station: normal Patient leans: N/A   [facility] MSE Discharge Disposition for Follow up and Recommendations: Based on my evaluation the patient does not appear to have an emergency medical condition and can be discharged with resources and follow up care in outpatient services for Medication Management and Individual Therapy  Recommend discharge and follow-up with outpatient psychiatric services for medication management  and therapy.  Prescription script for 30 days, 0 refills of hydroxyzine(atarax) 25 mg PO q8h prn anxiety, nervousness sent to pharmacy of choice. Patient to follow up with her outpatient psychiatric provider appointment as scheduled.  Patient does not meet inpatient psychiatric admission criteria or IVC criteria at this time.  There is no evidence of imminent risk of harm to self or others.  Discussed methods to reduce the risk of self-injury or suicide attempts: Frequent conversations regarding unsafe thoughts. Remove all significant sharps. Remove all firearms. Remove all medications, including over-the-counter meds. Consider lockbox for medications and  having a responsible person dispense medications until patient has strengthened coping skills. Room checks for sharps or other harmful objects. Secure all chemical substances that can be ingested or inhaled.   Please refrain from using alcohol or illicit substances, as they can affect your mood and can cause depression, anxiety or other concerning symptoms.  Alcohol can increase the chance that a person will make reckless decisions, like attempting suicide, and can increase the lethality of a drug overdose.    Discussed crisis plan, calling 911, return to the ED if condition changes or worsens.  Patient verbalized understanding.  Patient discharged home, and condition at discharge is stable.  Mancel Bale, NP 06/15/2022, 11:51 PM

## 2022-06-15 NOTE — Telephone Encounter (Signed)
Spoke with patient regarding the refill for  empagliflozin (JARDIANCE) 25 MG TABS tablet 11/24/21 02/04/22       , asked patient did she discontinue medication in December and she states that she did due to insurance reasons, but she states that since being in hospital that she was told by a provider there that she should resume taking medication , She would like a refill on this medication.   Please advise

## 2022-06-15 NOTE — ED Provider Notes (Incomplete)
Behavioral Health Urgent Care Medical Screening Exam  Patient Name: Jean Calderon MRN: [id number] Date of Evaluation: 06/15/22 Chief Complaint:  "I'm having increased anxiety". Diagnosis:  Final diagnoses:  Anxiety state  Grief    History of Present illness: Jean Calderon is a 52 y.o. female.  With psychiatric history of depression, anxiety, and other medical issues, who presented voluntarily as a walk-in to [facility] accompanied by her husband with complaints of worsening anxiety and insomnia.  Patient was seen face-to-face by this provider and chart reviewed. Per triage note "Pt reports she has medical problems including diabetes, neuropathy, and pancreatitis and was in ICU last week. She has a history of anxiety and says she is fearful she will go to sleep and not wake up. She say one year ago her mother died suddenly and she is grieving. She has been sleeping 2-3 hours per night and describes severe anxiety. She states last night she fell asleep, woke in a panic, and felt the need to run out of the house because she felt she could not breathe. She says she is having difficulty functioning at her job. She contacted her psychiatrist, Dr Geraldine Contras, but the earliest appoint is 07/09/2022. She says she cannot wait that long to have her symptoms addressed. She denies current suicidal ideation, homicidal ideation, psychotic symptoms, or substance use".   Denies visit, patient recounted her traumatic experience last week being admitted in the ICU due to DKA and other medical issues which scared her, increased her anxiety, leaving her with nightmares and fear of dying. Patient also reported losing her mom 04/07/23and is still grieving and while in the hospital, was scared she would die, due to fear of death and the unexpected passing of her mother which has keft her traumatized.  Patient reports difficulty and inability to sleep and stated last night at about 2 PM she had dozed off, and in her  sleep, she was dying and has stopped breathing and was experiencing difficulty catching her breath and she woke up in a panic, scared and crying and was unable to return to sleep due to fear of death and increased anxiety.   Patient reports she has an appointment for 07/09/2022 with her psychiatrist Dr. Sandria Manly at [facility] but is afraid she would not make it without medication.   Patient reports she has been going to therapy in June 06, 2022 after her mom had passed away in stopped attending therapy in November 2023 because she felt she was okay but she feels she has not gotten over the loss and would call her therapist to restart her sessions.  Patient reports she is not currently taking any psychiatric medications and denies history of suicide attempts. Patient denies history of inpatient psychiatric hospitalizations.  Patient denies substance use.  Patient reports she lives at home with her husband and reports they have a very good relationship.  Patient denies presence of a gun or other weapon at home.  Support, encouragement, and reassurance provided about ongoing stressors.  Patient provided with opportunity for questions.    Flowsheet Row ED from 06/15/2022 in [facility] ED from 05/17/2022 in [facility] at [facility] ED from 04/09/2022 in [facility] at [facility] RISK CATEGORY No Risk No Risk No Risk       Psychiatric Specialty Exam  Presentation  General Appearance:Appropriate for Environment  Eye Contact:Fair  Speech:Clear and Coherent  Speech Volume:Normal  Handedness:Right   Mood and Affect  Mood: Anxious  Affect: Congruent; Tearful   Thought Process  Thought Processes: Coherent  Descriptions of Associations:Intact  Orientation:Full (Time, Place and Person)  Thought Content:WDL    Hallucinations:Other (comment)  Ideas of Reference:None  Suicidal Thoughts:No  Homicidal Thoughts:No   Sensorium   Memory: Immediate Good  Judgment: Good  Insight: Good   Executive Functions  Concentration: Good  Attention Span: Good  Recall: Good  Fund of Knowledge: Good  Language: Good   Psychomotor Activity  Psychomotor Activity: Normal   Assets  Assets: Communication Skills; Desire for Improvement; Resilience; Social Support   Sleep  Sleep: Poor  Number of hours: No data recorded  Physical Exam: Physical Exam Constitutional:      General: She is not in acute distress.    Appearance: She is not diaphoretic.  HENT:     Head: Normocephalic.     Right Ear: External ear normal.     Left Ear: External ear normal.     Nose: No congestion.  Eyes:     General:        Right eye: No discharge.        Left eye: No discharge.  Cardiovascular:     Rate and Rhythm: Normal rate.  Pulmonary:     Effort: No respiratory distress.  Chest:     Chest wall: No tenderness.  Neurological:     Mental Status: She is alert and oriented to person, place, and time.  Psychiatric:        Attention and Perception: Attention and perception normal.        Mood and Affect: Mood is anxious. Affect is tearful.        Speech: Speech normal.        Behavior: Behavior is cooperative.        Thought Content: Thought content normal. Thought content is not paranoid or delusional. Thought content does not include homicidal or suicidal ideation. Thought content does not include homicidal or suicidal plan.        Cognition and Memory: Cognition and memory normal.        Judgment: Judgment normal.    Review of Systems  Constitutional:  Negative for diaphoresis, fever and malaise/fatigue.  HENT:  Negative for congestion.   Eyes:  Negative for discharge.  Respiratory:  Negative for cough, shortness of breath and wheezing.   Cardiovascular:  Negative for chest pain and palpitations.  Gastrointestinal:  Negative for constipation, nausea and vomiting.  Neurological:  Negative for dizziness,  seizures, loss of consciousness, weakness and headaches.  Psychiatric/Behavioral:  Negative for depression, hallucinations, substance abuse and suicidal ideas. The patient is nervous/anxious.    Blood pressure (!) 143/57, pulse 86, temperature 98.3 F (36.8 C), temperature source Oral, resp. rate 19, SpO2 100 %. There is no height or weight on file to calculate BMI.  Musculoskeletal: Strength & Muscle Tone: within normal limits Gait & Station: normal Patient leans: N/A   [facility] MSE Discharge Disposition for Follow up and Recommendations: Based on my evaluation the patient does not appear to have an emergency medical condition and can be discharged with resources and follow up care in outpatient services for Medication Management and Individual Therapy  Recommend discharge and follow-up with outpatient psychiatric services for medication management and therapy.  Prescription script for 30 days, 0 refills of hydroxyzine(atarax) 25 mg PO q8h prn anxiety, nervousness sent to pharmacy of choice. Patient to follow up with her outpatient psychiatric provider appointment as scheduled.  Patient does not meet inpatient  psychiatric admission criteria or IVC criteria at this time.  There is no evidence of imminent risk of harm to self or others.  Discussed methods to reduce the risk of self-injury or suicide attempts: Frequent conversations regarding unsafe thoughts. Remove all significant sharps. Remove all firearms. Remove all medications, including over-the-counter meds. Consider lockbox for medications and having a responsible person dispense medications until patient has strengthened coping skills. Room checks for sharps or other harmful objects. Secure all chemical substances that can be ingested or inhaled.   Please refrain from using alcohol or illicit substances, as they can affect your mood and can cause depression, anxiety or other concerning symptoms.  Alcohol can increase the chance that a  person will make reckless decisions, like attempting suicide, and can increase the lethality of a drug overdose.    Discussed crisis plan, calling 911, return to the ED if condition changes or worsens.  Patient verbalized understanding.  Patient discharged home, and condition at discharge is stable.  Mancel Bale, NP 06/15/2022, 11:51 PM

## 2022-06-15 NOTE — Progress Notes (Signed)
   06/15/22 1915  [facility] Triage Screening (Walk-ins at [facility] only)  How Did You Hear About [place]? Self  What Is the Reason for Your Visit/Call Today? Pt reports she has medical problems including diabetes, neuropathy, and pancreatitis and was in ICU last week. She has a history of anxiety and says she is fearful she will go to sleep and not wake up. She say one year ago her mother died suddenly and she is grieving. She has been sleeping 2-3 hours per night and describes severe anxiety. She states last night she fell asleep, woke in a panic, and felt the need to run out of the house because she felt she could not breathe. She says she is having difficulty functioning at her job. She contacted her psychiatrist, Dr Geraldine Contras, but the earliest appoint is 07/09/2022. She says she cannot wait that long to have her symptoms addressed. She denies current suicidal ideation, homicidal ideation, psychotic symptoms, or substance use.  How Long Has This Been Causing You Problems? 1 wk - 1 month  Have You Recently Had Any Thoughts About Hurting Yourself? No  Are You Planning to Commit Suicide/Harm Yourself At This time? No  Have you Recently Had Thoughts About Hurting Someone Karolee Ohs? No  Are You Planning To Harm Someone At This Time? No  Are you currently experiencing any auditory, visual or other hallucinations? No  Have You Used Any Alcohol or Drugs in the Past 24 Hours? No  Do you have any current medical co-morbidities that require immediate attention? No  Clinician description of patient physical appearance/behavior: Pt is neatly dressed and well-groomed. She alert and oriented x4. Pt speaks in a clear tone, at moderate volume and normal pace. Motor behavior appears normal. Eye contact is good and Pt is tearful at times. Pt's mood is anxious and affect is congruent with mood. Thought process is coherent and relevant. There is no indication she is currently responding to internal stimuli or experiencing delusional  thought content. She is pleasant and cooperative.  What Do You Feel Would Help You the Most Today? Treatment for Depression or other mood problem;Medication(s)  If access to [facility] was not available, would you have sought care in the Emergency Department? No  Determination of Need Routine (7 days)  Options For Referral Medication Management;Outpatient Therapy

## 2022-06-21 ENCOUNTER — Telehealth: Payer: Self-pay

## 2022-06-21 NOTE — Telephone Encounter (Signed)
Initiated Prior authorization ZOX:[id number] G6 Pods (Gen 5) Via: Covermymeds Case/Key:[id number] Status: Pending as of 06/21/22 Reason: Notified Pt via: Mychart

## 2022-06-23 ENCOUNTER — Encounter: Payer: Self-pay | Admitting: Family Medicine

## 2022-06-23 ENCOUNTER — Encounter: Payer: Medicaid Other | Admitting: Physical Medicine and Rehabilitation

## 2022-06-23 DIAGNOSIS — E1065 Type 1 diabetes mellitus with hyperglycemia: Secondary | ICD-10-CM | POA: Insufficient documentation

## 2022-07-01 ENCOUNTER — Ambulatory Visit: Payer: BLUE CROSS/BLUE SHIELD | Admitting: Family Medicine

## 2022-07-05 NOTE — Progress Notes (Deleted)
Referring-Alethea Rucker MD Reason for referral-CAD  HPI: 52 year old female for evaluation of coronary artery disease at request of Sundra Aland MD.  Previously followed by Dr. Desma Maxim in [facility].  Apparently had PCI of the LAD in [place] in 2010.  Based on records apparently has had difficulties with recurrent chest pain.  Cardiac catheterization March 2022 showed 50% proximal LAD, 40% distal and otherwise 30% lesions diffusely.  Also with a history of obstructive sleep apnea, tobacco abuse.  CTA January 2023 showed no pulmonary embolus but there was note of coronary calcification and aortic atherosclerosis.  Laboratories April 2024 showed TSH 0.85, hemoglobin 13.3, creatinine 0.99  Current Outpatient Medications  Medication Sig Dispense Refill   acetaminophen (TYLENOL) 325 MG tablet Take 2 tablets (650 mg total) by mouth every 6 (six) hours as needed for moderate pain. 30 tablet 0   albuterol (VENTOLIN HFA) 108 (90 Base) MCG/ACT inhaler Inhale 2 puffs into the lungs every 4 (four) hours as needed for shortness of breath or wheezing. 1 each 1   aspirin EC 81 MG tablet Take 81 mg by mouth daily.     atorvastatin (LIPITOR) 40 MG tablet Take 1 tablet (40 mg total) by mouth daily. 90 tablet 1   cloNIDine (CATAPRES) 0.1 MG tablet Take 0.1 mg by mouth 2 (two) times daily.     clopidogrel (PLAVIX) 75 MG tablet Take 1 tablet (75 mg total) by mouth daily. 90 tablet 1   Continuous Blood Gluc Receiver (DEXCOM G7 RECEIVER) DEVI Check sugars daily Dx E11.9 1 each 2   Continuous Blood Gluc Sensor (DEXCOM G7 SENSOR) MISC Check sugars daily Dx E11.9 1 each 5   Crisaborole (EUCRISA) 2 % OINT Apply 1 Application topically 2 (two) times daily.     cyclobenzaprine (FLEXERIL) 10 MG tablet TAKE 1 TABLET(10 MG) BY MOUTH THREE TIMES DAILY AS NEEDED FOR MUSCLE SPASMS 90 tablet 0   Estradiol-Norethindrone Acet 0.5-0.1 MG tablet Take 1 tablet by mouth daily.     fexofenadine (ALLEGRA) 180 MG tablet Take 1  tablet (180 mg total) by mouth daily. 90 tablet 3   HUMALOG 100 UNIT/ML injection INJECT 25 UNITS INTO THE SKIN 3 TIMES DAILY BEFORE MEALS. USE INSULIN PUMP 10 mL 1   hydrOXYzine (ATARAX) 25 MG tablet Take 1 tablet (25 mg total) by mouth every 8 (eight) hours as needed. 45 tablet 1   Insulin Disposable Pump (OMNIPOD 5 G6 PODS, GEN 5,) MISC CHANGE POD EVERY OTHER DAY. 30 each 0   isosorbide mononitrate (IMDUR) 30 MG 24 hr tablet Take 1 tablet (30 mg total) by mouth daily. 90 tablet 1   JARDIANCE 25 MG TABS tablet Take 1 tablet (25 mg total) by mouth daily. 30 tablet 0   lisinopril-hydrochlorothiazide (ZESTORETIC) 20-12.5 MG tablet Take 1 tablet by mouth daily.     metoprolol succinate (TOPROL-XL) 100 MG 24 hr tablet Take 100 mg by mouth daily.     mometasone (NASONEX) 50 MCG/ACT nasal spray One spray in each nostril twice a day, use left hand for right nostril, and right hand for left nostril.  Please dispense one bottle. 1 g 6   montelukast (SINGULAIR) 10 MG tablet TAKE 1 TABLET(10 MG) BY MOUTH AT BEDTIME 30 tablet 3   nitroGLYCERIN (NITROSTAT) 0.4 MG SL tablet Place 0.4 mg under the tongue every 5 (five) minutes as needed for chest pain.     olopatadine (PATADAY) 0.1 % ophthalmic solution Place 1 drop into both eyes 2 (two) times  daily. 5 mL 3   pregabalin (LYRICA) 200 MG capsule Take 1 capsule (200 mg total) by mouth 3 (three) times daily. 90 capsule 1   SKYRIZI PEN 150 MG/ML SOAJ Inject 1 mL into the skin every 3 (three) months.     topiramate (TOPAMAX) 100 MG tablet Take by mouth 2 (two) times daily.     traZODone (DESYREL) 100 MG tablet Take 100-200 mg by mouth at bedtime.     venlafaxine XR (EFFEXOR XR) 37.5 MG 24 hr capsule Take 1 capsule (37.5 mg total) by mouth daily with breakfast. 90 capsule 1   No current facility-administered medications for this visit.    Allergies  Allergen Reactions   Tramadol     Other reaction(s): Mental Status Changes (intolerance) Caused Hallucinations    Nsaids Other (See Comments)    Cannot take because she's on Plavix.   Other reaction(s): Bleeding (intolerance) Cannot take because she's on Plavix.    Sulfamethoxazole-Trimethoprim Nausea And Vomiting    History of Colitis   Ibuprofen     Not an allergy. Pt stated she can't take it because of current use of ASA and Plavix     Past Medical History:  Diagnosis Date   CAD (coronary artery disease)    Cervical radiculopathy 08/15/2017   CKD (chronic kidney disease), stage II    COVID-19 01/2022   DM (diabetes mellitus) (HCC)    HTN (hypertension)    Lumbar radiculopathy 06/21/2019   Last Assessment & Plan: Formatting of this note might be different from the original. Scheduled for laminectomy L5 4/27 Pre-op complete at hospital; labs and ekg reviewed. Scheduled for cardiology eval in one day Medical cleared for procedure by IM evaluation.   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 Severe tendinosis of the supraspinatus tendon.  2. Mild tendinosis of the infraspinatus tendon.  3. Thickening of the inferior joint capsule and intermediate signal  material effacing the normal subcoracoid fat as can be seen with  adhesive capsulitis.    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    Past Surgical History:  Procedure Laterality Date   APPENDECTOMY     BACK SURGERY     CESAREAN SECTION     CORONARY ANGIOPLASTY WITH STENT PLACEMENT     MULTIPLE TOOTH EXTRACTIONS     ROTATOR CUFF REPAIR      Social History   Socioeconomic History   Marital status: Divorced    Spouse name: Not on file   Number of children: Not on file   Years of education: Not on file   Highest education level: Not on file  Occupational History   Not on file  Tobacco Use   Smoking status: Former    Types: Cigarettes   Smokeless tobacco: Never   Vaping Use   Vaping Use: Never used  Substance and Sexual Activity   Alcohol use: Not Currently   Drug use: Never   Sexual activity: Not on file  Other Topics Concern   Not on file  Social History Narrative   Not on file   Social Determinants of Health   Financial Resource Strain: Not on file  Food Insecurity: Not on file  Transportation Needs: Not on file  Physical Activity: Not on file  Stress: Not on file  Social Connections: Not on file  Intimate Partner Violence: Not on  file    Family History  Problem Relation Age of Onset   Heart disease Mother    Heart disease Sister     ROS: no fevers or chills, productive cough, hemoptysis, dysphasia, odynophagia, melena, hematochezia, dysuria, hematuria, rash, seizure activity, orthopnea, PND, pedal edema, claudication. Remaining systems are negative.  Physical Exam:   There were no vitals taken for this visit.  General:  Well developed/well nourished in NAD Skin warm/dry Patient not depressed No peripheral clubbing Back-normal HEENT-normal/normal eyelids Neck supple/normal carotid upstroke bilaterally; no bruits; no JVD; no thyromegaly chest - CTA/ normal expansion CV - RRR/normal S1 and S2; no murmurs, rubs or gallops;  PMI nondisplaced Abdomen -NT/ND, no HSM, no mass, + bowel sounds, no bruit 2+ femoral pulses, no bruits Ext-no edema, chords, 2+ DP Neuro-grossly nonfocal  ECG - personally reviewed  A/P  1 chest pain-  2 coronary artery disease-  3 hyperlipidemia-  4 hypertension-  Olga Millers, MD

## 2022-07-12 ENCOUNTER — Ambulatory Visit: Payer: BLUE CROSS/BLUE SHIELD | Admitting: Family Medicine

## 2022-07-12 ENCOUNTER — Telehealth: Payer: Self-pay | Admitting: Family Medicine

## 2022-07-12 NOTE — Telephone Encounter (Signed)
Saw pt was scheduled today for high sugars. Pt is insulin pump dependent. She has established care with her Endocrinologist who is managing her pumps.  I have called pt to discuss this with her and advised her to follow up with her endocrinologist; as I don't manage insulin pumps. Pt voiced understanding. May cancel appt for this afternoon.

## 2022-07-12 NOTE — Telephone Encounter (Signed)
Appointment cancelled. Katha Hamming

## 2022-07-13 ENCOUNTER — Other Ambulatory Visit: Payer: Self-pay | Admitting: Family Medicine

## 2022-07-14 ENCOUNTER — Ambulatory Visit
Admission: EM | Admit: 2022-07-14 | Discharge: 2022-07-14 | Disposition: A | Discharge status: Home / Self Care | Payer: BLUE CROSS/BLUE SHIELD

## 2022-07-14 ENCOUNTER — Ambulatory Visit: Payer: Medicaid Other | Admitting: Cardiology

## 2022-07-14 ENCOUNTER — Encounter: Payer: Self-pay | Admitting: Emergency Medicine

## 2022-07-14 DIAGNOSIS — H1032 Unspecified acute conjunctivitis, left eye: Secondary | ICD-10-CM

## 2022-07-14 MED ORDER — ACETAMINOPHEN 325 MG PO TABS
650.0000 mg | ORAL_TABLET | Freq: Once | ORAL | Status: AC
  Administered 2022-07-14: 650 mg via ORAL

## 2022-07-14 MED ORDER — TOBRAMYCIN 0.3 % OP SOLN: 1.0000 [drp] | OPHTHALMIC | 0 refills | Status: AC

## 2022-07-14 NOTE — ED Provider Notes (Signed)
[facility]    CSN: [id number] Arrival date & time: 07/14/22  1156      History   Chief Complaint Chief Complaint  Patient presents with   Eye Pain    left    HPI [name] is a 52 y.o. female.   Pt complains of left eye pain.  Pt is wearing contacts.  Pt denies any injury.  Pt has had previous eye infections and  The history is provided by the patient. No language interpreter was used.  Eye Pain This is a new problem. The current episode started 3 to 5 hours ago. The problem occurs constantly. Nothing aggravates the symptoms. She has tried nothing for the symptoms. The treatment provided no relief.    Past Medical History:  Diagnosis Date   CAD (coronary artery disease)    Cervical radiculopathy 08/15/2017   CKD (chronic kidney disease), stage II    COVID-19 01/2022   DM (diabetes mellitus) (HCC)    HTN (hypertension)    Lumbar radiculopathy 06/21/2019   Last Assessment & Plan: Formatting of this note might be different from the original. Scheduled for laminectomy L5 4/27 Pre-op complete at hospital; labs and ekg reviewed. Scheduled for cardiology eval in one day Medical cleared for procedure by IM evaluation.   Malignant hyperthermia    Myocardial infarction 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 Severe tendinosis of the supraspinatus tendon.  2. Mild tendinosis of the infraspinatus tendon.  3. Thickening of the inferior joint capsule and intermediate signal  material effacing the normal subcoracoid fat as can be seen with  adhesive capsulitis.    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    Patient Active Problem List   Diagnosis Date Noted   Uncontrolled type 1 diabetes mellitus with hyperglycemia, with long-term current use of insulin (HCC) 06/23/2022   CKD stage G3b/A3, GFR 30-44 and albumin creatinine ratio >300 mg/g (HCC)  06/06/2022   Transaminitis 06/06/2022   Acute pancreatitis 06/06/2022   Ulnar neuropathy of both upper extremities 12/23/2021   Hyperlipidemia 10/01/2021   Diabetic neuropathy (HCC) 09/30/2021   Meralgia paresthetica, right lower limb 09/30/2021   Gait instability 07/30/2021   Migraine without status migrainosus, not intractable 07/30/2021   Poorly controlled type 2 diabetes mellitus with peripheral neuropathy (HCC) 06/05/2021   Menopausal symptoms 04/15/2021   Anxiety 03/25/2021   CAD (coronary artery disease)    HTN (hypertension)    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   Heart valve disease 01/06/2021   Vitamin B12 deficiency 12/03/2020   Vitamin D deficiency 12/03/2020   Chronic left shoulder pain 11/25/2020   Generalized anxiety disorder 11/25/2020   Psoriasis 01/01/2020   Lumbar radiculopathy 06/21/2019   Uterine leiomyoma 03/06/2019   Depression with anxiety 02/02/2018   Primary insomnia 02/02/2018   Class 2 severe obesity due to excess calories with serious comorbidity and body mass index (BMI) of 38.0 to 38.9 in adult (HCC) 10/12/2017   Insulin pump in place 10/12/2017   Cervical radiculopathy 08/15/2017   Family history of early CAD 07/21/2017   Obstructive sleep apnea syndrome 07/21/2017   Chronic right shoulder pain 05/30/2017   Type 2 diabetes mellitus with mild nonproliferative diabetic retinopathy without macular edema, bilateral (HCC) 03/18/2017   Congestive heart failure (HCC) 03/16/2017   Other cervical disc degeneration, unspecified cervical region 11/11/2016   Adhesive capsulitis of right shoulder 10/08/2016  GERD (gastroesophageal reflux disease) 07/19/2016   Spinal stenosis of cervical region 12/01/2015   Cervical disc herniation 12/01/2015   Bilateral carpal tunnel syndrome 11/04/2015   Lesion of ulnar nerve, left upper limb 11/04/2015   Stented coronary artery 07/07/2015    Past Surgical History:  Procedure Laterality Date   APPENDECTOMY      BACK SURGERY     CESAREAN SECTION     CORONARY ANGIOPLASTY WITH STENT PLACEMENT     MULTIPLE TOOTH EXTRACTIONS     ROTATOR CUFF REPAIR      OB History   No obstetric history on file.      Home Medications    Prior to Admission medications   Medication Sig Start Date End Date Taking? Authorizing Provider  Continuous Glucose Transmitter (DEXCOM G6 TRANSMITTER) MISC  07/01/22  Yes [provider]  Insulin Aspart, w/Niacinamide, (FIASP) 100 UNIT/ML SOLN To be used with insulin pump. 07/08/22  Yes [provider]  acetaminophen (TYLENOL) 325 MG tablet Take 2 tablets (650 mg total) by mouth every 6 (six) hours as needed for moderate pain. 04/09/22   Wallis Bamberg, PA-C  albuterol (VENTOLIN HFA) 108 (90 Base) MCG/ACT inhaler Inhale 2 puffs into the lungs every 4 (four) hours as needed for shortness of breath or wheezing. 01/22/22   Lattie Haw, MD  aspirin EC 81 MG tablet Take 81 mg by mouth daily.    [provider]  atorvastatin (LIPITOR) 40 MG tablet Take 1 tablet (40 mg total) by mouth daily. 06/14/22   Suzan Slick, MD  cloNIDine (CATAPRES) 0.1 MG tablet Take 0.1 mg by mouth 2 (two) times daily. 01/08/21   [provider]  clopidogrel (PLAVIX) 75 MG tablet Take 1 tablet (75 mg total) by mouth daily. 05/24/22   Suzan Slick, MD  Continuous Blood Gluc Receiver (DEXCOM G7 RECEIVER) DEVI Check sugars daily Dx E11.9 04/05/22   Suzan Slick, MD  Continuous Blood Gluc Sensor (DEXCOM G7 SENSOR) MISC Check sugars daily Dx E11.9 04/05/22   Suzan Slick, MD  Crisaborole (EUCRISA) 2 % OINT Apply 1 Application topically 2 (two) times daily. 06/11/20   [provider]  cyclobenzaprine (FLEXERIL) 10 MG tablet TAKE 1 TABLET(10 MG) BY MOUTH THREE TIMES DAILY AS NEEDED FOR MUSCLE SPASMS 04/12/22   Suzan Slick, MD  Estradiol-Norethindrone Acet 0.5-0.1 MG tablet Take 1 tablet by mouth daily. 12/18/20   [provider]  fexofenadine  (ALLEGRA) 180 MG tablet Take 1 tablet (180 mg total) by mouth daily. 05/06/22   Rucker, Magdalen Spatz, MD  HUMALOG 100 UNIT/ML injection INJECT 25 UNITS INTO THE SKIN 3 TIMES DAILY BEFORE MEALS. USE INSULIN PUMP 05/12/22   Suzan Slick, MD  hydrOXYzine (ATARAX) 25 MG tablet Take 1 tablet (25 mg total) by mouth every 8 (eight) hours as needed. 06/14/22   Rucker, Magdalen Spatz, MD  Insulin Disposable Pump (OMNIPOD 5 G6 PODS, GEN 5,) MISC CHANGE POD EVERY OTHER DAY. 06/07/22   Suzan Slick, MD  isosorbide mononitrate (IMDUR) 30 MG 24 hr tablet Take 1 tablet (30 mg total) by mouth daily. 02/04/22   Rucker, Magdalen Spatz, MD  JARDIANCE 25 MG TABS tablet Take 1 tablet (25 mg total) by mouth daily. 06/15/22   Suzan Slick, MD  lisinopril-hydrochlorothiazide (ZESTORETIC) 20-12.5 MG tablet Take 1 tablet by mouth daily. 12/04/20   [provider]  metoprolol succinate (TOPROL-XL) 100 MG 24 hr tablet Take 100 mg by mouth daily. 12/04/20  [provider]  mometasone (NASONEX) 50 MCG/ACT nasal spray One spray in each nostril twice a day, use left hand for right nostril, and right hand for left nostril.  Please dispense one bottle. 05/24/22   Rucker, Magdalen Spatz, MD  montelukast (SINGULAIR) 10 MG tablet TAKE 1 TABLET(10 MG) BY MOUTH AT BEDTIME 06/16/22   Rucker, Magdalen Spatz, MD  nitroGLYCERIN (NITROSTAT) 0.4 MG SL tablet Place 0.4 mg under the tongue every 5 (five) minutes as needed for chest pain. 01/05/21   [provider]  olopatadine (PATADAY) 0.1 % ophthalmic solution Place 1 drop into both eyes 2 (two) times daily. 05/24/22   Suzan Slick, MD  pregabalin (LYRICA) 200 MG capsule Take 1 capsule (200 mg total) by mouth 3 (three) times daily. 04/21/22   Rucker, Magdalen Spatz, MD  SKYRIZI PEN 150 MG/ML SOAJ Inject 1 mL into the skin every 3 (three) months. 02/24/21   [provider]  topiramate (TOPAMAX) 100 MG tablet Take by mouth 2 (two) times daily. 08/17/21   [provider]   traZODone (DESYREL) 100 MG tablet Take 100-200 mg by mouth at bedtime.    [provider]  venlafaxine XR (EFFEXOR XR) 37.5 MG 24 hr capsule Take 1 capsule (37.5 mg total) by mouth daily with breakfast. 02/04/22   Suzan Slick, MD    Family History Family History  Problem Relation Age of Onset   Heart disease Mother    Heart disease Sister     Social History Social History   Tobacco Use   Smoking status: Former    Types: Cigarettes   Smokeless tobacco: Never  Vaping Use   Vaping Use: Never used  Substance Use Topics   Alcohol use: Not Currently   Drug use: Never     Allergies   Tramadol, Nsaids, Sulfamethoxazole-trimethoprim, and Ibuprofen   Review of Systems Review of Systems  Eyes:  Positive for pain.  All other systems reviewed and are negative.    Physical Exam Triage Vital Signs ED Triage Vitals  Enc Vitals Group     BP 07/14/22 1211 106/74     Pulse Rate 07/14/22 1211 72     Resp 07/14/22 1211 16     Temp 07/14/22 1211 97.8 F (36.6 C)     Temp Source 07/14/22 1211 Oral     SpO2 07/14/22 1211 95 %     Weight 07/14/22 1214 274 lb 14.6 oz (124.7 kg)     Height 07/14/22 1214 5\' 9"  (1.753 m)     Head Circumference --      Peak Flow --      Pain Score 07/14/22 1213 9     Pain Loc --      Pain Edu? --      Excl. in GC? --    No data found.  Updated Vital Signs BP 106/74 (BP Location: Left Arm)   Pulse 72   Temp 97.8 F (36.6 C) (Oral)   Resp 16   Ht 5\' 9"  (1.753 m)   Wt 124.7 kg   SpO2 95%   BMI 40.60 kg/m   Visual Acuity Right Eye Distance:   Left Eye Distance:   Bilateral Distance:    Right Eye Near:   Left Eye Near:    Bilateral Near:     Physical Exam Vitals and nursing note reviewed.  Constitutional:      Appearance: She is well-developed.  HENT:     Head: Normocephalic.  Eyes:  Extraocular Movements: Extraocular movements intact.     Pupils: Pupils are equal, round, and reactive to light.     Comments: Pt  reports some relief with tetracaine,  no foreign body  no fluoro uptake  Pulmonary:     Effort: Pulmonary effort is normal.  Abdominal:     General: There is no distension.  Musculoskeletal:        General: Normal range of motion.     Cervical back: Normal range of motion.  Neurological:     Mental Status: She is alert and oriented to person, place, and time.      UC Treatments / Results  Labs (all labs ordered are listed, but only abnormal results are displayed) Labs Reviewed - No data to display  EKG   Radiology No results found.  Procedures Procedures (including critical care time)  Medications Ordered in UC Medications  acetaminophen (TYLENOL) tablet 650 mg (650 mg Oral Given 07/14/22 1217)    Initial Impression / Assessment and Plan / UC Course  I have reviewed the triage vital signs and the nursing notes.  Pertinent labs & imaging results that were available during my care of the patient were reviewed by me and considered in my medical decision making (see chart for details).     Pt given rx for tobrex.  Pt advised to follow up with her eye doctor tomorrow if not improving Final Clinical Impressions(s) / UC Diagnoses   Final diagnoses:  Acute conjunctivitis of left eye, unspecified acute conjunctivitis type     Discharge Instructions      See your Eye doctor tomorrow for recheck if not improving     ED Prescriptions   None    PDMP not reviewed this encounter. An After Visit Summary was printed and given to the patient.       Elson Areas, [place] 07/14/22 1341

## 2022-07-14 NOTE — ED Notes (Signed)
Gauze eye patch to left eye w/ paper tape

## 2022-07-14 NOTE — Discharge Instructions (Signed)
See your Eye doctor tomorrow for recheck if not improving

## 2022-07-14 NOTE — ED Triage Notes (Signed)
Left eye pain started prior to arrival  Eye exam on 5/18 Pt states she can not open left eye due to pain

## 2022-07-19 ENCOUNTER — Encounter
Payer: BLUE CROSS/BLUE SHIELD | Attending: Physical Medicine and Rehabilitation | Admitting: Physical Medicine and Rehabilitation

## 2022-07-19 ENCOUNTER — Encounter: Payer: Self-pay | Admitting: Physical Medicine and Rehabilitation

## 2022-07-19 VITALS — BP 96/62 | HR 70 | Ht 69.0 in | Wt 282.8 lb

## 2022-07-19 DIAGNOSIS — Z981 Arthrodesis status: Secondary | ICD-10-CM | POA: Insufficient documentation

## 2022-07-19 DIAGNOSIS — R202 Paresthesia of skin: Secondary | ICD-10-CM | POA: Insufficient documentation

## 2022-07-19 DIAGNOSIS — G894 Chronic pain syndrome: Secondary | ICD-10-CM | POA: Insufficient documentation

## 2022-07-19 DIAGNOSIS — G8929 Other chronic pain: Secondary | ICD-10-CM | POA: Insufficient documentation

## 2022-07-19 DIAGNOSIS — R200 Anesthesia of skin: Secondary | ICD-10-CM

## 2022-07-19 DIAGNOSIS — M545 Low back pain, unspecified: Secondary | ICD-10-CM | POA: Diagnosis present

## 2022-07-19 DIAGNOSIS — E1142 Type 2 diabetes mellitus with diabetic polyneuropathy: Secondary | ICD-10-CM | POA: Diagnosis present

## 2022-07-19 DIAGNOSIS — Z794 Long term (current) use of insulin: Secondary | ICD-10-CM

## 2022-07-19 DIAGNOSIS — M533 Sacrococcygeal disorders, not elsewhere classified: Secondary | ICD-10-CM | POA: Diagnosis present

## 2022-07-19 MED ORDER — DULOXETINE HCL 30 MG PO CPEP: ORAL_CAPSULE | ORAL | 3 refills | Status: DC

## 2022-07-19 NOTE — Patient Instructions (Signed)
  Chronic pain syndrome Multifactorial, due to arthritic pain in the low back, peripheral polyneuropathy, possible left lower extremity meralgia paresthetica versus radiculopathy, and left sacroiliitis  Diabetic polyneuropathy associated with type 2 diabetes mellitus (HCC) Numbness and tingling of left leg Past EMG only showed peripheral neuropathy, questionable left-sided meralgia paresthetica; no radiculopathy reported, however unable to view the full report at this time.  Please request records from prior lumbar MRI for full review  Start duloxetine 30 mg 1 tablet once daily.  After 1 week, message me through MyChart let me know how it is going; if tolerating medication well, can increase to 1 tablet twice daily.  Continue Lyrica at current dose   Chronic midline low back pain, unspecified whether sciatica present History of lumbar fusion Chronic left SI joint pain Medication options limited due to history of pancreatitis, severe diabetes, and cardiac disease  I will refer you to Dr. Wynn Banker for evaluation of the left SI joint injection with lidocaine versus ESI with lidocaine versus lumbar medial branch block.  I am also sending you to physical therapy, and have you a work excuse to attend at least 1 day/week over the next 6 to 8 weeks.  I would avoid using a brace at this time, as it can weaken your muscles and cause worsening back pain, we can readdress this next visit.  I gave you information today on desktop ergonomics and possible lumbar support for your workstation.  Follow-up with me in 3 months.

## 2022-07-19 NOTE — Progress Notes (Unsigned)
Subjective:    Patient ID: Jean Calderon, female    DOB: [birth year], 52 y.o.   MRN: [id number]  HPI HPI  Jean Calderon is a 52 y.o. year old female  who  has a past medical history of CAD (coronary artery disease), Cervical radiculopathy (08/15/2017), CKD (chronic kidney disease), stage II, COVID-19 (01/2022), DM (diabetes mellitus) (HCC), HTN (hypertension), Lumbar radiculopathy (06/21/2019), Malignant hyperthermia, Myocardial infarction (HCC), Neuropathy, Osteoarthritis of left shoulder (03/05/2021), Other cervical disc degeneration, unspecified cervical region (11/11/2016), and Ulnar neuropathy of both upper extremities (12/23/2021).   They are presenting to PM&R clinic as a new patient for pain management evaluation. They were referred by Dr. Wyline Mood for treatment of low back and nerve pain.   Source: Low back, L>R thigh, and bilateral feet.  Inciting incident: none  Description of pain: Aching and reduced sensation in bilateral thighs. In feet, burning and sharp. In the low back, non-radiating, L>R stabbing, worse with bending forward Exacerbating factors: bending forward, bending backwards, laying flat, standing, activity, rest, and cold; rain Remitting factors: massage, pain medication, sitting and heating pad Red flag symptoms: No red flags for back pain endorsed in Hx or ROS  Medications tried: Topical medications (no effect) : has done icy hot/bengay, lidocaine patches, voltaren gel, capsaicin cream.  Nsaids (never tried) : Can't have d/t Plavix use Tylenol  (no effect) : Can't take due to pancreatitis Opiates  (mild effect) : Norco 5-10 mg temporary scripts  - Help mostly at nighttime, when pain is the worst  Gabapentin / Lyrica  (no effect) : Lyrica 200 mg bid, increase has not helped. Gabapentin was not helpful.  TCAs  (no effect) :  SNRIs  (never tried) :  Other  (no effect) : Effexor 37.5 mg daily; stopped it because her mood improved Muscle relaxers used to help; stopped  due to her pancreatitis.   Other treatments: PT/OT  (no effect) : for her back, last 2 months ago, went for 3 sessions and then stopped going because she started work and could not get time off for it. Does HEP with bands.   Accupuncture/chiropractor/massage  (never tried) :  TENs unit (no effect) : tried in PT on her shoulders and arms  Injections (unsure of effect) : can't have steroid injections because of her uncontrolled diabetes, per her endocrinologist. Last Ha1c 13.3  Surgery (moderate effect) : Had lumbar fusion L4-5 in [place] 2-3 years ago. Was helpful initially, but has been getting worse over the past year. Never had MBB, was offered at [facility] but she was afraid to do it before.   Having bilateral CTS releases soon.  Other  (moderate effect) : Had a wrapping back brace, which helped significantly.   Goals for pain control: "I want to be able to get around better".  Prior UDS results: No results found for: "LABOPIA", "COCAINSCRNUR", "LABBENZ", "AMPHETMU", "THCU", "LABBARB"    Pain Inventory Average Pain 7 Pain Right Now 6 My pain is constant  In the last 24 hours, has pain interfered with the following? General activity 4 Relation with others 6 Enjoyment of life 10 What TIME of day is your pain at its worst? night Sleep (in general) Poor  Pain is worse with: walking, bending, standing, and some activites Pain improves with:  not noted Relief from Meds: 1  walk without assistance how many minutes can you walk? 10 ability to climb steps?  no do you drive?  yes  employed # of hrs/week 40  bladder control problems  weakness numbness tremor tingling trouble walking spasms anxiety  Any changes since last visit?  no reports she had ?EMG with [facility] and has severe nerve damage  Primary care Jean Aland MD    Family History  Problem Relation Age of Onset   Heart disease Mother    Heart disease Sister    Social History   Socioeconomic History    Marital status: Divorced    Spouse name: Not on file   Number of children: Not on file   Years of education: Not on file   Highest education level: Not on file  Occupational History   Not on file  Tobacco Use   Smoking status: Former    Types: Cigarettes   Smokeless tobacco: Never  Vaping Use   Vaping Use: Never used  Substance and Sexual Activity   Alcohol use: Not Currently   Drug use: Never   Sexual activity: Not on file  Other Topics Concern   Not on file  Social History Narrative   Not on file   Social Determinants of Health   Financial Resource Strain: Not on file  Food Insecurity: Not on file  Transportation Needs: Not on file  Physical Activity: Not on file  Stress: Not on file  Social Connections: Not on file   Past Surgical History:  Procedure Laterality Date   APPENDECTOMY     BACK SURGERY     CESAREAN SECTION     CORONARY ANGIOPLASTY WITH STENT PLACEMENT     MULTIPLE TOOTH EXTRACTIONS     ROTATOR CUFF REPAIR     Past Medical History:  Diagnosis Date   CAD (coronary artery disease)    Cervical radiculopathy 08/15/2017   CKD (chronic kidney disease), stage II    COVID-19 01/2022   DM (diabetes mellitus) (HCC)    HTN (hypertension)    Lumbar radiculopathy 06/21/2019   Last Assessment & Plan: Formatting of this note might be different from the original. Scheduled for laminectomy L5 4/27 Pre-op complete at hospital; labs and ekg reviewed. Scheduled for cardiology eval in one day Medical cleared for procedure by IM evaluation.   Malignant hyperthermia    Myocardial infarction [facility])    Neuropathy    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   1. Severe tendinosis of the supraspinatus tendon.  2. Mild tendinosis of the infraspinatus tendon.  3. Thickening of the inferior joint capsule and intermediate signal  material effacing the normal subcoracoid fat as can be seen with  adhesive capsulitis.    Other cervical disc degeneration, unspecified cervical  region 11/11/2016   Ulnar neuropathy of both upper extremities 12/23/2021   BP 96/62   Pulse 70   Ht 5\' 9"  (1.753 m)   Wt 282 lb 12.8 oz (128.3 kg)   SpO2 96%   BMI 41.76 kg/m   Opioid Risk Score:   Fall Risk Score:  `1  Depression screen [facility] 2/9     07/19/2022    1:24 PM 02/04/2022   11:38 AM  Depression screen PHQ 2/9  Decreased Interest 0 3  Down, Depressed, Hopeless 0 3  PHQ - 2 Score 0 6  Altered sleeping 3 3  Tired, decreased energy 3 3  Change in appetite 3 3  Feeling bad or failure about yourself  2 3  Trouble concentrating 3 3  Moving slowly or fidgety/restless 3 3  Suicidal thoughts 0 0  PHQ-9 Score 17 24    Review of Systems  Constitutional:  Positive for diaphoresis and unexpected  weight change.       Weight gain  HENT: Negative.    Eyes: Negative.   Respiratory: Negative.    Cardiovascular: Negative.   Gastrointestinal:  Positive for abdominal pain.       Had recent bout of pancreatitis  Endocrine:       High and low blood sugars -- has insulin pump on left arm and dexcom on right  Genitourinary:  Positive for difficulty urinating.       Bladder control   Musculoskeletal:  Positive for arthralgias, back pain, gait problem and myalgias.       Spasms  Skin:  Positive for rash.  Neurological:  Positive for tremors, weakness and numbness.       Tingling  Hematological: Negative.   Psychiatric/Behavioral:  The patient is nervous/anxious.   All other systems reviewed and are negative.      Objective:   Physical Exam    PE: Constitution: Appropriate appearance for age. No apparent distress +Obese Resp: No respiratory distress. No accessory muscle usage. on RA and CTAB Cardio: Well perfused appearance. 1+ bilateral peripheral edema. Abdomen: Nondistended. Nontender.   Psych: Appropriate mood and affect. Neuro: AAOx4. No apparent cognitive deficits   Neurologic Exam:   DTRs: Reflexes were 2+ in bilateral achilles, patella, biceps, BR and  triceps. Babinsky: flexor responses b/l.   Hoffmans: negative b/l Sensory exam: Reduce sensation to light touch in bilateral lower extremities, right to the knee, left throughout the entire left anterior thigh, shin, and foot, along with the calf.  Motor exam: strength 5/5 throughout bilateral upper extremities and bilateral lower extremities Coordination: Fine motor coordination was normal.    MSK: Back + TTP lumbar spine, left SI joint, left PSIS Pain worse with forward bending; localized to low back.  No pain with facet loading bilaterally.  Positive left SI joint pain with FADIR, FABER, compression, and Yeomans. Negative on R.      Assessment & Plan:   Jean Calderon is a 52 y.o. year old female  who  has a past medical history of CAD (coronary artery disease), Cervical radiculopathy (08/15/2017), CKD (chronic kidney disease), stage II, COVID-19 (01/2022), DM (diabetes mellitus) (HCC), HTN (hypertension), Lumbar radiculopathy (06/21/2019), Malignant hyperthermia, Myocardial infarction (HCC), Neuropathy, Osteoarthritis of left shoulder (03/05/2021), Other cervical disc degeneration, unspecified cervical region (11/11/2016), and Ulnar neuropathy of both upper extremities (12/23/2021).   They are presenting to PM&R clinic as a new patient for treatment of chronic pain.   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 No reported radiculopathy on testing.   [facility] Lumbar spine 07/2021 unable to obtain. CT lumbar spine April 2024 showing mild degenerative discs and L4-5 PDLF.   Please request records from prior lumbar MRI for full  review  Start duloxetine 30 mg 1 tablet once daily.  After 1 week, message me through MyChart let me know how it is going; if tolerating medication well, can increase to 1 tablet twice daily.  Continue Lyrica at current dose  Follow up with me for Qutenza in 2-3 weeks   Chronic midline low back pain, unspecified whether sciatica present History of lumbar fusion Chronic left SI joint pain  Medication options limited due to history of pancreatitis,  severe diabetes, and cardiac disease  I will refer you to Dr. Wynn Banker for evaluation of the left SI joint injection with lidocaine versus ESI with lidocaine versus lumbar medial branch block.  I am also sending you to physical therapy, and have you a work excuse to attend at least 1 day/week over the next 6 to 8 weeks.  I would avoid using a back brace at this time, as it can weaken your muscles and cause worsening back pain, we can readdress this next visit.  I gave you information today on desktop ergonomics and possible lumbar support for your workstation.  Follow-up with me in 3 months.   Other orders -     DULoxetine HCl; Start with 1 tablet once daily for 1 week.  After this, message your doctor through MyChart to let her know about any potential side effects of the medication; if things are going well, we will increase to 1 tab 2 times daily.  Dispense: 60 capsule; Refill: 3

## 2022-07-20 ENCOUNTER — Emergency Department (HOSPITAL_COMMUNITY)
Admission: EM | Admit: 2022-07-20 | Discharge: 2022-07-20 | Disposition: A | Discharge status: Home / Self Care | Payer: BLUE CROSS/BLUE SHIELD | Attending: Emergency Medicine | Admitting: Emergency Medicine

## 2022-07-20 ENCOUNTER — Emergency Department (HOSPITAL_COMMUNITY): Payer: BLUE CROSS/BLUE SHIELD

## 2022-07-20 ENCOUNTER — Encounter (HOSPITAL_COMMUNITY): Payer: Self-pay

## 2022-07-20 ENCOUNTER — Other Ambulatory Visit: Payer: Self-pay

## 2022-07-20 DIAGNOSIS — Z7982 Long term (current) use of aspirin: Secondary | ICD-10-CM | POA: Insufficient documentation

## 2022-07-20 DIAGNOSIS — B379 Candidiasis, unspecified: Secondary | ICD-10-CM | POA: Diagnosis not present

## 2022-07-20 DIAGNOSIS — Z794 Long term (current) use of insulin: Secondary | ICD-10-CM | POA: Diagnosis not present

## 2022-07-20 DIAGNOSIS — Z7901 Long term (current) use of anticoagulants: Secondary | ICD-10-CM | POA: Insufficient documentation

## 2022-07-20 DIAGNOSIS — R079 Chest pain, unspecified: Secondary | ICD-10-CM | POA: Diagnosis present

## 2022-07-20 LAB — HEPATIC FUNCTION PANEL
ALT: 55 U/L — ABNORMAL HIGH (ref 0–44)
AST: 55 U/L — ABNORMAL HIGH (ref 15–41)
Albumin: 3.8 g/dL (ref 3.5–5.0)
Alkaline Phosphatase: 69 U/L (ref 38–126)
Bilirubin, Direct: 0.2 mg/dL (ref 0.0–0.2)
Indirect Bilirubin: 0.8 mg/dL (ref 0.3–0.9)
Total Bilirubin: 1 mg/dL (ref 0.3–1.2)
Total Protein: 7.5 g/dL (ref 6.5–8.1)

## 2022-07-20 LAB — CBC
HCT: 45.9 % (ref 36.0–46.0)
Hemoglobin: 14.1 g/dL (ref 12.0–15.0)
MCH: 25.7 pg — ABNORMAL LOW (ref 26.0–34.0)
MCHC: 30.7 g/dL (ref 30.0–36.0)
MCV: 83.8 fL (ref 80.0–100.0)
Platelets: 194 10*3/uL (ref 150–400)
RBC: 5.48 MIL/uL — ABNORMAL HIGH (ref 3.87–5.11)
RDW: 14.1 % (ref 11.5–15.5)
WBC: 9.2 10*3/uL (ref 4.0–10.5)
nRBC: 0 % (ref 0.0–0.2)

## 2022-07-20 LAB — BASIC METABOLIC PANEL
Anion gap: 15 (ref 5–15)
BUN: 24 mg/dL — ABNORMAL HIGH (ref 6–20)
CO2: 20 mmol/L — ABNORMAL LOW (ref 22–32)
Calcium: 9.4 mg/dL (ref 8.9–10.3)
Chloride: 98 mmol/L (ref 98–111)
Creatinine, Ser: 1.16 mg/dL — ABNORMAL HIGH (ref 0.44–1.00)
GFR, Estimated: 56 mL/min — ABNORMAL LOW (ref >60)
Glucose, Bld: 254 mg/dL — ABNORMAL HIGH (ref 70–99)
Potassium: 4.4 mmol/L (ref 3.5–5.1)
Sodium: 133 mmol/L — ABNORMAL LOW (ref 135–145)

## 2022-07-20 LAB — LIPASE, BLOOD: Lipase: 50 U/L (ref 11–51)

## 2022-07-20 LAB — D-DIMER, QUANTITATIVE: D-Dimer, Quant: <0.27 ug/mL-FEU (ref 0.00–0.50)

## 2022-07-20 LAB — TROPONIN I (HIGH SENSITIVITY)
Troponin I (High Sensitivity): 4 ng/L (ref <18)
Troponin I (High Sensitivity): 4 ng/L (ref <18)

## 2022-07-20 MED ORDER — SODIUM CHLORIDE 0.9 % IV BOLUS
1000.0000 mL | Freq: Once | INTRAVENOUS | Status: AC
  Administered 2022-07-20: 1000 mL via INTRAVENOUS

## 2022-07-20 MED ORDER — HYDROMORPHONE HCL 1 MG/ML IJ SOLN
0.5000 mg | Freq: Once | INTRAMUSCULAR | Status: AC
  Administered 2022-07-20: 0.5 mg via INTRAVENOUS
  Filled 2022-07-20: qty 1

## 2022-07-20 MED ORDER — NYSTATIN 100000 UNIT/GM EX POWD
Freq: Once | CUTANEOUS | Status: AC
  Filled 2022-07-20: qty 15

## 2022-07-20 MED ORDER — HYDROMORPHONE HCL 1 MG/ML IJ SOLN
1.0000 mg | Freq: Once | INTRAMUSCULAR | Status: AC
  Administered 2022-07-20: 1 mg via INTRAVENOUS
  Filled 2022-07-20: qty 1

## 2022-07-20 MED ORDER — HYDROMORPHONE HCL 1 MG/ML IJ SOLN: 1.0000 mg | Freq: Once | INTRAMUSCULAR | Status: DC

## 2022-07-20 MED ORDER — NYSTATIN 100000 UNIT/GM EX POWD: 1.0000 | Freq: Three times a day (TID) | CUTANEOUS | 1 refills | Status: AC

## 2022-07-20 NOTE — ED Notes (Signed)
IV team at bedside 

## 2022-07-20 NOTE — ED Notes (Signed)
Patient ambulated  to restroom without assistance.

## 2022-07-20 NOTE — ED Notes (Signed)
Shift report received, assumed care of patient at this time.  

## 2022-07-20 NOTE — ED Triage Notes (Signed)
Pt arrives via EMS from home. Pt reports she has been having constant chest pain, sob, and nausea since this morning. Pt took Prilosec with no relief this morning. Pt took 1 sl nitro prior to ems arrival with minimal relief. EMS administered 324mg  asa and 1 additional nitro with relief. Pain has since returned and is now a 9/10.

## 2022-07-20 NOTE — ED Provider Notes (Signed)
[place] EMERGENCY DEPARTMENT AT [facility] Provider Note   CSN: [id number] Arrival date & time: 07/20/22  1627     History  Chief Complaint  Patient presents with   Chest Pain    Jean Calderon is a 52 y.o. female presenting from home with complaint of chest pain.  Patient ports a history of an MI 2010.  She takes aspirin and Plavix.  She has a history of obesity as well.  She reports he began having chest pain this morning that was under her left breast, constant, throbbing, shortness of breath and nausea.  The pain has been constant all day.  It was quite intense.  EMS gave her aspirin and nitro with some relief of the pain but is now returning.  She also reports a separately a recent history of pancreatitis.  HPI     Home Medications Prior to Admission medications   Medication Sig Start Date End Date Taking? Authorizing Provider  nystatin (MYCOSTATIN/NYSTOP) powder Apply 1 Application topically 3 (three) times daily for 10 days. 07/20/22 07/30/22 Yes Quasean Frye, Kermit Balo, MD  acetaminophen (TYLENOL) 325 MG tablet Take 2 tablets (650 mg total) by mouth every 6 (six) hours as needed for moderate pain. 04/09/22   Wallis Bamberg, PA-C  albuterol (VENTOLIN HFA) 108 (90 Base) MCG/ACT inhaler Inhale 2 puffs into the lungs every 4 (four) hours as needed for shortness of breath or wheezing. 01/22/22   Lattie Haw, MD  aspirin EC 81 MG tablet Take 81 mg by mouth daily.    [provider]  atorvastatin (LIPITOR) 40 MG tablet Take 1 tablet (40 mg total) by mouth daily. 06/14/22   Suzan Slick, MD  cloNIDine (CATAPRES) 0.1 MG tablet Take 0.1 mg by mouth 2 (two) times daily. 01/08/21   [provider]  clopidogrel (PLAVIX) 75 MG tablet Take 1 tablet (75 mg total) by mouth daily. 05/24/22   Suzan Slick, MD  Continuous Blood Gluc Receiver (DEXCOM G7 RECEIVER) DEVI Check sugars daily Dx E11.9 04/05/22   Suzan Slick, MD  Continuous Blood Gluc Sensor (DEXCOM G7  SENSOR) MISC Check sugars daily Dx E11.9 04/05/22   Suzan Slick, MD  Continuous Glucose Transmitter (DEXCOM G6 TRANSMITTER) MISC  07/01/22   [provider]  Crisaborole (EUCRISA) 2 % OINT Apply 1 Application topically 2 (two) times daily. 06/11/20   [provider]  DULoxetine (CYMBALTA) 30 MG capsule Start with 1 tablet once daily for 1 week.  After this, message your doctor through MyChart to let her know about any potential side effects of the medication; if things are going well, we will increase to 1 tab 2 times daily. 07/19/22   Elijah Birk C, DO  Estradiol-Norethindrone Acet 0.5-0.1 MG tablet Take 1 tablet by mouth daily. 12/18/20   [provider]  fexofenadine (ALLEGRA) 180 MG tablet Take 1 tablet (180 mg total) by mouth daily. 05/06/22   Rucker, Magdalen Spatz, MD  HUMALOG 100 UNIT/ML injection INJECT 25 UNITS INTO THE SKIN 3 TIMES DAILY BEFORE MEALS. USE INSULIN PUMP 05/12/22   Suzan Slick, MD  hydrOXYzine (ATARAX) 25 MG tablet Take 1 tablet (25 mg total) by mouth every 8 (eight) hours as needed. 06/14/22   Suzan Slick, MD  Insulin Aspart, w/Niacinamide, (FIASP) 100 UNIT/ML SOLN To be used with insulin pump. 07/08/22   [provider]  Insulin Disposable Pump (OMNIPOD 5 G6 PODS, GEN 5,) MISC CHANGE POD EVERY OTHER DAY. 06/07/22  Suzan Slick, MD  isosorbide mononitrate (IMDUR) 30 MG 24 hr tablet Take 1 tablet (30 mg total) by mouth daily. 02/04/22   Rucker, Magdalen Spatz, MD  JARDIANCE 25 MG TABS tablet Take 1 tablet (25 mg total) by mouth daily. 06/15/22   Suzan Slick, MD  lisinopril-hydrochlorothiazide (ZESTORETIC) 20-12.5 MG tablet Take 1 tablet by mouth daily. 12/04/20   [provider]  metoprolol succinate (TOPROL-XL) 100 MG 24 hr tablet Take 100 mg by mouth daily. 12/04/20   [provider]  mometasone (NASONEX) 50 MCG/ACT nasal spray One spray in each nostril twice a day, use left hand for right nostril, and right hand for  left nostril.  Please dispense one bottle. 05/24/22   Rucker, Magdalen Spatz, MD  montelukast (SINGULAIR) 10 MG tablet TAKE 1 TABLET(10 MG) BY MOUTH AT BEDTIME 06/16/22   Rucker, Magdalen Spatz, MD  nitroGLYCERIN (NITROSTAT) 0.4 MG SL tablet Place 0.4 mg under the tongue every 5 (five) minutes as needed for chest pain. 01/05/21   [provider]  olopatadine (PATADAY) 0.1 % ophthalmic solution Place 1 drop into both eyes 2 (two) times daily. 05/24/22   Suzan Slick, MD  pregabalin (LYRICA) 200 MG capsule Take 1 capsule (200 mg total) by mouth 3 (three) times daily. 04/21/22   Suzan Slick, MD  tobramycin (TOBREX) 0.3 % ophthalmic solution Place 1 drop into the right eye every 4 (four) hours for 10 days. 07/14/22 07/24/22  Elson Areas, PA-C  topiramate (TOPAMAX) 100 MG tablet Take by mouth 2 (two) times daily. 08/17/21   [provider]  traZODone (DESYREL) 100 MG tablet Take 100-200 mg by mouth at bedtime.    [provider]      Allergies    Tramadol, Nsaids, Sulfamethoxazole-trimethoprim, and Ibuprofen    Review of Systems   Review of Systems  Physical Exam Updated Vital Signs BP 131/76 (BP Location: Left Arm)   Pulse 67   Temp 97.9 F (36.6 C) (Oral)   Resp 18   Ht 5\' 9"  (1.753 m)   Wt 127.9 kg   SpO2 100%   BMI 41.64 kg/m  Physical Exam Constitutional:      General: She is not in acute distress.    Appearance: She is obese.  HENT:     Head: Normocephalic and atraumatic.  Eyes:     Conjunctiva/sclera: Conjunctivae normal.     Pupils: Pupils are equal, round, and reactive to light.  Cardiovascular:     Rate and Rhythm: Normal rate and regular rhythm.  Pulmonary:     Effort: Pulmonary effort is normal. No respiratory distress.  Abdominal:     General: There is no distension.     Tenderness: There is no abdominal tenderness.  Skin:    General: Skin is warm and dry.     Comments: Excoriated erythematous region of suspected fungal infection under the  left abdominal pannus  Neurological:     General: No focal deficit present.     Mental Status: She is alert. Mental status is at baseline.  Psychiatric:        Mood and Affect: Mood normal.        Behavior: Behavior normal.     ED Results / Procedures / Treatments   Labs (all labs ordered are listed, but only abnormal results are displayed) Labs Reviewed  BASIC METABOLIC PANEL - Abnormal; Notable for the following components:      Result Value   Sodium 133 (*)  CO2 20 (*)    Glucose, Bld 254 (*)    BUN 24 (*)    Creatinine, Ser 1.16 (*)    GFR, Estimated 56 (*)    All other components within normal limits  CBC - Abnormal; Notable for the following components:   RBC 5.48 (*)    MCH 25.7 (*)    All other components within normal limits  HEPATIC FUNCTION PANEL - Abnormal; Notable for the following components:   AST 55 (*)    ALT 55 (*)    All other components within normal limits  D-DIMER, QUANTITATIVE  LIPASE, BLOOD  URINALYSIS, ROUTINE W REFLEX MICROSCOPIC  TROPONIN I (HIGH SENSITIVITY)  TROPONIN I (HIGH SENSITIVITY)    EKG EKG Interpretation  Date/Time:  Tuesday Jul 20 2022 16:37:54 EDT Ventricular Rate:  78 PR Interval:  171 QRS Duration: 84 QT Interval:  375 QTC Calculation: 428 R Axis:   97 Text Interpretation: Sinus rhythm Borderline right axis deviation Low voltage, precordial leads Probable anteroseptal infarct, old Confirmed by Alvester Chou [phone]) on 07/20/2022 4:42:56 PM  Radiology CT Renal Stone Study  Result Date: 07/20/2022 CLINICAL DATA:  Left flank pain EXAM: CT ABDOMEN AND PELVIS WITHOUT CONTRAST TECHNIQUE: Multidetector CT imaging of the abdomen and pelvis was performed following the standard protocol without IV contrast. RADIATION DOSE REDUCTION: This exam was performed according to the departmental dose-optimization program which includes automated exposure control, adjustment of the mA and/or kV according to patient size and/or use of  iterative reconstruction technique. COMPARISON:  CT abdomen and pelvis 12/05/2020 FINDINGS: Lower chest: No acute abnormality. Hepatobiliary: Liver is enlarged. There is fatty infiltration of the liver. Bile ducts and gallbladder are within normal limits. Pancreas: Unremarkable. No pancreatic ductal dilatation or surrounding inflammatory changes. Spleen: Mildly enlarged, increased in size from prior. Adrenals/Urinary Tract: Adrenal glands are unremarkable. Kidneys are normal, without renal calculi, focal lesion, or hydronephrosis. Bladder is distended. Stomach/Bowel: Stomach is within normal limits. No evidence of bowel wall thickening, distention, or inflammatory changes. There are few scattered colonic diverticula. The appendix is surgically absent. Vascular/Lymphatic: No significant vascular findings are present. No enlarged abdominal or pelvic lymph nodes. Reproductive: Uterus and bilateral adnexa are unremarkable. Other: No abdominal wall hernia or abnormality. No abdominopelvic ascites. Musculoskeletal: L4-5 posterior fusion hardware is present. IMPRESSION: 1. No acute localizing process in the abdomen or pelvis. 2. Hepatosplenomegaly. 3. Fatty infiltration of the liver. 4. Colonic diverticulosis. Electronically Signed   By: Darliss Cheney M.D.   On: 07/20/2022 21:21   DG Chest 2 View  Result Date: 07/20/2022 CLINICAL DATA:  Chest pain EXAM: CHEST - 2 VIEW COMPARISON:  05/06/2022 FINDINGS: The heart size and mediastinal contours are within normal limits. Both lungs are clear. The visualized skeletal structures are unremarkable. IMPRESSION: No active cardiopulmonary disease. Electronically Signed   By: Jasmine Pang M.D.   On: 07/20/2022 17:04    Procedures Procedures    Medications Ordered in ED Medications  HYDROmorphone (DILAUDID) injection 1 mg (1 mg Intravenous Given 07/20/22 1918)  sodium chloride 0.9 % bolus 1,000 mL (0 mLs Intravenous Stopped 07/20/22 2156)  nystatin (MYCOSTATIN/NYSTOP)  topical powder ( Topical Given 07/20/22 2233)  HYDROmorphone (DILAUDID) injection 0.5 mg (0.5 mg Intravenous Given 07/20/22 2233)    ED Course/ Medical Decision Making/ A&P Clinical Course as of 07/20/22 2238  Tue Jul 20, 2022  2200 Patient reassessed and workup has been reassuring. [MT]    Clinical Course User Index [MT] Asaiah Scarber, Kermit Balo, MD  Medical Decision Making Amount and/or Complexity of Data Reviewed Labs: ordered. Radiology: ordered.  Risk Prescription drug management.   This patient presents to the ED with concern for left-sided chest pain. This involves an extensive number of treatment options, and is a complaint that carries with it a high risk of complications and morbidity.  The differential diagnosis includes reflux gastritis versus gastric pneumonia versus pneumothorax versus pancreatitis versus intra-abdominal colitis or gas versus other  Co-morbidities that complicate the patient evaluation: High cholesterol, cardiovascular risk factors  Additional history obtained from patient's husband at the bedside  External records from outside source obtained and reviewed including [facility] CT PE study and CT abdomen pelvis study performed 1 month ago in April, with no acute PE, diffuse steatosis, unremarkable abdominal imaging otherwise.  For [facility] patient appears to have extensive evaluations for variety of complaints, including ED visits for headaches, chest pain, history of anxiety.  She has had extensive workup and CT imaging and other ED facilities and outpatient facilities including this year.  I ordered and personally interpreted labs.  The pertinent results include: D-dimer is negative.  Delta troponins negative.  No emergent findings on patient's labs.  I ordered imaging studies including x-ray of the chest, CT renal stone study I independently visualized and interpreted imaging which showed no emergent finding I  agree with the radiologist interpretation  The patient was maintained on a cardiac monitor.  I personally viewed and interpreted the cardiac monitored which showed an underlying rhythm of: Normal sinus rhythm  Per my interpretation the patient's ECG shows sinus rhythm without acute ischemic findings  I ordered medication including IV Dilaudid, for pain, nystatin powder for pannus fungal infection I have reviewed the patients home medicines and have made adjustments as needed  Test Considered: Much lower suspicion for acute pulmonary embolism with negative D-dimer, no hypoxia or tachycardia..  Low suspicion for aortic dissection.  After the interventions noted above, I reevaluated the patient and found that they have: improved  Overall this presentation is not consistent with ACS.  I would advise that she follow-up with her PCP, and return precautions were discussed with the patient and her husband present at the bedside.  She did seem reassured by her workup in the ED today.  Dispostion:  After consideration of the diagnostic results and the patients response to treatment, I feel that the patent would benefit from outpatient PCP follow-up         Final Clinical Impression(s) / ED Diagnoses Final diagnoses:  Yeast infection  Chest pain, unspecified type    Rx / DC Orders ED Discharge Orders          Ordered    nystatin (MYCOSTATIN/NYSTOP) powder  3 times daily        07/20/22 2201              Terald Sleeper, MD 07/20/22 2238

## 2022-07-20 NOTE — ED Notes (Signed)
MD made aware unable to obtain IV access at this time, attempted by 2 RNs; order for IV team placed

## 2022-07-22 ENCOUNTER — Ambulatory Visit (INDEPENDENT_AMBULATORY_CARE_PROVIDER_SITE_OTHER): Payer: BLUE CROSS/BLUE SHIELD | Admitting: Family Medicine

## 2022-07-22 ENCOUNTER — Encounter: Payer: Self-pay | Admitting: Family Medicine

## 2022-07-22 ENCOUNTER — Other Ambulatory Visit: Payer: Self-pay | Admitting: Family Medicine

## 2022-07-22 VITALS — BP 107/68 | HR 74 | Temp 97.8°F | Resp 18 | Ht 69.0 in | Wt 282.3 lb

## 2022-07-22 DIAGNOSIS — R61 Generalized hyperhidrosis: Secondary | ICD-10-CM | POA: Diagnosis not present

## 2022-07-22 DIAGNOSIS — R0789 Other chest pain: Secondary | ICD-10-CM | POA: Diagnosis not present

## 2022-07-22 DIAGNOSIS — K30 Functional dyspepsia: Secondary | ICD-10-CM

## 2022-07-22 DIAGNOSIS — Z124 Encounter for screening for malignant neoplasm of cervix: Secondary | ICD-10-CM

## 2022-07-22 DIAGNOSIS — Z1231 Encounter for screening mammogram for malignant neoplasm of breast: Secondary | ICD-10-CM

## 2022-07-22 DIAGNOSIS — L304 Erythema intertrigo: Secondary | ICD-10-CM

## 2022-07-22 MED ORDER — SUCRALFATE 1 G PO TABS
1.0000 g | ORAL_TABLET | Freq: Three times a day (TID) | ORAL | 0 refills | Status: DC
Start: 2022-07-22 — End: 2022-08-27

## 2022-07-22 MED ORDER — NYSTATIN 100000 UNIT/GM EX OINT
1.0000 | TOPICAL_OINTMENT | Freq: Two times a day (BID) | CUTANEOUS | 2 refills | Status: AC
Start: 2022-07-22 — End: ?

## 2022-07-22 MED ORDER — PANTOPRAZOLE SODIUM 40 MG PO TBEC
40.0000 mg | DELAYED_RELEASE_TABLET | Freq: Every day | ORAL | 3 refills | Status: DC
Start: 2022-07-22 — End: 2023-07-21

## 2022-07-22 MED ORDER — FLUCONAZOLE 150 MG PO TABS
150.0000 mg | ORAL_TABLET | Freq: Every day | ORAL | 1 refills | Status: DC
Start: 2022-07-22 — End: 2022-10-18

## 2022-07-22 NOTE — Progress Notes (Signed)
Established Patient Office Visit  Subjective   Patient ID: Jean Calderon, female    DOB: 01-12-1971  Age: 52 y.o. MRN: [id number]  Chief Complaint  Patient presents with   Follow-up    Patient is here today to follow up from an ER visit at [facility] ER she states that she is still having chest pain on the left side of chest under Breast, she states that the pain feels like a burning pain. She also has complaints of  "rashes that are popping up all over  and itch all of the time", Patient states that she feels bad in general.    HPI  Chest pain Pt reports on Tuesday May 21 she began to have chest pain. She took 1 Nitroglycerin while at work. She called EMS and they gave her another NTG with aspirin en route to ER. She had imaging done and ruled out blood clot along with negative troponins. Pt reports she has been taking Omeprazole OTC and it's not helping. She says she has a burning sensation in her chest and lower abdomen.   Intertrigo Pt reports rashes on her left leg and underneath her abdomen. She reports they are itchy.  Night sweats She has worsening night sweats. She would like to discuss with Gynecologist. She also needs pap smear. Pt is also due for mammogram and would like order sent.  Patient Active Problem List   Diagnosis Date Noted   Chronic midline low back pain 07/19/2022   History of lumbar fusion 07/19/2022   Chronic left SI joint pain 07/19/2022   Numbness and tingling of left leg 07/19/2022   Chronic pain syndrome 07/19/2022   Uncontrolled type 1 diabetes mellitus with hyperglycemia, with long-term current use of insulin (HCC) 06/23/2022   CKD stage G3b/A3, GFR 30-44 and albumin creatinine ratio >300 mg/g (HCC) 06/06/2022   Transaminitis 06/06/2022   Acute pancreatitis 06/06/2022   Ulnar neuropathy of both upper extremities 12/23/2021   Hyperlipidemia 10/01/2021   Diabetic neuropathy (HCC) 09/30/2021   Meralgia paresthetica, right lower limb 09/30/2021    Gait instability 07/30/2021   Migraine without status migrainosus, not intractable 07/30/2021   Poorly controlled type 2 diabetes mellitus with peripheral neuropathy (HCC) 06/05/2021   Menopausal symptoms 04/15/2021   Anxiety 03/25/2021   CAD (coronary artery disease)    HTN (hypertension)    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   Heart valve disease 01/06/2021   Vitamin B12 deficiency 12/03/2020   Vitamin D deficiency 12/03/2020   Chronic left shoulder pain 11/25/2020   Generalized anxiety disorder 11/25/2020   Psoriasis 01/01/2020   Lumbar radiculopathy 06/21/2019   Uterine leiomyoma 03/06/2019   Depression with anxiety 02/02/2018   Primary insomnia 02/02/2018   Class 2 severe obesity due to excess calories with serious comorbidity and body mass index (BMI) of 38.0 to 38.9 in adult (HCC) 10/12/2017   Insulin pump in place 10/12/2017   Cervical radiculopathy 08/15/2017   Family history of early CAD 07/21/2017   Obstructive sleep apnea syndrome 07/21/2017   Chronic right shoulder pain 05/30/2017   Type 2 diabetes mellitus with mild nonproliferative diabetic retinopathy without macular edema, bilateral (HCC) 03/18/2017   Congestive heart failure (HCC) 03/16/2017   Other cervical disc degeneration, unspecified cervical region 11/11/2016   Adhesive capsulitis of right shoulder 10/08/2016   GERD (gastroesophageal reflux disease) 07/19/2016   Spinal stenosis of cervical region 12/01/2015   Cervical disc herniation 12/01/2015   Bilateral carpal tunnel syndrome 11/04/2015   Lesion of ulnar  nerve, left upper limb 11/04/2015   Stented coronary artery 07/07/2015     Review of Systems  Constitutional:        Night sweats  Gastrointestinal:  Positive for heartburn. Negative for abdominal pain, blood in stool, constipation, melena, nausea and vomiting.  Skin:  Positive for itching and rash.  All other systems reviewed and are negative.    Objective:     BP 107/68   Pulse 74    Temp 97.8 F (36.6 C) (Oral)   Resp 18   Ht 5\' 9"  (1.753 m)   Wt 282 lb 4.8 oz (128.1 kg)   SpO2 96%   BMI 41.69 kg/m    Physical Exam Vitals and nursing note reviewed.  Constitutional:      Appearance: Normal appearance. She is obese.  HENT:     Head: Normocephalic and atraumatic.     Right Ear: External ear normal.     Left Ear: External ear normal.     Nose: Nose normal.  Eyes:     Pupils: Pupils are equal, round, and reactive to light.  Cardiovascular:     Rate and Rhythm: Normal rate.  Pulmonary:     Effort: Pulmonary effort is normal.  Neurological:     General: No focal deficit present.     Mental Status: She is alert and oriented to person, place, and time. Mental status is at baseline.  Psychiatric:        Mood and Affect: Mood normal.        Behavior: Behavior normal.        Thought Content: Thought content normal.        Judgment: Judgment normal.    No results found for any visits on 07/22/22.    The 10-year ASCVD risk score (Arnett DK, et al., 2019) is: 7.7%    Assessment & Plan:   Problem List Items Addressed This Visit   None  Midsternal chest pain -     Ambulatory referral to Gastroenterology -     Pantoprazole Sodium; Take 1 tablet (40 mg total) by mouth daily.  Dispense: 30 tablet; Refill: 3 -     Sucralfate; Take 1 tablet (1 g total) by mouth 4 (four) times daily -  with meals and at bedtime.  Dispense: 120 tablet; Refill: 0  Acid indigestion -     Ambulatory referral to Gastroenterology -     Pantoprazole Sodium; Take 1 tablet (40 mg total) by mouth daily.  Dispense: 30 tablet; Refill: 3 -     Sucralfate; Take 1 tablet (1 g total) by mouth 4 (four) times daily -  with meals and at bedtime.  Dispense: 120 tablet; Refill: 0  Intertrigo -     Nystatin; Apply 1 Application topically 2 (two) times daily.  Dispense: 30 g; Refill: 2 -     Fluconazole; Take 1 tablet (150 mg total) by mouth daily.  Dispense: 1 tablet; Refill: 1  Screening for  cervical cancer -     Ambulatory referral to Obstetrics / Gynecology  Night sweats -     Ambulatory referral to Obstetrics / Gynecology  Screening mammogram for breast cancer -     3D Screening Mammogram, Left and Right; Future   To refer to GI for further management of midsternal chest pain with acid indigestion. Sent in Protonix 40mg  daily along with Carafate 1 gm 4 times a day.  Rash underneath abdomen likely intertrigo. She does have hx of uncontrolled diabetes which this condition  can occur with uncontrolled sugar. Sent diflucan 150mg  x 1 along with nystatin ointment Refer to Gynecology for management of  night sweats along with pap smear Refer for mammogram  No follow-ups on file.    Suzan Slick, MD

## 2022-07-27 ENCOUNTER — Telehealth: Payer: Self-pay | Admitting: Family Medicine

## 2022-07-27 NOTE — Telephone Encounter (Signed)
Patient called in to request an update on the status of her prior authorization for  pantoprazole (PROTONIX) 40 MG tablet, please advise. Katha Hamming

## 2022-07-28 NOTE — Telephone Encounter (Signed)
Called patient to inform her that prior authorization was still being worked on and that Dr. Wyline Mood advised that she could take the Carafate that was prescribed until other medication was ready . Patient voiced a verbal understanding

## 2022-07-30 ENCOUNTER — Telehealth: Payer: Self-pay

## 2022-07-30 NOTE — Telephone Encounter (Addendum)
Initiated Prior authorization WGN:[id number] Sodium 40MG  dr tablets Via: Covermymeds Case/Key:[id number] Status: approved as of 07/30/22 Reason:Authorization Expiration Date: 08/02/2023  Notified Pt via: Mychart  [employer] medicaid is not primary

## 2022-08-03 NOTE — Therapy (Deleted)
OUTPATIENT PHYSICAL THERAPY THORACOLUMBAR EVALUATION   Patient Name: Jean Calderon MRN: [id number] DOB:[birth year], 52 y.o., female Today's Date: 08/03/2022  END OF SESSION:   Past Medical History:  Diagnosis Date   CAD (coronary artery disease)    Cervical radiculopathy 08/15/2017   CKD (chronic kidney disease), stage II    COVID-19 01/2022   DM (diabetes mellitus) (HCC)    HTN (hypertension)    Lumbar radiculopathy 06/21/2019   Last Assessment & Plan: Formatting of this note might be different from the original. Scheduled for laminectomy L5 4/27 Pre-op complete at hospital; labs and ekg reviewed. Scheduled for cardiology eval in one day Medical cleared for procedure by IM evaluation.   Malignant hyperthermia    Myocardial infarction [facility])    Neuropathy    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   1. Severe tendinosis of the supraspinatus tendon.  2. Mild tendinosis of the infraspinatus tendon.  3. Thickening of the inferior joint capsule and intermediate signal  material effacing the normal subcoracoid fat as can be seen with  adhesive capsulitis.    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   Past Surgical History:  Procedure Laterality Date   APPENDECTOMY     BACK SURGERY     CESAREAN SECTION     CORONARY ANGIOPLASTY WITH STENT PLACEMENT     MULTIPLE TOOTH EXTRACTIONS     ROTATOR CUFF REPAIR     Patient Active Problem List   Diagnosis Date Noted   Chronic midline low back pain 07/19/2022   History of lumbar fusion 07/19/2022   Chronic left SI joint pain 07/19/2022   Numbness and tingling of left leg 07/19/2022   Chronic pain syndrome 07/19/2022   Uncontrolled type 1 diabetes mellitus with hyperglycemia, with long-term current use of insulin (HCC) 06/23/2022   CKD stage G3b/A3, GFR 30-44 and albumin creatinine ratio >300 mg/g (HCC) 06/06/2022   Transaminitis 06/06/2022   Acute  pancreatitis 06/06/2022   Ulnar neuropathy of both upper extremities 12/23/2021   Hyperlipidemia 10/01/2021   Diabetic neuropathy (HCC) 09/30/2021   Meralgia paresthetica, right lower limb 09/30/2021   Gait instability 07/30/2021   Migraine without status migrainosus, not intractable 07/30/2021   Poorly controlled type 2 diabetes mellitus with peripheral neuropathy (HCC) 06/05/2021   Menopausal symptoms 04/15/2021   Anxiety 03/25/2021   CAD (coronary artery disease)    HTN (hypertension)    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   Heart valve disease 01/06/2021   Vitamin B12 deficiency 12/03/2020   Vitamin D deficiency 12/03/2020   Chronic left shoulder pain 11/25/2020   Generalized anxiety disorder 11/25/2020   Psoriasis 01/01/2020   Lumbar radiculopathy 06/21/2019   Uterine leiomyoma 03/06/2019   Depression with anxiety 02/02/2018   Primary insomnia 02/02/2018   Class 2 severe obesity due to excess calories with serious comorbidity and body mass index (BMI) of 38.0 to 38.9 in adult (HCC) 10/12/2017   Insulin pump in place 10/12/2017   Cervical radiculopathy 08/15/2017   Family history of early CAD 07/21/2017   Obstructive sleep apnea syndrome 07/21/2017   Chronic right shoulder pain 05/30/2017   Type 2 diabetes mellitus with mild nonproliferative diabetic retinopathy without macular edema, bilateral (HCC) 03/18/2017   Congestive heart failure (HCC) 03/16/2017   Other cervical disc degeneration, unspecified cervical region 11/11/2016   Adhesive capsulitis of right shoulder 10/08/2016   GERD (gastroesophageal reflux disease) 07/19/2016   Spinal stenosis of cervical region 12/01/2015   Cervical  disc herniation 12/01/2015   Bilateral carpal tunnel syndrome 11/04/2015   Lesion of ulnar nerve, left upper limb 11/04/2015   Stented coronary artery 07/07/2015    PCP: Dr Suzan Slick  REFERRING PROVIDER: Dr Angelina Sheriff  REFERRING DIAG: Chronic low back pain; hx of  lumbar fusion; Lt SI pain   Rationale for Evaluation and Treatment: Rehabilitation  THERAPY DIAG:  No diagnosis found.  ONSET DATE: ***  SUBJECTIVE:                                                                                                                                                                                           SUBJECTIVE STATEMENT: ***  PERTINENT HISTORY:  ***  PAIN:  Are you having pain? Yes: NPRS scale: ***/10 Pain location: *** Pain description: *** Aggravating factors: *** Relieving factors: ***  PRECAUTIONS: None  WEIGHT BEARING RESTRICTIONS: No  FALLS:  Has patient fallen in last 6 months? No  LIVING ENVIRONMENT: Lives with: lives with their spouse Lives in: House/apartment Stairs: {opstairs:27293} Has following equipment at home: {Assistive devices:23999}  OCCUPATION: ***  PLOF: Independent  PATIENT GOALS: ***  NEXT MD VISIT: 08/23/22  OBJECTIVE:   DIAGNOSTIC FINDINGS:  Xray 05/06/22: 1. No fracture or subluxation of the lumbar spine. 2. Posterior lumbar fusion hardware at L4-L5 with mild degenerative disc disease at this level. Intact hardware.  PATIENT SURVEYS:  FOTO   SCREENING FOR RED FLAGS: Bowel or bladder incontinence: {Yes/No:304960894} Spinal tumors: {Yes/No:304960894} Cauda equina syndrome: {Yes/No:304960894} Compression fracture: {Yes/No:304960894} Abdominal aneurysm: {Yes/No:304960894}  COGNITION: Overall cognitive status: Within functional limits for tasks assessed     SENSATION: {sensation:27233}  MUSCLE LENGTH: Hamstrings: Right *** deg; Left *** deg Maisie Fus test: Right *** deg; Left *** deg  POSTURE: {posture:25561}  PALPATION: ***  LUMBAR ROM:   AROM eval  Flexion   Extension   Right lateral flexion   Left lateral flexion   Right rotation   Left rotation    (Blank rows = not tested)  LOWER EXTREMITY ROM:     Active  Right eval Left eval  Hip flexion    Hip extension    Hip  abduction    Hip adduction    Hip internal rotation    Hip external rotation    Knee flexion    Knee extension    Ankle dorsiflexion    Ankle plantarflexion    Ankle inversion    Ankle eversion     (Blank rows = not tested)  LOWER EXTREMITY MMT:    MMT Right eval Left eval  Hip flexion    Hip extension    Hip  abduction    Hip adduction    Hip internal rotation    Hip external rotation    Knee flexion    Knee extension    Ankle dorsiflexion    Ankle plantarflexion    Ankle inversion    Ankle eversion     (Blank rows = not tested)  LUMBAR SPECIAL TESTS:  Straight leg raise test: {pos/neg:25243} and Slump test: {pos/neg:25243}  FUNCTIONAL TESTS:  5 times sit to stand: ***  GAIT: Distance walked: 40 Assistive device utilized: {Assistive devices:23999} Level of assistance: {Levels of assistance:24026} Comments: ***  [facility] Adult PT Treatment:                                                DATE: 08/04/22 Therapeutic Exercise: *** Manual Therapy: *** Neuromuscular re-ed: *** Therapeutic Activity: *** Gait: *** Modalities: *** Self Care: ***    PATIENT EDUCATION:  Education details: POC; HEP  Person educated: Patient Education method: Explanation, Demonstration, Tactile cues, Verbal cues, and Handouts Education comprehension: verbalized understanding, returned demonstration, verbal cues required, tactile cues required, and needs further education  HOME EXERCISE PROGRAM: ***  ASSESSMENT:  CLINICAL IMPRESSION: Patient is a 52 y.o. female who was seen today for physical therapy evaluation and treatment for chronic LBP with history of lumbar fusion; Lt SI pain.   OBJECTIVE IMPAIRMENTS: {opptimpairments:25111}.   ACTIVITY LIMITATIONS: {activitylimitations:27494}  PARTICIPATION LIMITATIONS: {participationrestrictions:25113}  PERSONAL FACTORS: {Personal factors:25162} are also affecting patient's functional outcome.   REHAB POTENTIAL: Good  CLINICAL  DECISION MAKING: Stable/uncomplicated  EVALUATION COMPLEXITY: Low   GOALS: Goals reviewed with patient? Yes  SHORT TERM GOALS: Target date: ***  Independent in initial HEP  Baseline: Goal status: INITIAL  2.  *** Baseline:  Goal status: INITIAL    LONG TERM GOALS: Target date: ***  *** Baseline:  Goal status: INITIAL  2.  *** Baseline:  Goal status: INITIAL  3.  *** Baseline:  Goal status: INITIAL  4.  *** Baseline:  Goal status: INITIAL  5.  *** Baseline:  Goal status: INITIAL  6.  *** Baseline:  Goal status: INITIAL  PLAN:  PT FREQUENCY: 2x/week  PT DURATION: 12 weeks  PLANNED INTERVENTIONS: Therapeutic exercises, Therapeutic activity, Neuromuscular re-education, Balance training, Gait training, Patient/Family education, Self Care, Joint mobilization, Stair training, Aquatic Therapy, Dry Needling, Electrical stimulation, Spinal mobilization, Cryotherapy, Moist heat, Taping, Ultrasound, Ionotophoresis 4mg /ml Dexamethasone, Manual therapy, and Re-evaluation.  PLAN FOR NEXT SESSION: review and progress with HEP; continue back care education; manual work, DN, modalities as indicated    Breckin Zafar Rober Minion, PT 08/03/2022, 5:32 PM

## 2022-08-04 ENCOUNTER — Ambulatory Visit: Payer: BLUE CROSS/BLUE SHIELD | Admitting: Rehabilitative and Restorative Service Providers"

## 2022-08-05 ENCOUNTER — Encounter: Payer: Self-pay | Admitting: Family Medicine

## 2022-08-05 ENCOUNTER — Other Ambulatory Visit: Payer: Self-pay | Admitting: Family Medicine

## 2022-08-05 ENCOUNTER — Telehealth: Payer: Self-pay | Admitting: Family Medicine

## 2022-08-05 MED ORDER — ESTRADIOL-NORETHINDRONE ACET 0.5-0.1 MG PO TABS: 1.0000 | ORAL_TABLET | Freq: Every day | ORAL | 1 refills | Status: DC

## 2022-08-05 NOTE — Telephone Encounter (Signed)
Please confirm the estrogen pill she's referring to? I'm showing a recent refill of Estradiol done by Dr Bradly Chris on 5/11 with 1 refill.  Estradiol-Norethindrone Acet 0.5-0.1 MG tablet On Chart Dose: 1 tablet Take 1 tablet by mouth daily. 12/18/2020  Local Medical Record      Updated on: 07/22/2022   Months of dispense information: 5; Number of dispenses: 5 Dispense date: 07/10/2022 Qty: 28 each Pharmacy: [id number] - [place], [place] - [address] AT [facility] OF [address] [phone]

## 2022-08-05 NOTE — Telephone Encounter (Signed)
Refilled requested medicine. Can we update pt on PA on Protonix and referrals.

## 2022-08-05 NOTE — Telephone Encounter (Signed)
Patient calling to request refill of estradome (estrogen pill?). She states that she takes her last one tonight and she has not been contacted to schedule for an OBGYN yet.  States would like sent to [facility] in [place].  Patient is also inquiring about the status of her pending prior authorization. Patient states it has been two weeks and she has heard nothing.   Patient states she has not heard back from GI or Women's Health Referrals. Patient would like an update if possible.   Please advise. Katha Hamming

## 2022-08-05 NOTE — Addendum Note (Signed)
Addended by: Suzan Slick on: 08/05/2022 01:25 PM   Modules accepted: Orders

## 2022-08-09 NOTE — Telephone Encounter (Signed)
All referrals were faxed on 07/29/2022. Patient has been sent letters through [facility] with referral contact info to schedule an appointment. Most referrals are averaging about 2 weeks for the speciality office to contact patient to schedule appointment.

## 2022-08-10 ENCOUNTER — Other Ambulatory Visit: Payer: Self-pay | Admitting: Family Medicine

## 2022-08-10 DIAGNOSIS — I509 Heart failure, unspecified: Secondary | ICD-10-CM

## 2022-08-10 DIAGNOSIS — F418 Other specified anxiety disorders: Secondary | ICD-10-CM

## 2022-08-10 NOTE — Telephone Encounter (Signed)
I have filled her Imdur but denied her Venlafaxine (Effexor). It looks like this was discontinued by another provider as Dr Shearon Stalls, her pain management provider started her on the Duloxetine(Cymbalta) instead on 07/19/22 for pain.

## 2022-08-12 ENCOUNTER — Ambulatory Visit: Payer: BLUE CROSS/BLUE SHIELD

## 2022-08-13 ENCOUNTER — Encounter: Payer: BLUE CROSS/BLUE SHIELD | Admitting: Physical Medicine & Rehabilitation

## 2022-08-13 ENCOUNTER — Encounter: Payer: Self-pay | Admitting: Podiatry

## 2022-08-13 ENCOUNTER — Ambulatory Visit (INDEPENDENT_AMBULATORY_CARE_PROVIDER_SITE_OTHER): Payer: Commercial Managed Care - PPO | Admitting: Podiatry

## 2022-08-13 DIAGNOSIS — M79671 Pain in right foot: Secondary | ICD-10-CM

## 2022-08-13 DIAGNOSIS — E1149 Type 2 diabetes mellitus with other diabetic neurological complication: Secondary | ICD-10-CM | POA: Diagnosis not present

## 2022-08-13 DIAGNOSIS — B351 Tinea unguium: Secondary | ICD-10-CM

## 2022-08-13 DIAGNOSIS — M79672 Pain in left foot: Secondary | ICD-10-CM

## 2022-08-13 NOTE — Progress Notes (Signed)
  Subjective:  Patient ID: Jean Calderon, female    DOB: [birth year],   MRN: [id number]  Chief Complaint  Patient presents with   Nail Problem     Routine foot care    52 y.o. female presents for concern of thickened elongated and painful nails that are difficult to trim. Requesting to have them trimmed today. Relates burning and tingling in their feet. Patient is diabetic and last A1c was  Lab Results  Component Value Date   HGBA1C 14.0 (A) 05/06/2022   .   PCP:  Suzan Slick, MD    . Denies any other pedal complaints. Denies n/v/f/c.   Past Medical History:  Diagnosis Date   CAD (coronary artery disease)    Cervical radiculopathy 08/15/2017   CKD (chronic kidney disease), stage II    COVID-19 01/2022   DM (diabetes mellitus) (HCC)    HTN (hypertension)    Lumbar radiculopathy 06/21/2019   Last Assessment & Plan: Formatting of this note might be different from the original. Scheduled for laminectomy L5 4/27 Pre-op complete at hospital; labs and ekg reviewed. Scheduled for cardiology eval in one day Medical cleared for procedure by IM evaluation.   Malignant hyperthermia    Myocardial infarction [facility])    Neuropathy    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   1. Severe tendinosis of the supraspinatus tendon.  2. Mild tendinosis of the infraspinatus tendon.  3. Thickening of the inferior joint capsule and intermediate signal  material effacing the normal subcoracoid fat as can be seen with  adhesive capsulitis.    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    Objective:  Physical Exam: Vascular: DP/PT pulses 2/4 bilateral. CFT <3 seconds. Absent hair growth on digits. Edema noted to bilateral lower extremities. Xerosis noted bilaterally.  Skin. No lacerations or abrasions bilateral feet. Nails 1-5 bilateral  are thickened discolored and elongated with subungual debris.   Musculoskeletal: MMT 5/5 bilateral lower extremities in DF, PF, Inversion and Eversion. Deceased ROM in DF of ankle joint.  Neurological: Sensation intact to light touch. Protective sensation diminished bilateral.    Assessment:   1. Dermatophytosis of nail   2. Pain in both feet   3. Type II diabetes mellitus with neurological manifestations (HCC)       Plan:  Patient was evaluated and treated and all questions answered. -Discussed and educated patient on diabetic foot care, especially with  regards to the vascular, neurological and musculoskeletal systems.  -Stressed the importance of good glycemic control and the detriment of not  controlling glucose levels in relation to the foot. -Discussed supportive shoes at all times and checking feet regularly.  -Mechanically debrided all nails 1-5 bilateral using sterile nail nipper and filed with dremel without incident  -Patient will be following up with neurologist soon and will be looking into spinal nerve stimulator.  -Answered all patient questions -Patient to return  in 3 months for at risk foot care -Patient advised to call the office if any problems or questions arise in the meantime.   Louann Sjogren, DPM

## 2022-08-19 ENCOUNTER — Other Ambulatory Visit: Payer: Self-pay | Admitting: Family Medicine

## 2022-08-19 ENCOUNTER — Telehealth: Payer: Self-pay | Admitting: Family Medicine

## 2022-08-19 MED ORDER — LISINOPRIL-HYDROCHLOROTHIAZIDE 20-12.5 MG PO TABS: 1.0000 | ORAL_TABLET | Freq: Every day | ORAL | 0 refills | Status: DC

## 2022-08-19 NOTE — Telephone Encounter (Signed)
Patient calling to inquire about status of [facility] and Gastro referral.States she has not heard back yet and would like to schedule ASAP. Patient requesting call from referral coordinator if possible.  Patient states she is also out of lisinopril-hydrochlorothiazide (ZESTORETIC) 20-12.5 MG tablet and would like that refilled for one month to the [facility] in [place] on file. Please advise. Katha Hamming

## 2022-08-20 ENCOUNTER — Telehealth: Payer: Self-pay | Admitting: Gastroenterology

## 2022-08-20 NOTE — Telephone Encounter (Signed)
Called pt to inquire about the conversation. She stated she would find her own doctors for the referrals . She stated she wasn't rude to nurse calling. She is currently at work and was unable to talk.

## 2022-08-20 NOTE — Telephone Encounter (Signed)
Spoke with patient regarding message to inform her regarding her refill and referral, Patient was very rude and states  "It's been 3 weeks, and what I need to do is tell her ( Dr. Wyline Mood) is that she will find her own "Doctors" and she "WILL" send the referral to where she tells her to send them",  I told patient I would relay message to the provider and ended the call.

## 2022-08-20 NOTE — Telephone Encounter (Signed)
Good Afternoon Dr Tomasa Rand,  Supervising MD 6/21 PM  We have received a referral for this patient to be seen in our office for mid sternal chest pain and indigestion from Dr Wyline Mood. Patient has previous history with [facility] in [place], [place], wishing to establish care with [place] since recently moving to the area. Records are available to view in Epic, please review at your earliest convenience and advise on scheduling.   Thank You!

## 2022-08-20 NOTE — Telephone Encounter (Signed)
Called Jarvis Newcomer ( Spoke with Kaitlin)-Supervising physician will need to review patients chart since pt was seen elsewhere within 3 years before patient can be scheduled. Once approved to schedule, Waldo Laine will contact patient directly.  Patient has been scheduled for 09/22/2022 at 9:50 am with Dr. Para March. Appointment will be located at   [facility] [address] [place], [place] [address]  Ph:[phone]

## 2022-08-20 NOTE — Telephone Encounter (Signed)
Spoke with patient and updated her on referral information below;  Called Hotel manager ( Spoke with Kaitlin)-Supervising physician will need to review patients chart since pt was seen elsewhere within 3 years before patient can be scheduled. Once approved to schedule, Waldo Laine will contact patient directly.   Patient has been scheduled for 09/22/2022 at 9:50 am with Dr. Para March. Appointment will be located at    [facility] [address] [place], [place] [address]   Ph:[phone]

## 2022-08-23 ENCOUNTER — Encounter
Payer: BLUE CROSS/BLUE SHIELD | Attending: Physical Medicine and Rehabilitation | Admitting: Physical Medicine and Rehabilitation

## 2022-08-23 VITALS — BP 91/59 | HR 85 | Ht 69.0 in | Wt 277.0 lb

## 2022-08-23 DIAGNOSIS — M545 Low back pain, unspecified: Secondary | ICD-10-CM

## 2022-08-23 DIAGNOSIS — G894 Chronic pain syndrome: Secondary | ICD-10-CM | POA: Diagnosis present

## 2022-08-23 DIAGNOSIS — Z794 Long term (current) use of insulin: Secondary | ICD-10-CM

## 2022-08-23 DIAGNOSIS — G8929 Other chronic pain: Secondary | ICD-10-CM | POA: Diagnosis present

## 2022-08-23 DIAGNOSIS — E1142 Type 2 diabetes mellitus with diabetic polyneuropathy: Secondary | ICD-10-CM

## 2022-08-23 MED ORDER — CAPSAICIN-CLEANSING GEL 8 % EX KIT
4.0000 | PACK | Freq: Once | CUTANEOUS | Status: AC
Start: 2022-08-23 — End: 2022-08-23
  Administered 2022-08-23: 4 via TOPICAL

## 2022-08-23 MED ORDER — AMITRIPTYLINE HCL 25 MG PO TABS: 25.0000 mg | ORAL_TABLET | Freq: Every day | ORAL | 3 refills | Status: DC

## 2022-08-23 NOTE — Progress Notes (Signed)
HPI: Jean Calderon is a 52 y.o. female with PMHx has CAD (coronary artery disease); HTN (hypertension); Adhesive capsulitis of right shoulder; Anxiety; Bilateral carpal tunnel syndrome; Cervical radiculopathy; Spinal stenosis of cervical region; Lumbar radiculopathy; Chronic left shoulder pain; Chronic right shoulder pain; Class 2 severe obesity due to excess calories with serious comorbidity and body mass index (BMI) of 38.0 to 38.9 in adult [facility]); Congestive heart failure (HCC); Diabetic neuropathy (HCC); Family history of early CAD; Gait instability; Depression with anxiety; Generalized anxiety disorder; GERD (gastroesophageal reflux disease); Heart valve disease; Hyperlipidemia; Insulin pump in place; Stented coronary artery; Menopausal symptoms; Meralgia paresthetica, right lower limb; Migraine without status migrainosus, not intractable; Obstructive sleep apnea syndrome; Osteoarthritis of left shoulder; Primary insomnia; Psoriasis; Ulnar neuropathy of both upper extremities; Uterine leiomyoma; Vitamin B12 deficiency; Vitamin D deficiency; Poorly controlled type 2 diabetes mellitus with peripheral neuropathy (HCC); Type 2 diabetes mellitus with mild nonproliferative diabetic retinopathy without macular edema, bilateral (HCC); Other cervical disc degeneration, unspecified cervical region; Lesion of ulnar nerve, left upper limb; Cervical disc herniation; CKD stage G3b/A3, GFR 30-44 and albumin creatinine ratio >300 mg/g (HCC); Uncontrolled type 1 diabetes mellitus with hyperglycemia, with long-term current use of insulin (HCC); Transaminitis; Acute pancreatitis; Chronic midline low back pain; History of lumbar fusion; Chronic left SI joint pain; Numbness and tingling of left leg; and Chronic pain syndrome on their problem list. who presents to clinic for treatment of pain related to peripheral neuropathy with qutenza patches as described below.    No new concerns or complaints.  She is having carpal tunnel  surgery and ulnar nerve release in her left arm on 6/31.  She states she is on duloxetine 2 tabs and it does not work. She is already on lyrica 400 mg daily.  Asking for corset brace for back pain; states if she is standing for too long her back bothers her. She would only need it for activities where she is on her feet 1-2x weekly.   Physical Exam:  General: Appropriate appearance for age.  Mental Status: Appropriate mood and affect.  Cardiovascular: RRR, no m/r/g.  Respiratory: CTAB, no rales/rhonchi/wheezing.  Skin: No apparent rashes or lesions.  Neuro: Awake, alert, and oriented x3. Sensory loss in stocking pattern in bilateral lower extremities.  MSK:  Moving all 4 limbs antigravity and against resistance.   PROCEDURE:  Qutenza Patch application Diagnosis:    ICD-10-CM   1. Diabetic polyneuropathy associated with type 2 diabetes mellitus (HCC)  E11.42     2. Chronic midline low back pain, unspecified whether sciatica present  M54.50    G89.29     3. Chronic pain syndrome  G89.4       Goals with treatment: [ x ] Decrease pain [  ] Improve Active / Passive ROM [ x ] Improve ADLs [ x ] Improve functional mobility  MEDICATION:  [ x ] Qutenza 8% topical capsaicin - 4 patches   CONSENT: Obtained in writing per policy. Consent uploaded to chart.  Benefits discussed.  Risks discussed included, but were not limited to, pain and discomfort, bleeding, bruising, allergic reaction, infection. All questions answered to patient/family member/guardian/ caregiver satisfaction. They would like to proceed with procedure. There are no noted contraindications to procedure.  PROCEDURE Time out was preformed No heat sources No antibiotics  The patient was explained about both the benefits and risks of a capsaicin 8% patch application. After the patient acknowledged an understanding of the risks and benefits, the patient agreed to proceed.  2 patches of Qutenza were applied to the area of  pain in each mid-shin and foot.  Blood pressure was monitored every 15 minutes. The patient tolerated the procedure well for  30 minutes until patches were removed and post-patch cream was applies.   No complications were encountered. The patient tolerated the procedure well.  Impression: HPI: Jean Calderon is a 52 y.o. female with PMHx has CAD (coronary artery disease); HTN (hypertension); Adhesive capsulitis of right shoulder; Anxiety; Bilateral carpal tunnel syndrome; Cervical radiculopathy; Spinal stenosis of cervical region; Lumbar radiculopathy; Chronic left shoulder pain; Chronic right shoulder pain; Class 2 severe obesity due to excess calories with serious comorbidity and body mass index (BMI) of 38.0 to 38.9 in adult [facility]); Congestive heart failure (HCC); Diabetic neuropathy (HCC); Family history of early CAD; Gait instability; Depression with anxiety; Generalized anxiety disorder; GERD (gastroesophageal reflux disease); Heart valve disease; Hyperlipidemia; Insulin pump in place; Stented coronary artery; Menopausal symptoms; Meralgia paresthetica, right lower limb; Migraine without status migrainosus, not intractable; Obstructive sleep apnea syndrome; Osteoarthritis of left shoulder; Primary insomnia; Psoriasis; Ulnar neuropathy of both upper extremities; Uterine leiomyoma; Vitamin B12 deficiency; Vitamin D deficiency; Poorly controlled type 2 diabetes mellitus with peripheral neuropathy (HCC); Type 2 diabetes mellitus with mild nonproliferative diabetic retinopathy without macular edema, bilateral (HCC); Other cervical disc degeneration, unspecified cervical region; Lesion of ulnar nerve, left upper limb; Cervical disc herniation; CKD stage G3b/A3, GFR 30-44 and albumin creatinine ratio >300 mg/g (HCC); Uncontrolled type 1 diabetes mellitus with hyperglycemia, with long-term current use of insulin (HCC); Transaminitis; Acute pancreatitis; Chronic midline low back pain; History of lumbar fusion;  Chronic left SI joint pain; Numbness and tingling of left leg; and Chronic pain syndrome on their problem list. who presents to clinic for treatment of pain related to peripheral neuropathy with qutenza patches as described below.   No new concerns or complaints. No major changes in medical history since last visit.   PLAN: - Resume Usual Activities. Notify Physician of any unusual bleeding, erythema or concern for side effects as reviewed above. - Apply ice prn for pain - Tylenol prn for pain - Follow up in August as scheduled - Decrease Duloxetine to 1 capsule nightly for one week, then stop - Once Duloxetine is stopped, start Elavil 1 tablet at nighttime for 2-3 days, then increase to 2 tab at nighttime.   Patient/Care Jean Calderon was ready to learn without apparent learning barriers. Education was provided on diagnosis, treatment options/plan according to patient's preferred learning style. Patient/Care Giver verbalized understanding and agreement with the above plan.   Angelina Sheriff, DO 08/23/2022

## 2022-08-23 NOTE — Patient Instructions (Addendum)
-   Resume Usual Activities. Notify Physician of any unusual bleeding, erythema or concern for side effects as reviewed above. - Apply ice prn for pain - Tylenol prn for pain - Follow up in August as scheduled - Decrease Duloxetine to 1 capsule nightly for one week, then stop - Once Duloxetine is stopped, start Elavil 1 tablet at nighttime for 2-3 days, then increase to 2 tab at nighttime.

## 2022-08-26 ENCOUNTER — Encounter: Payer: Self-pay | Admitting: Gastroenterology

## 2022-08-26 ENCOUNTER — Ambulatory Visit: Payer: BLUE CROSS/BLUE SHIELD

## 2022-08-27 ENCOUNTER — Ambulatory Visit (INDEPENDENT_AMBULATORY_CARE_PROVIDER_SITE_OTHER): Payer: Commercial Managed Care - PPO | Admitting: Podiatry

## 2022-08-27 ENCOUNTER — Other Ambulatory Visit: Payer: Self-pay | Admitting: Family Medicine

## 2022-08-27 DIAGNOSIS — R0789 Other chest pain: Secondary | ICD-10-CM

## 2022-08-27 DIAGNOSIS — Z91199 Patient's noncompliance with other medical treatment and regimen due to unspecified reason: Secondary | ICD-10-CM

## 2022-08-27 DIAGNOSIS — K30 Functional dyspepsia: Secondary | ICD-10-CM

## 2022-08-27 NOTE — Progress Notes (Signed)
No show

## 2022-09-05 ENCOUNTER — Other Ambulatory Visit: Payer: Self-pay | Admitting: Family Medicine

## 2022-09-05 DIAGNOSIS — E1142 Type 2 diabetes mellitus with diabetic polyneuropathy: Secondary | ICD-10-CM

## 2022-09-05 DIAGNOSIS — M5416 Radiculopathy, lumbar region: Secondary | ICD-10-CM

## 2022-09-06 ENCOUNTER — Telehealth: Payer: Self-pay | Admitting: Family Medicine

## 2022-09-06 ENCOUNTER — Other Ambulatory Visit: Payer: Self-pay | Admitting: Family Medicine

## 2022-09-06 NOTE — Telephone Encounter (Signed)
Please inform pt I've already filled her lyrica for today as a request came in. As far as the Sertraline, pt reported not taking this back in December 2023 when she established care with me. She was taking the Effexor and Trazodone. Did she mean she needed the Effexor refilled? I've also referred her to mental health provider for further treatment this year. Has she rescheduled with them?

## 2022-09-06 NOTE — Telephone Encounter (Signed)
Patient calling to request refills on gabapentin (dosage at 200mg  2x a day, states it is pregabalin) and sertraline (for nerves and depression per patient at 100mg ), please advise. Katha Hamming

## 2022-09-07 NOTE — Telephone Encounter (Signed)
Patient returned call regarding message from PCP, informed her that the Lyrica requested to be refilled has been refilled, asked patient about the Sertraline and advised her that she reported back in December that she did not take this, Patient at first said she did not remember saying that, then I informed her that it was documented in her chart that she told the Provider that she did not take that. Informed her that she reported taking the Effexor and Trazadone, asked if she needed the Effexor refilled?, Patient responded "what was that?," I explained that she has been taking this medication, she said that she forgot and had to look it up at [facility], she then states that she wanted her other medication Estradiol-Norethindrone  refilled she stated that her phamacy was waiting on approval, I informed her that I would let the provider know of this request. Patient voiced a verbal understanding

## 2022-09-07 NOTE — Telephone Encounter (Signed)
I have refilled the Estradiol.

## 2022-09-07 NOTE — Telephone Encounter (Signed)
LVM for patient to return call at earliest convenience    RE:  To discuss message from provider regarding medication refills

## 2022-09-16 ENCOUNTER — Telehealth: Payer: Self-pay | Admitting: Family Medicine

## 2022-09-16 NOTE — Telephone Encounter (Signed)
Spoke with patient and informed her that Dr. Wyline Mood was out of the office until Monday and that she should maybe make an appointment to come in for the Prescription for the Trazadone , looked through chart did not see where Dr. Wyline Mood prescribed this medication. Patient agreed to make an appointment to speak with provider.

## 2022-09-16 NOTE — Telephone Encounter (Signed)
Patient calling to request refill of trazodone to pharmacy on file. Patient states needs it ASAP, nformed PCP is currently out of office. Katha Hamming

## 2022-09-20 ENCOUNTER — Encounter: Payer: Self-pay | Admitting: Family Medicine

## 2022-09-20 ENCOUNTER — Other Ambulatory Visit: Payer: Self-pay | Admitting: Family Medicine

## 2022-09-20 NOTE — Telephone Encounter (Signed)
I have refilled the Trazodone in the past for pt. It looks like someone else may have sent another prescription in for Trazodone 100-200mg  at night? Please inquire if this was her behavioral health provider? If so she will need to get refills from them

## 2022-09-20 NOTE — Progress Notes (Deleted)
   ANNUAL EXAM Patient name: Jean Calderon MRN [id number]  Date of birth: April 22, 1970 Chief Complaint:   No chief complaint on file.  History of Present Illness:   Jean Calderon is a 52 y.o. No obstetric history on file. female being seen today for a routine annual exam.   Current complaints: ***   No LMP recorded. Patient is postmenopausal.   Last MXR: No recent. Ordered already by PCP.  Last Pap/Pap History: No recent on file. H/O abnormal pap: {yes/yes***/no:23866}   Health Maintenance Due  Topic Date Due   Hepatitis C Screening  Never done   PAP SMEAR-Modifier  Never done   MAMMOGRAM  Never done   COVID-19 Vaccine (3 - 2023-24 season) 10/30/2021   HEMOGLOBIN A1C  08/06/2022    Review of Systems:   Pertinent items are noted in HPI Denies any headaches, blurred vision, fatigue, shortness of breath, chest pain, abdominal pain, abnormal vaginal discharge/itching/odor/irritation, problems with periods, bowel movements, urination, or intercourse unless otherwise stated above. *** Pertinent History Reviewed:  Reviewed past medical,surgical, social and family history.  Reviewed problem list, medications and allergies. Physical Assessment:  There were no vitals filed for this visit.There is no height or weight on file to calculate BMI.   Physical Examination:  General appearance - well appearing, and in no distress Mental status - alert, oriented to person, place, and time Psych:  She has a normal mood and affect Skin - warm and dry, normal color, no suspicious lesions noted Chest - effort normal Heart - normal rate  Breasts - breasts appear normal, no suspicious masses, no skin or nipple changes or axillary nodes Abdomen - soft, nontender, nondistended, no masses or organomegaly Pelvic -  VULVA: normal appearing vulva with no masses, tenderness or lesions  VAGINA: normal appearing vagina with normal color and discharge, no lesions  CERVIX: normal appearing cervix without  discharge or lesions, no CMT UTERUS: uterus is felt to be normal size, shape, consistency and nontender  ADNEXA: No adnexal masses or tenderness noted. Extremities:  No swelling or varicosities noted  Chaperone present for exam  No results found for this or any previous visit (from the past 24 hour(s)).  Assessment & Plan:  Diagnoses and all orders for this visit:  Encounter for annual routine gynecological examination  - Cervical cancer screening: Discussed guidelines. Pap with HPV done - Breast Health: Encouraged self breast awareness/SBE. Discussed limits of clinical breast exam for detecting breast cancer. Discussed importance of annual MXR.  Given Rx by PCP.  - Climacteric/Sexual health: Reviewed typical and atypical symptoms of menopause/peri-menopause. Discussed PMB and to call if any amount of spotting.  - Colonoscopy: {Blank single:19197::"Per PCP","up to date","declines"} - F/U 12 months and prn      No orders of the defined types were placed in this encounter.   Meds: No orders of the defined types were placed in this encounter.   Follow-up: No follow-ups on file.  Milas Hock, MD 09/20/2022 3:41 PM

## 2022-09-21 IMAGING — DX DG CHEST 1V PORT
1 series · 1 of 1 positions shown · non-contrast
Comparison: 03/09/2021

CLINICAL DATA: Chest pain and shortness of breath for the past 3
days.

EXAM:
PORTABLE CHEST 1 VIEW

[chest ap]
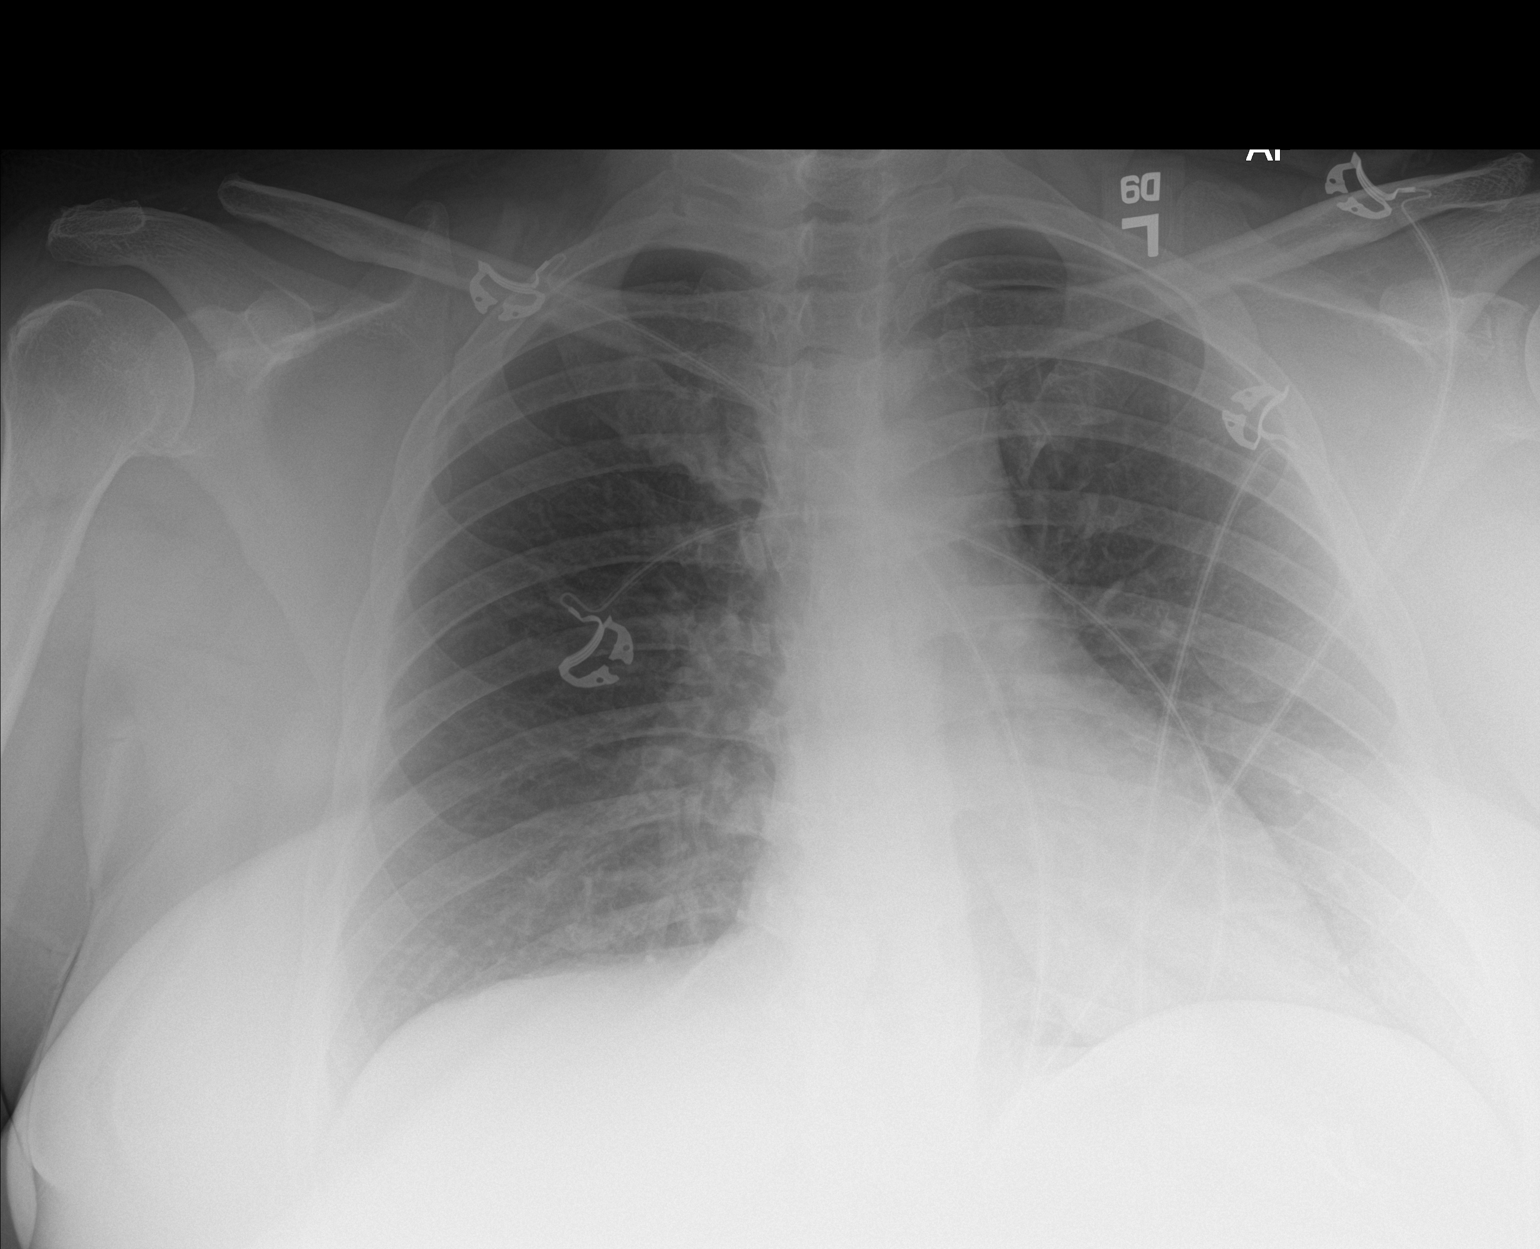

[1 of 1 positions shown; findings below may reference images not displayed]

FINDINGS: Normal sized heart. Clear lungs with normal vascularity. Surgical
absence of the distal right clavicle.
IMPRESSION: No acute abnormality.

## 2022-09-22 ENCOUNTER — Encounter: Payer: BLUE CROSS/BLUE SHIELD | Admitting: Obstetrics and Gynecology

## 2022-09-22 DIAGNOSIS — Z01419 Encounter for gynecological examination (general) (routine) without abnormal findings: Secondary | ICD-10-CM

## 2022-09-22 NOTE — Telephone Encounter (Signed)
Spoke with patient and verified that the Trazodone 100-200 mg was a medication that was being filled by her Behavorial health provider,patient is aware that her provider there would need to fill any other refills for that medication. Patient gave a verbal understanding

## 2022-09-23 ENCOUNTER — Other Ambulatory Visit: Payer: Self-pay | Admitting: Family Medicine

## 2022-09-24 MED ORDER — LISINOPRIL-HYDROCHLOROTHIAZIDE 20-12.5 MG PO TABS: 1.0000 | ORAL_TABLET | Freq: Every day | ORAL | 3 refills | Status: DC

## 2022-09-24 NOTE — Progress Notes (Deleted)
Referring-Alethea Rucker MD Reason for referral-congestive heart failure and coronary artery disease  HPI: 52 year old female for evaluation of congestive heart failure and coronary artery disease at request of Sundra Aland MD.  Previously followed by Dr. Desma Maxim in [facility].  Had PCI in 2010 with not all records available.  Cardiac catheterization in 2022 showed 50% proximal LAD and 40% distal, 30% first diagonal with patent prior stent, 30% lesions in the circumflex, 0M1 and left PDA and 30% in the proximal mid and distal right coronary artery.  Ejection fraction 60%.  Echocardiogram January 2023 showed normal LV function, mild left ventricular hypertrophy, grade 1 diastolic dysfunction and no significant valvular disease.  Also with history of hypertension, hyperlipidemia, obstructive sleep apnea.  Current Outpatient Medications  Medication Sig Dispense Refill   acetaminophen (TYLENOL) 325 MG tablet Take 2 tablets (650 mg total) by mouth every 6 (six) hours as needed for moderate pain. 30 tablet 0   albuterol (VENTOLIN HFA) 108 (90 Base) MCG/ACT inhaler Inhale 2 puffs into the lungs every 4 (four) hours as needed for shortness of breath or wheezing. 1 each 1   amitriptyline (ELAVIL) 25 MG tablet Take 1 tablet (25 mg total) by mouth at bedtime. Once Duloxetine is stopped, start 1 tablet at nighttime for 2-3 days, then increase to 2 tab at nighttime. 60 tablet 3   aspirin EC 81 MG tablet Take 81 mg by mouth daily.     atorvastatin (LIPITOR) 40 MG tablet Take 1 tablet (40 mg total) by mouth daily. 90 tablet 1   cloNIDine (CATAPRES) 0.1 MG tablet Take 0.1 mg by mouth 2 (two) times daily.     clopidogrel (PLAVIX) 75 MG tablet Take 1 tablet (75 mg total) by mouth daily. 90 tablet 1   Continuous Blood Gluc Receiver (DEXCOM G7 RECEIVER) DEVI Check sugars daily Dx E11.9 1 each 2   Continuous Blood Gluc Sensor (DEXCOM G7 SENSOR) MISC Check sugars daily Dx E11.9 1 each 5   Continuous Glucose  Transmitter (DEXCOM G6 TRANSMITTER) MISC      Crisaborole (EUCRISA) 2 % OINT Apply 1 Application topically 2 (two) times daily.     Estradiol-Norethindrone Acet 0.5-0.1 MG tablet TAKE 1 TABLET BY MOUTH DAILY 84 tablet 0   fexofenadine (ALLEGRA) 180 MG tablet Take 1 tablet (180 mg total) by mouth daily. 90 tablet 3   fluconazole (DIFLUCAN) 150 MG tablet Take 1 tablet (150 mg total) by mouth daily. 1 tablet 1   HUMALOG 100 UNIT/ML injection INJECT 25 UNITS INTO THE SKIN 3 TIMES DAILY BEFORE MEALS. USE INSULIN PUMP 10 mL 1   hydrOXYzine (ATARAX) 25 MG tablet Take 1 tablet (25 mg total) by mouth every 8 (eight) hours as needed. 45 tablet 1   Insulin Aspart, w/Niacinamide, (FIASP) 100 UNIT/ML SOLN To be used with insulin pump.     Insulin Disposable Pump (OMNIPOD 5 G6 PODS, GEN 5,) MISC CHANGE POD EVERY OTHER DAY. 30 each 0   isosorbide mononitrate (IMDUR) 30 MG 24 hr tablet TAKE 1 TABLET(30 MG) BY MOUTH DAILY 90 tablet 1   JARDIANCE 25 MG TABS tablet Take 1 tablet (25 mg total) by mouth daily. 30 tablet 0   lisinopril-hydrochlorothiazide (ZESTORETIC) 20-12.5 MG tablet Take 1 tablet by mouth daily. 90 tablet 3   metoprolol succinate (TOPROL-XL) 100 MG 24 hr tablet Take 100 mg by mouth daily.     mometasone (NASONEX) 50 MCG/ACT nasal spray One spray in each nostril twice a day, use left  hand for right nostril, and right hand for left nostril.  Please dispense one bottle. 1 g 6   montelukast (SINGULAIR) 10 MG tablet TAKE 1 TABLET(10 MG) BY MOUTH AT BEDTIME 30 tablet 3   nitroGLYCERIN (NITROSTAT) 0.4 MG SL tablet Place 0.4 mg under the tongue every 5 (five) minutes as needed for chest pain.     nystatin ointment (MYCOSTATIN) Apply 1 Application topically 2 (two) times daily. 30 g 2   olopatadine (PATADAY) 0.1 % ophthalmic solution Place 1 drop into both eyes 2 (two) times daily. 5 mL 3   pantoprazole (PROTONIX) 40 MG tablet Take 1 tablet (40 mg total) by mouth daily. 30 tablet 3   pregabalin (LYRICA) 200  MG capsule 1 CAPSULE BY MOUITH TWICE DAILY 60 capsule 1   sucralfate (CARAFATE) 1 g tablet TAKE 1 TABLET(1 GRAM) BY MOUTH FOUR TIMES DAILY AT BEDTIME WITH MEALS 120 tablet 0   topiramate (TOPAMAX) 100 MG tablet Take by mouth 2 (two) times daily.     traZODone (DESYREL) 100 MG tablet Take 100-200 mg by mouth at bedtime.     No current facility-administered medications for this visit.    Allergies  Allergen Reactions   Tramadol     Other reaction(s): Mental Status Changes (intolerance) Caused Hallucinations   Nsaids Other (See Comments)    Cannot take because she's on Plavix.   Other reaction(s): Bleeding (intolerance) Cannot take because she's on Plavix.    Sulfamethoxazole-Trimethoprim Nausea And Vomiting    History of Colitis   Ibuprofen     Not an allergy. Pt stated she can't take it because of current use of ASA and Plavix     Past Medical History:  Diagnosis Date   CAD (coronary artery disease)    Cervical radiculopathy 08/15/2017   CKD (chronic kidney disease), stage II    COVID-19 01/2022   DM (diabetes mellitus) (HCC)    HTN (hypertension)    Lumbar radiculopathy 06/21/2019   Last Assessment & Plan: Formatting of this note might be different from the original. Scheduled for laminectomy L5 4/27 Pre-op complete at hospital; labs and ekg reviewed. Scheduled for cardiology eval in one day Medical cleared for procedure by IM evaluation.   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 Severe tendinosis of the supraspinatus tendon.  2. Mild tendinosis of the infraspinatus tendon.  3. Thickening of the inferior joint capsule and intermediate signal  material effacing the normal subcoracoid fat as can be seen with  adhesive capsulitis.    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    Past Surgical History:   Procedure Laterality Date   APPENDECTOMY     BACK SURGERY     CESAREAN SECTION     CORONARY ANGIOPLASTY WITH STENT PLACEMENT     MULTIPLE TOOTH EXTRACTIONS     ROTATOR CUFF REPAIR      Social History   Socioeconomic History   Marital status: Married    Spouse name: Not on file   Number of children: Not on file   Years of education: Not on file   Highest education level: Not on file  Occupational History   Not on file  Tobacco Use   Smoking status: Former    Types: Cigarettes   Smokeless tobacco: Never  Vaping Use   Vaping status: Never Used  Substance and Sexual  Activity   Alcohol use: Not Currently   Drug use: Never   Sexual activity: Not on file  Other Topics Concern   Not on file  Social History Narrative   Not on file   Social Determinants of Health   Financial Resource Strain: Not on file  Food Insecurity: Patient Declined (06/06/2022)   Received from [facility], [facility]   Hunger Vital Sign    Worried About Running Out of Food in the Last Year: Patient declined    Ran Out of Food in the Last Year: Patient declined  Transportation Needs: No Transportation Needs (06/10/2022)   Received from [employer], [facility]   PRAPARE - Transportation    Lack of Transportation (Medical): No    Lack of Transportation (Non-Medical): No  Physical Activity: Not on file  Stress: No Stress Concern Present (06/06/2022)   Received from [facility], [facility] of Occupational Health - Occupational Stress Questionnaire    Feeling of Stress : Not at all  Social Connections: Unknown (04/23/2022)   Received from [facility], [facility]   Social Network    Social Network: Not on file  Intimate Partner Violence: Not At Risk (09/07/2022)   Received from [facility], [facility]   HITS    Over the last 12 months how often did your partner physically hurt you?: 1    Over the last 12 months how often did your partner insult you or talk down  to you?: 1    Over the last 12 months how often did your partner threaten you with physical harm?: 1    Over the last 12 months how often did your partner scream or curse at you?: 1    Family History  Problem Relation Age of Onset   Heart disease Mother    Heart disease Sister     ROS: no fevers or chills, productive cough, hemoptysis, dysphasia, odynophagia, melena, hematochezia, dysuria, hematuria, rash, seizure activity, orthopnea, PND, pedal edema, claudication. Remaining systems are negative.  Physical Exam:   There were no vitals taken for this visit.  General:  Well developed/well nourished in NAD Skin warm/dry Patient not depressed No peripheral clubbing Back-normal HEENT-normal/normal eyelids Neck supple/normal carotid upstroke bilaterally; no bruits; no JVD; no thyromegaly chest - CTA/ normal expansion CV - RRR/normal S1 and S2; no murmurs, rubs or gallops;  PMI nondisplaced Abdomen -NT/ND, no HSM, no mass, + bowel sounds, no bruit 2+ femoral pulses, no bruits Ext-no edema, chords, 2+ DP Neuro-grossly nonfocal  ECG - personally reviewed  A/P  1 coronary artery disease-  2 history of congestive heart failure-  3 hypertension-  4 hyperlipidemia-  5 obstructive sleep apnea-  Olga Millers, MD

## 2022-09-30 ENCOUNTER — Telehealth: Payer: Self-pay | Admitting: Family Medicine

## 2022-09-30 NOTE — Telephone Encounter (Signed)
Patient requesting CMA or provider give patient a call. Patient states medicine prescribed for yeast infection on legs is not helping and that infection is worse. Offered patient appointment, patient states she will be at work and would like to speak to the provider over the phone. Please advise. Katha Hamming

## 2022-10-01 NOTE — Telephone Encounter (Signed)
Called patient to verify infection, patient states that the infection she was referring to was the rash that she was seen for. I advised patient to make an appointment for issue to  be re evaluated.   Patient gave a verbal understanding!!

## 2022-10-05 ENCOUNTER — Encounter: Payer: Self-pay | Admitting: Physical Medicine and Rehabilitation

## 2022-10-06 ENCOUNTER — Ambulatory Visit: Payer: BLUE CROSS/BLUE SHIELD | Admitting: Cardiology

## 2022-10-11 ENCOUNTER — Other Ambulatory Visit: Payer: Self-pay | Admitting: Family Medicine

## 2022-10-11 DIAGNOSIS — K30 Functional dyspepsia: Secondary | ICD-10-CM

## 2022-10-11 DIAGNOSIS — R0789 Other chest pain: Secondary | ICD-10-CM

## 2022-10-18 ENCOUNTER — Encounter
Payer: Medicaid Other | Attending: Physical Medicine and Rehabilitation | Admitting: Physical Medicine and Rehabilitation

## 2022-10-18 ENCOUNTER — Encounter: Payer: Self-pay | Admitting: Family Medicine

## 2022-10-18 ENCOUNTER — Ambulatory Visit (INDEPENDENT_AMBULATORY_CARE_PROVIDER_SITE_OTHER): Payer: Medicaid Other | Admitting: Family Medicine

## 2022-10-18 VITALS — BP 87/66 | HR 83 | Temp 98.3°F | Resp 18 | Ht 69.0 in | Wt 283.2 lb

## 2022-10-18 DIAGNOSIS — F5101 Primary insomnia: Secondary | ICD-10-CM | POA: Diagnosis not present

## 2022-10-18 DIAGNOSIS — E1142 Type 2 diabetes mellitus with diabetic polyneuropathy: Secondary | ICD-10-CM | POA: Insufficient documentation

## 2022-10-18 DIAGNOSIS — L308 Other specified dermatitis: Secondary | ICD-10-CM | POA: Diagnosis not present

## 2022-10-18 DIAGNOSIS — G894 Chronic pain syndrome: Secondary | ICD-10-CM | POA: Insufficient documentation

## 2022-10-18 DIAGNOSIS — N760 Acute vaginitis: Secondary | ICD-10-CM

## 2022-10-18 DIAGNOSIS — M545 Low back pain, unspecified: Secondary | ICD-10-CM | POA: Insufficient documentation

## 2022-10-18 DIAGNOSIS — G8929 Other chronic pain: Secondary | ICD-10-CM | POA: Insufficient documentation

## 2022-10-18 MED ORDER — TRAZODONE HCL 100 MG PO TABS: 100.0000 mg | ORAL_TABLET | Freq: Every evening | ORAL | 0 refills | Status: DC | PRN

## 2022-10-18 MED ORDER — FLUCONAZOLE 100 MG PO TABS
100.0000 mg | ORAL_TABLET | Freq: Every day | ORAL | 0 refills | Status: DC
Start: 2022-10-18 — End: 2022-11-18

## 2022-10-18 MED ORDER — HYDROXYZINE HCL 25 MG PO TABS
25.0000 mg | ORAL_TABLET | Freq: Three times a day (TID) | ORAL | 1 refills | Status: DC | PRN
Start: 2022-10-18 — End: 2022-11-24

## 2022-10-18 NOTE — Progress Notes (Signed)
Acute Office Visit  Subjective:     Patient ID: Jean Calderon, female    DOB: April 13, 1970, 52 y.o.   MRN: [id number]  Chief Complaint  Patient presents with   Rash    Patient states that she is still having issues with a rash that she has previously spoke of, she states that she is itching all over, and she has one place on her right shoulder that is bleeding due to scratching . Patient is also concerned that she may have a yeast infection as well that started about 2 days ago    Rash   Patient is in today for acute visit.  Pt reports she has a recurrent rash. She has itchy skin all over. She has changed her lotions and soaps to hypoallogeneic. She is still itchy. She washes her clothes with TIDE. She has hx of allergic rhinitis and using Allegra OTC, Singulair 10mg  daily, Nasonex and Pataday. She use to use Hydroxyzine 25mg  TID prn but has been out of refills.  She also reports yeast vaginal infection. She does have vaginal pruritus.   Pt also has hx of insomnia. She was taking Trazodone 100mg  at night that helped. She needs this refilled. She was referred to [facility] but is yet to see them. She reports now she has a new job and is unable to miss work. She is in the process of finding one.   Review of Systems  Genitourinary:        Vaginal itching  Skin:  Positive for rash.  Psychiatric/Behavioral:  The patient has insomnia.   All other systems reviewed and are negative.       Objective:    BP (!) 87/66   Pulse 83   Temp 98.3 F (36.8 C) (Oral)   Resp 18   Ht 5\' 9"  (1.753 m)   Wt 283 lb 3.2 oz (128.5 kg)   SpO2 96%   BMI 41.82 kg/m    Physical Exam Vitals and nursing note reviewed.  Constitutional:      Appearance: Normal appearance. She is obese.  HENT:     Head: Normocephalic and atraumatic.  Pulmonary:     Effort: Pulmonary effort is normal.  Abdominal:     General: Abdomen is flat.  Skin:    General: Skin is warm.     Capillary Refill: Capillary refill takes  less than 2 seconds.     Findings: Rash present.  Neurological:     General: No focal deficit present.     Mental Status: She is alert and oriented to person, place, and time. Mental status is at baseline.  Psychiatric:        Mood and Affect: Mood normal.        Behavior: Behavior normal.        Thought Content: Thought content normal.        Judgment: Judgment normal.     No results found for any visits on 10/18/22.      Assessment & Plan:   Problem List Items Addressed This Visit       Other   Primary insomnia   Relevant Medications   traZODone (DESYREL) 100 MG tablet   Other Visit Diagnoses     Pruritic dermatitis    -  Primary   Relevant Medications   hydrOXYzine (ATARAX) 25 MG tablet   Acute vaginitis       Relevant Medications   fluconazole (DIFLUCAN) 100 MG tablet     Pruritic dermatitis -  hydrOXYzine HCl; Take 1 tablet (25 mg total) by mouth every 8 (eight) hours as needed.  Dispense: 45 tablet; Refill: 1  Acute vaginitis -     Fluconazole; Take 1 tablet (100 mg total) by mouth daily.  Dispense: 5 tablet; Refill: 0  Primary insomnia -     traZODone HCl; Take 1 tablet (100 mg total) by mouth at bedtime as needed for sleep.  Dispense: 90 tablet; Refill: 0  Send hydroxyzine 25mg  TID prn for pruritus.  Diflucan 100mg  daily x 5 days for vaginitis likely from yeast. Have refilled pt's Trazodone 100mg  at night prn for sleep. Have advised pt to see [facility] soon.   Meds ordered this encounter  Medications   hydrOXYzine (ATARAX) 25 MG tablet    Sig: Take 1 tablet (25 mg total) by mouth every 8 (eight) hours as needed.    Dispense:  45 tablet    Refill:  1   fluconazole (DIFLUCAN) 100 MG tablet    Sig: Take 1 tablet (100 mg total) by mouth daily.    Dispense:  5 tablet    Refill:  0   traZODone (DESYREL) 100 MG tablet    Sig: Take 1 tablet (100 mg total) by mouth at bedtime as needed for sleep.    Dispense:  90 tablet    Refill:  0    No follow-ups on  file.  Suzan Slick, MD

## 2022-10-22 IMAGING — DX DG CHEST 2V
2 series · 2 of 2 positions shown · non-contrast
Comparison: Chest x-ray 03/12/2021.

CLINICAL DATA: 50-year-old female with history of shortness of
breath.

EXAM:
CHEST - 2 VIEW

[chest pa]
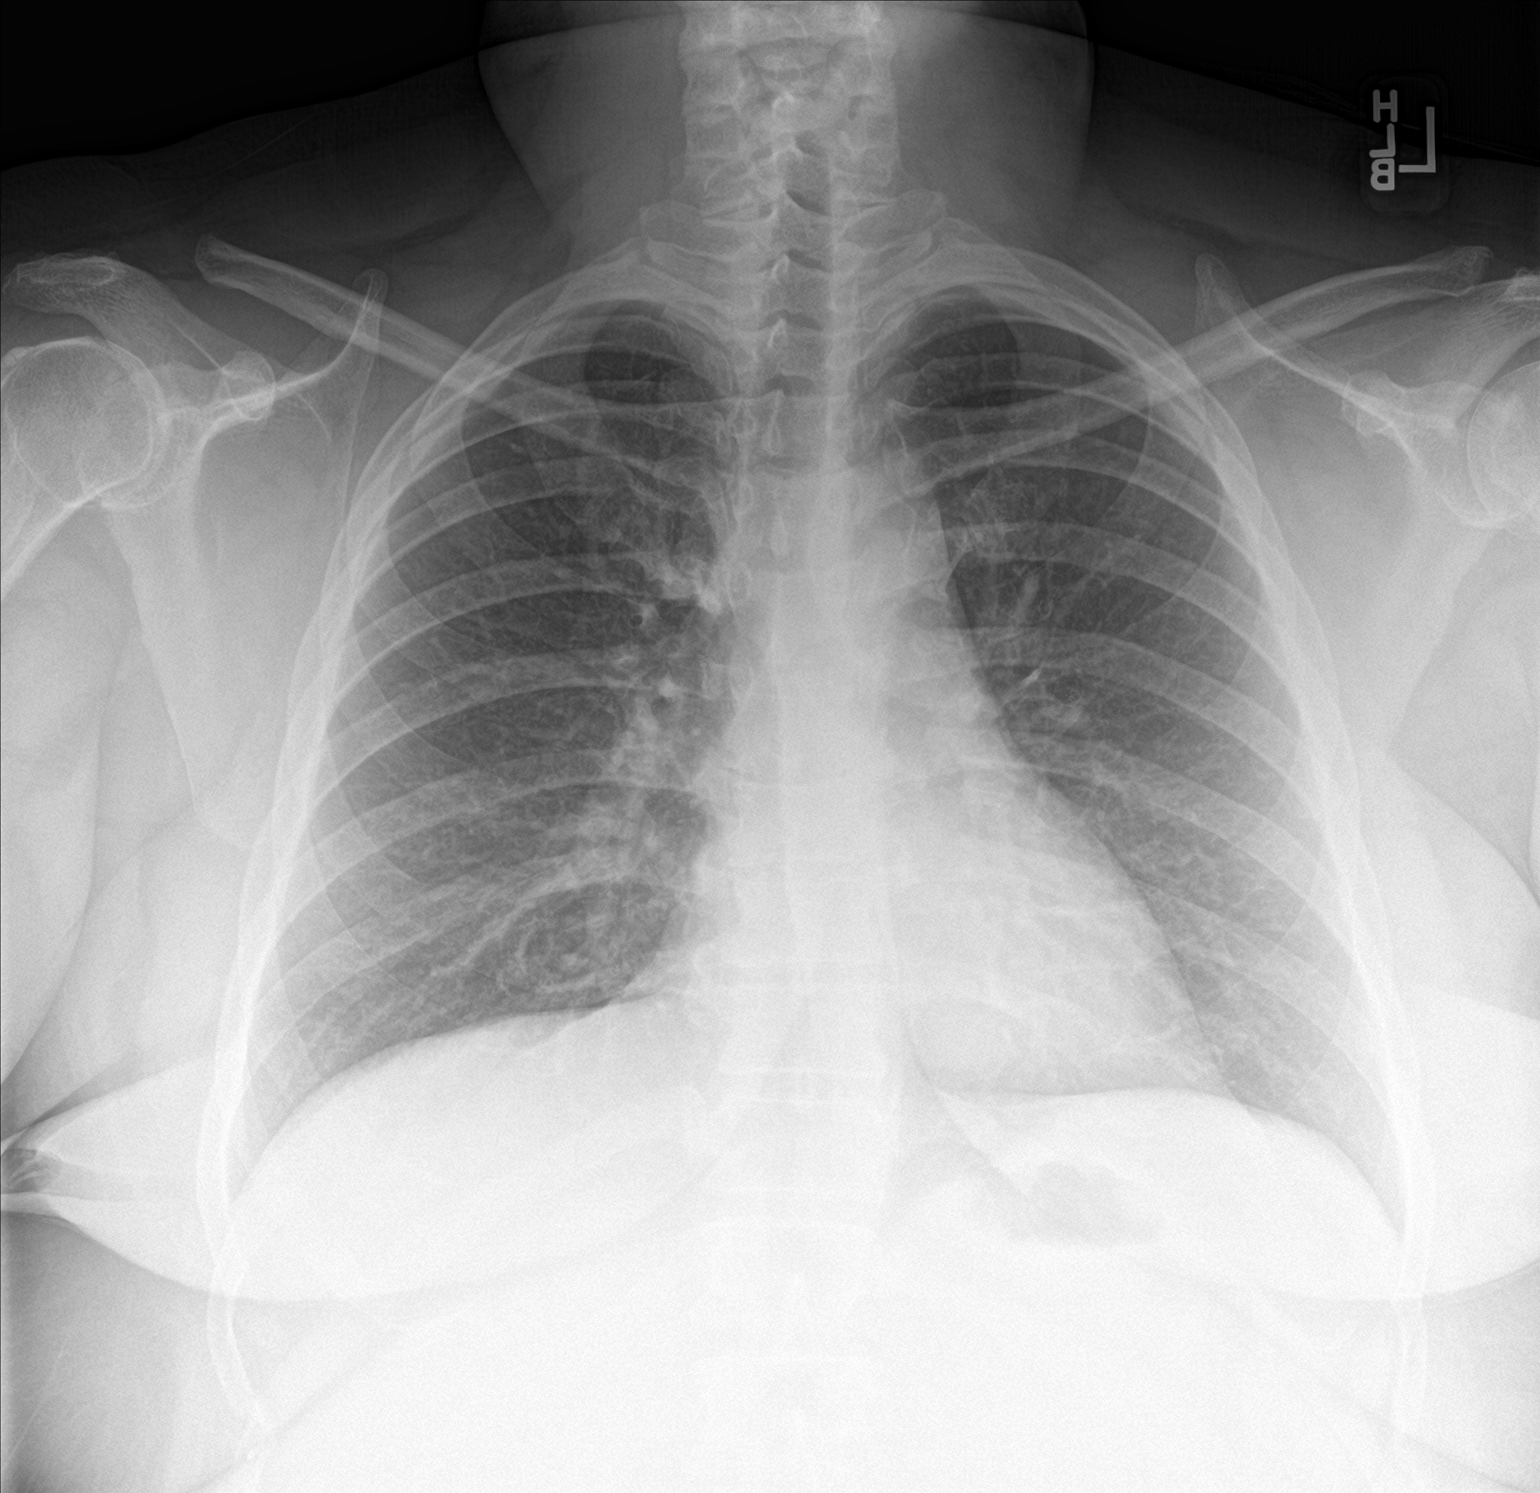

[chest lat]
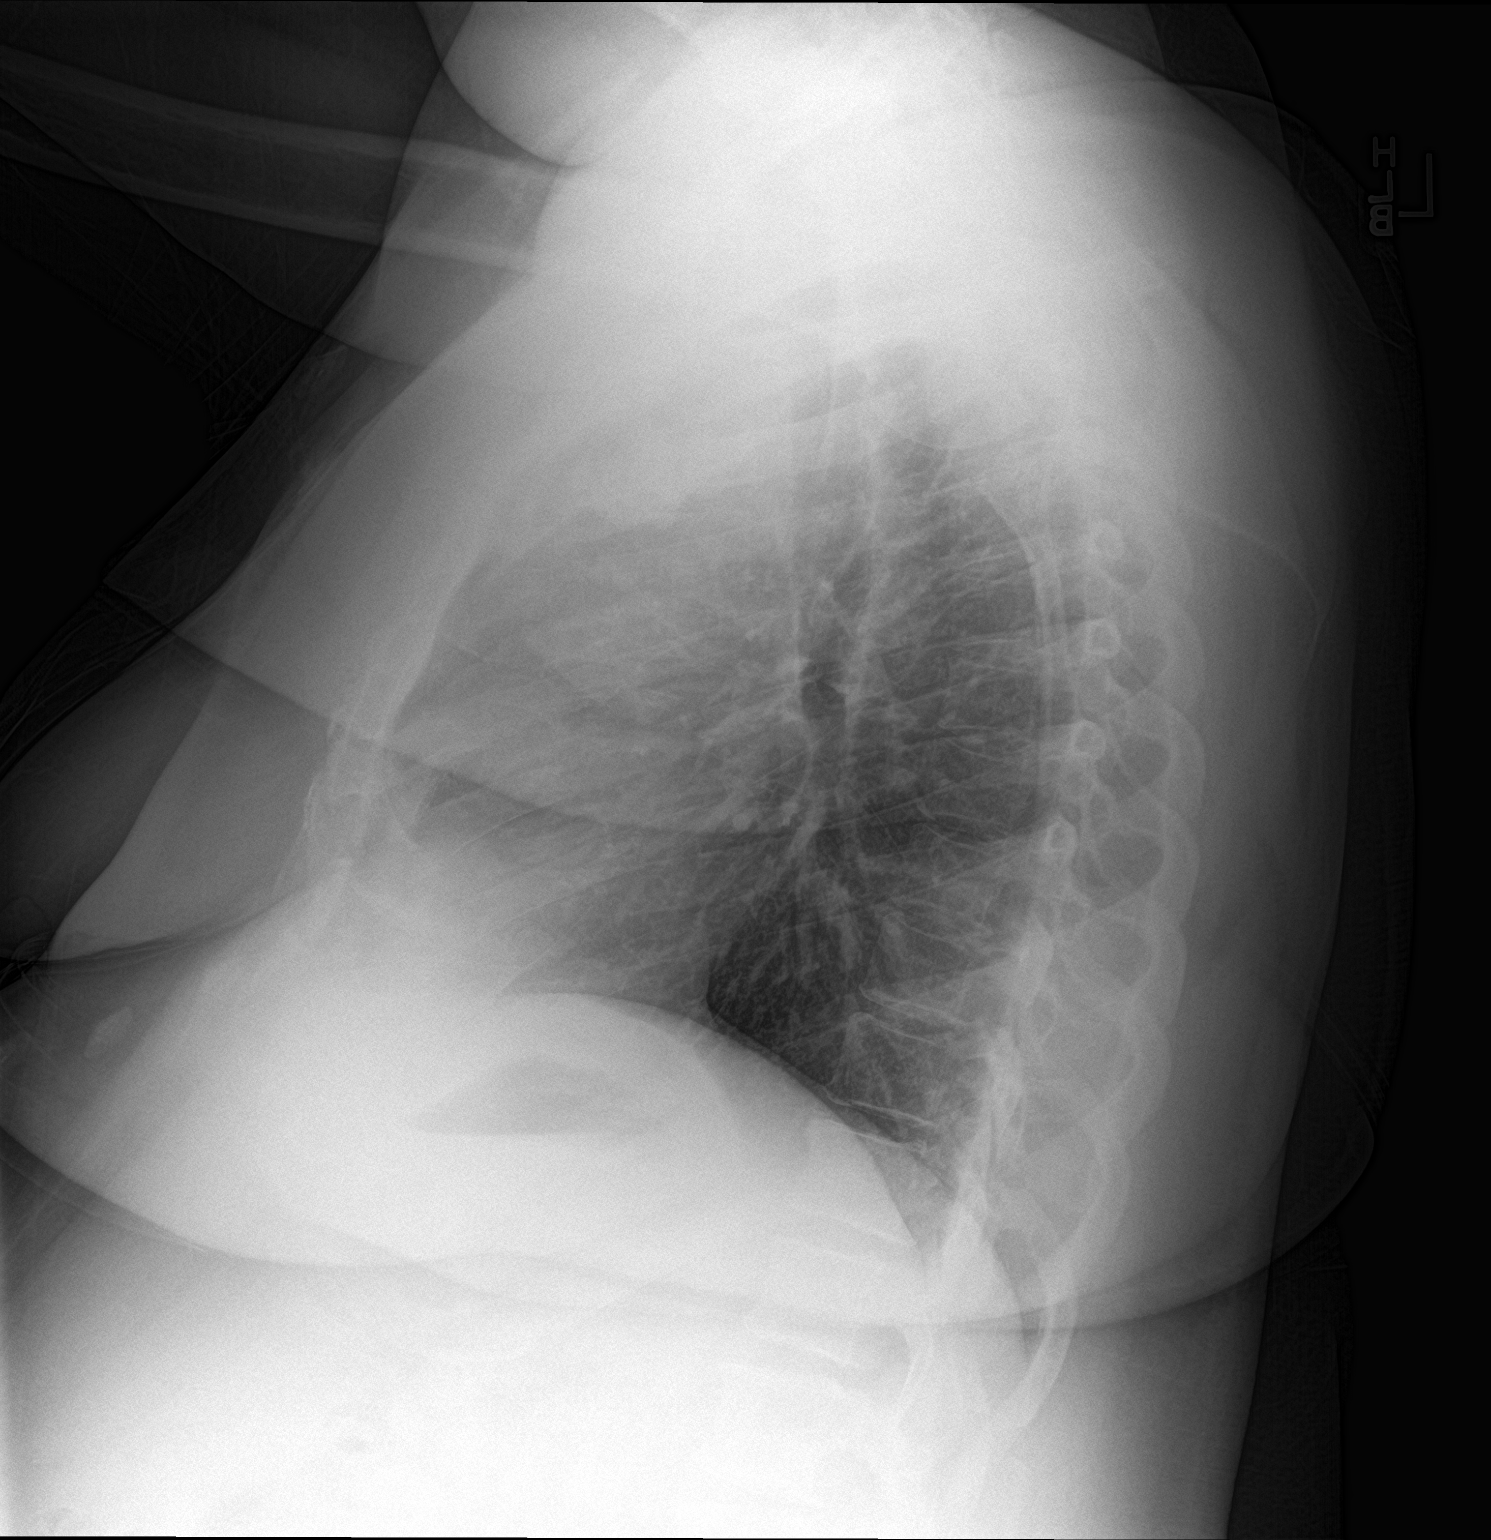

[2 of 2 positions shown; findings below may reference images not displayed]

FINDINGS: Lung volumes are normal. No consolidative airspace disease. No
pleural effusions. No pneumothorax. No pulmonary nodule or mass
noted. Pulmonary vasculature and the cardiomediastinal silhouette
are within normal limits.
IMPRESSION: No radiographic evidence of acute cardiopulmonary disease.

## 2022-10-26 ENCOUNTER — Other Ambulatory Visit: Payer: Self-pay | Admitting: Family Medicine

## 2022-10-26 DIAGNOSIS — J301 Allergic rhinitis due to pollen: Secondary | ICD-10-CM

## 2022-11-09 ENCOUNTER — Other Ambulatory Visit: Payer: Self-pay | Admitting: Family Medicine

## 2022-11-09 ENCOUNTER — Encounter: Payer: Self-pay | Admitting: Physical Medicine and Rehabilitation

## 2022-11-09 ENCOUNTER — Telehealth: Payer: Self-pay | Admitting: Family Medicine

## 2022-11-09 DIAGNOSIS — E1142 Type 2 diabetes mellitus with diabetic polyneuropathy: Secondary | ICD-10-CM

## 2022-11-09 DIAGNOSIS — I2510 Atherosclerotic heart disease of native coronary artery without angina pectoris: Secondary | ICD-10-CM

## 2022-11-09 DIAGNOSIS — I509 Heart failure, unspecified: Secondary | ICD-10-CM

## 2022-11-09 DIAGNOSIS — M5416 Radiculopathy, lumbar region: Secondary | ICD-10-CM

## 2022-11-09 NOTE — Telephone Encounter (Signed)
Spoke with patient regarding refill request for Gabapentin , informed patient that Dr. Wyline Mood did not fill Rx for Gabapentin, patient states that she would call her neurologist because she believes that they were the ones to have filled Rx in the past

## 2022-11-09 NOTE — Telephone Encounter (Signed)
Prescription Request  11/09/2022  LOV: 10/18/2022  What is the name of the medication or equipment? Gabapentin (?)  Have you contacted your pharmacy to request a refill? Yes   Which pharmacy would you like this sent to?  [id number] - [place], [place] - [address] [place] [address] Phone: [phone] Fax: [phone]    Patient notified that their request is being sent to the clinical staff for review and that they should receive a response within 2 business days.   Please advise at Mobile [phone] (mobile)

## 2022-11-12 ENCOUNTER — Ambulatory Visit: Payer: Medicaid Other | Attending: Cardiology | Admitting: Cardiology

## 2022-11-12 ENCOUNTER — Ambulatory Visit (INDEPENDENT_AMBULATORY_CARE_PROVIDER_SITE_OTHER): Payer: Commercial Managed Care - PPO | Admitting: Podiatry

## 2022-11-12 ENCOUNTER — Encounter: Payer: Self-pay | Admitting: Cardiology

## 2022-11-12 VITALS — BP 104/64 | HR 85 | Ht 69.0 in | Wt 287.0 lb

## 2022-11-12 DIAGNOSIS — E7800 Pure hypercholesterolemia, unspecified: Secondary | ICD-10-CM

## 2022-11-12 DIAGNOSIS — I2510 Atherosclerotic heart disease of native coronary artery without angina pectoris: Secondary | ICD-10-CM | POA: Diagnosis not present

## 2022-11-12 DIAGNOSIS — Z91199 Patient's noncompliance with other medical treatment and regimen due to unspecified reason: Secondary | ICD-10-CM

## 2022-11-12 DIAGNOSIS — I739 Peripheral vascular disease, unspecified: Secondary | ICD-10-CM | POA: Diagnosis not present

## 2022-11-12 DIAGNOSIS — R0683 Snoring: Secondary | ICD-10-CM

## 2022-11-12 DIAGNOSIS — I10 Essential (primary) hypertension: Secondary | ICD-10-CM | POA: Diagnosis not present

## 2022-11-12 MED ORDER — NITROGLYCERIN 0.4 MG SL SUBL: 0.4000 mg | SUBLINGUAL_TABLET | SUBLINGUAL | 6 refills | Status: AC | PRN

## 2022-11-12 MED ORDER — METOPROLOL SUCCINATE ER 100 MG PO TB24: 100.0000 mg | ORAL_TABLET | Freq: Every day | ORAL | 3 refills | Status: DC

## 2022-11-12 NOTE — Progress Notes (Signed)
No show

## 2022-11-12 NOTE — Progress Notes (Signed)
Referring-Alethea Rucker MD Reason for referral-CAD  HPI: 52 year old female for evaluation of CAD at request of Sundra Aland MD.  Patient had previous PCI in 2010.  Cardiac catheterization March 2022 showed ejection fraction 55 to 65%, normal left ventricular end-diastolic pressure, 50% proximal, 30% mid, 40% distal LAD, 30% first diagonal, 30% ramus, 30% proximal, mid and distal complex, 30% OM, 30% RCA.  Echocardiogram January 2023 showed normal LV function, mild left ventricular hypertrophy, grade 1 diastolic dysfunction.  Also with history of peripheral vascular disease.  Patient does have some dyspnea on exertion but no orthopnea, PND, pedal edema, exertional chest pain or syncope.  She has problems with bilateral lower extremity pain both at night and also with ambulation.  Current Outpatient Medications  Medication Sig Dispense Refill   acetaminophen (TYLENOL) 325 MG tablet Take 2 tablets (650 mg total) by mouth every 6 (six) hours as needed for moderate pain. 30 tablet 0   albuterol (VENTOLIN HFA) 108 (90 Base) MCG/ACT inhaler Inhale 2 puffs into the lungs every 4 (four) hours as needed for shortness of breath or wheezing. 1 each 1   aspirin EC 81 MG tablet Take 81 mg by mouth daily.     atorvastatin (LIPITOR) 40 MG tablet Take 1 tablet (40 mg total) by mouth daily. 90 tablet 1   cloNIDine (CATAPRES) 0.1 MG tablet Take 0.1 mg by mouth 2 (two) times daily.     clopidogrel (PLAVIX) 75 MG tablet TAKE 1 TABLET(75 MG) BY MOUTH DAILY 90 tablet 1   Continuous Blood Gluc Receiver (DEXCOM G7 RECEIVER) DEVI Check sugars daily Dx E11.9 1 each 2   Continuous Blood Gluc Sensor (DEXCOM G7 SENSOR) MISC Check sugars daily Dx E11.9 1 each 5   Continuous Glucose Transmitter (DEXCOM G6 TRANSMITTER) MISC      Crisaborole (EUCRISA) 2 % OINT Apply 1 Application topically 2 (two) times daily.     Estradiol-Norethindrone Acet 0.5-0.1 MG tablet TAKE 1 TABLET BY MOUTH DAILY 84 tablet 0   fexofenadine  (ALLEGRA) 180 MG tablet Take 1 tablet (180 mg total) by mouth daily. 90 tablet 3   fluconazole (DIFLUCAN) 100 MG tablet Take 1 tablet (100 mg total) by mouth daily. 5 tablet 0   HUMALOG 100 UNIT/ML injection INJECT 25 UNITS INTO THE SKIN 3 TIMES DAILY BEFORE MEALS. USE INSULIN PUMP 10 mL 1   hydrOXYzine (ATARAX) 25 MG tablet Take 1 tablet (25 mg total) by mouth every 8 (eight) hours as needed. 45 tablet 1   Insulin Aspart, w/Niacinamide, (FIASP) 100 UNIT/ML SOLN To be used with insulin pump.     Insulin Disposable Pump (OMNIPOD 5 G6 PODS, GEN 5,) MISC CHANGE POD EVERY OTHER DAY. 30 each 0   isosorbide mononitrate (IMDUR) 30 MG 24 hr tablet TAKE 1 TABLET(30 MG) BY MOUTH DAILY 90 tablet 1   JARDIANCE 25 MG TABS tablet Take 1 tablet (25 mg total) by mouth daily. 30 tablet 0   lisinopril-hydrochlorothiazide (ZESTORETIC) 20-12.5 MG tablet Take 1 tablet by mouth daily. 90 tablet 3   metoprolol succinate (TOPROL-XL) 100 MG 24 hr tablet Take 100 mg by mouth daily.     mometasone (NASONEX) 50 MCG/ACT nasal spray One spray in each nostril twice a day, use left hand for right nostril, and right hand for left nostril.  Please dispense one bottle. 1 g 6   montelukast (SINGULAIR) 10 MG tablet TAKE 1 TABLET(10 MG) BY MOUTH AT BEDTIME 30 tablet 3   mupirocin ointment (BACTROBAN) 2 %  Apply 1 Application topically daily.     nystatin ointment (MYCOSTATIN) Apply 1 Application topically 2 (two) times daily. 30 g 2   olopatadine (PATADAY) 0.1 % ophthalmic solution Place 1 drop into both eyes 2 (two) times daily. 5 mL 3   pantoprazole (PROTONIX) 40 MG tablet Take 1 tablet (40 mg total) by mouth daily. 30 tablet 3   pregabalin (LYRICA) 200 MG capsule TAKE 1 CAPSULE BY MOUTH TWICE DAILY 60 capsule 5   sucralfate (CARAFATE) 1 g tablet TAKE 1 TABLET(1 GRAM) BY MOUTH FOUR TIMES DAILY AT BEDTIME WITH MEALS 120 tablet 0   topiramate (TOPAMAX) 100 MG tablet Take by mouth 2 (two) times daily.     traZODone (DESYREL) 100 MG  tablet Take 1 tablet (100 mg total) by mouth at bedtime as needed for sleep. 90 tablet 0   nitroGLYCERIN (NITROSTAT) 0.4 MG SL tablet Place 0.4 mg under the tongue every 5 (five) minutes as needed for chest pain. (Patient not taking: Reported on 11/12/2022)     No current facility-administered medications for this visit.    Allergies  Allergen Reactions   Tramadol     Other reaction(s): Mental Status Changes (intolerance) Caused Hallucinations   Nsaids Other (See Comments)    Cannot take because she's on Plavix.   Other reaction(s): Bleeding (intolerance) Cannot take because she's on Plavix.    Sulfamethoxazole-Trimethoprim Nausea And Vomiting    History of Colitis   Ibuprofen     Not an allergy. Pt stated she can't take it because of current use of ASA and Plavix     Past Medical History:  Diagnosis Date   CAD (coronary artery disease)    Cervical radiculopathy 08/15/2017   CKD (chronic kidney disease), stage II    COVID-19 01/2022   DM (diabetes mellitus) (HCC)    HTN (hypertension)    Hyperlipidemia    Lumbar radiculopathy 06/21/2019   Last Assessment & Plan: Formatting of this note might be different from the original. Scheduled for laminectomy L5 4/27 Pre-op complete at hospital; labs and ekg reviewed. Scheduled for cardiology eval in one day Medical cleared for procedure by IM evaluation.   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 Severe tendinosis of the supraspinatus tendon.  2. Mild tendinosis of the infraspinatus tendon.  3. Thickening of the inferior joint capsule and intermediate signal  material effacing the normal subcoracoid fat as can be seen with  adhesive capsulitis.    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    Past Surgical History:  Procedure Laterality Date   APPENDECTOMY     BACK  SURGERY     CESAREAN SECTION     CORONARY ANGIOPLASTY WITH STENT PLACEMENT     MULTIPLE TOOTH EXTRACTIONS     ROTATOR CUFF REPAIR      Social History   Socioeconomic History   Marital status: Married    Spouse name: Not on file   Number of children: 4   Years of education: Not on file   Highest education level: Not on file  Occupational History   Not on file  Tobacco Use   Smoking status: Former    Types: Cigarettes   Smokeless tobacco: Never  Vaping Use   Vaping status: Never Used  Substance and Sexual Activity   Alcohol use: Never   Drug use: Never  Sexual activity: Not on file  Other Topics Concern   Not on file  Social History Narrative   Not on file   Social Determinants of Health   Financial Resource Strain: Not on file  Food Insecurity: Patient Declined (06/06/2022)   Received from [facility], [facility]   Hunger Vital Sign    Worried About Running Out of Food in the Last Year: Patient declined    Ran Out of Food in the Last Year: Patient declined  Transportation Needs: No Transportation Needs (06/10/2022)   Received from [employer], [facility]   PRAPARE - Transportation    Lack of Transportation (Medical): No    Lack of Transportation (Non-Medical): No  Physical Activity: Not on file  Stress: No Stress Concern Present (06/06/2022)   Received from [facility], [facility] - Occupational Stress Questionnaire    Feeling of Stress : Not at all  Social Connections: Unknown (04/23/2022)   Received from [facility], [facility]   Social Network    Social Network: Not on file  Intimate Partner Violence: Not At Risk (10/27/2022)   Received from [facility]   HITS    Over the last 12 months how often did your partner physically hurt you?: 1    Over the last 12 months how often did your partner insult you or talk down to you?: 1    Over the last 12 months how often did your partner threaten you with  physical harm?: 1    Over the last 12 months how often did your partner scream or curse at you?: 1    Family History  Problem Relation Age of Onset   Heart disease Mother    Heart disease Sister     ROS: no fevers or chills, productive cough, hemoptysis, dysphasia, odynophagia, melena, hematochezia, dysuria, hematuria, rash, seizure activity, orthopnea, PND, pedal edema, claudication. Remaining systems are negative.  Physical Exam:   Blood pressure 104/64, pulse 85, height 5\' 9"  (1.753 m), weight 287 lb (130.2 kg), SpO2 97%.  General:  Well developed/obese in NAD Skin warm/dry Patient not depressed No peripheral clubbing Back-normal HEENT-normal/normal eyelids Neck supple/normal carotid upstroke bilaterally; no bruits; no JVD; no thyromegaly chest - CTA/ normal expansion CV - RRR/normal S1 and S2; no murmurs, rubs or gallops;  PMI nondisplaced Abdomen -NT/ND, no HSM, no mass, + bowel sounds, no bruit 2+ femoral pulses, no bruits Ext-no edema, chords, 2+ DP Neuro-grossly nonfocal  ECG -Jul 20, 2022-normal sinus rhythm, right axis deviation.  Personally reviewed  A/P  1 coronary artery disease-she is not having exertional chest pain.  Plan to continue medical therapy.  Continue aspirin and Plavix.  She was told previously that she would require Plavix indefinitely for her previously placed for stents.  Continue statin.  2 hypertension-patient's blood pressure is controlled.  Continue present medical regimen.  3 hyperlipidemia-continue Lipitor at present dose.  Recent laboratory showed mildly elevated liver functions and I will therefore not advanced 80 mg daily.  We will check fasting lipids and liver.  We will adjust regimen as needed.  4 snoring-we will arrange evaluation with pulmonary for possible sleep apnea.  5 peripheral vascular disease-continue medical therapy.  She does complain of bilateral lower extremity pain.  I will arrange ABIs with Doppler.  Olga Millers,  MD

## 2022-11-12 NOTE — Patient Instructions (Signed)
    Lab Work:  Your physician recommends that you return for lab work in: 8 [facility]  If you have labs (blood work) drawn today and your tests are completely normal, you will receive your results only by: [employer] (if you have MyChart) OR A paper copy in the mail If you have any lab test that is abnormal or we need to change your treatment, we will call you to review the results.   Testing/Procedures:  Your physician has requested that you have an ankle brachial index (ABI). During this test an ultrasound and blood pressure cuff are used to evaluate the arteries that supply the arms and legs with blood. Allow thirty minutes for this exam. There are no restrictions or special instructions. [facility]   Follow-Up: At [facility], you and your health needs are our priority.  As part of our continuing mission to provide you with exceptional heart care, we have created designated Provider Care Teams.  These Care Teams include your primary Cardiologist (physician) and Advanced Practice Providers (APPs -  Physician Assistants and Nurse Practitioners) who all work together to provide you with the care you need, when you need it.  We recommend signing up for the patient portal called "MyChart".  Sign up information is provided on this After Visit Summary.  MyChart is used to connect with patients for Virtual Visits (Telemedicine).  Patients are able to view lab/test results, encounter notes, upcoming appointments, etc.  Non-urgent messages can be sent to your provider as well.   To learn more about what you can do with MyChart, go to [email or web address].    Your next appointment:   6 month(s)  Provider:   Olga Millers, MD [place]

## 2022-11-12 NOTE — Addendum Note (Signed)
Addended by: Freddi Starr on: 11/12/2022 04:25 PM   Modules accepted: Orders

## 2022-11-17 ENCOUNTER — Telehealth: Payer: Medicaid Other | Admitting: Physician Assistant

## 2022-11-17 DIAGNOSIS — T7840XA Allergy, unspecified, initial encounter: Secondary | ICD-10-CM

## 2022-11-17 NOTE — Progress Notes (Signed)
Message sent to patient requesting further input regarding current symptoms. Awaiting patient response.  

## 2022-11-17 NOTE — Progress Notes (Signed)
Because you were already given hydroxyzine for symptoms and not responding to that or benadryl, and your diabetes is uncontrolled where you need close monitoring and adjustment of medications so that you can take a steroid, I feel your condition warrants further evaluation and I recommend that you be seen in a face to face visit.   NOTE: There will be NO CHARGE for this eVisit   If you are having a true medical emergency please call 911.      For an urgent face to face visit, [place] has eight urgent care centers for your convenience:   NEW!! [facility] Health Urgent Care Center at [facility] Get Driving Directions [phone] [address] [place], [address]    [facility] Health Urgent Care Center at [facility] Get Driving Directions [phone] [address] [place], [place] [address]   [facility] Care Center [facility]) Get Driving Directions [phone] [address] [place], [place] [address]  [facility] Health Urgent Care Center [facility]) Get Driving Directions [phone] [address] [place],  [place]  [address]  [facility] Health Urgent Care Center [facility] - at [employer]  [phone] [address] [place],  [place] [address]   [facility] Health Urgent Care at [facility] Get Driving Directions [phone] [address] [place], [place] [address]   [facility] Health Urgent Care at [facility] Get Driving Directions  [phone] [address] [place], [place] [address]   [facility] Health Urgent Care at [facility] Directions [phone] [address] [place], [place] [address]  Your MyChart E-visit questionnaire answers were reviewed by a board certified advanced clinical practitioner to complete your personal care plan based on your specific symptoms.  Thank you for using e-Visits.

## 2022-11-18 ENCOUNTER — Ambulatory Visit (INDEPENDENT_AMBULATORY_CARE_PROVIDER_SITE_OTHER): Payer: Medicaid Other | Admitting: Family Medicine

## 2022-11-18 ENCOUNTER — Encounter: Payer: Self-pay | Admitting: Family Medicine

## 2022-11-18 VITALS — BP 87/57 | HR 69 | Temp 98.1°F | Resp 18 | Ht 69.0 in | Wt 279.0 lb

## 2022-11-18 DIAGNOSIS — B359 Dermatophytosis, unspecified: Secondary | ICD-10-CM | POA: Diagnosis not present

## 2022-11-18 DIAGNOSIS — I950 Idiopathic hypotension: Secondary | ICD-10-CM

## 2022-11-18 MED ORDER — KETOCONAZOLE 2 % EX SHAM
MEDICATED_SHAMPOO | CUTANEOUS | 1 refills | Status: AC
Start: 2022-11-18 — End: ?

## 2022-11-18 MED ORDER — TERBINAFINE HCL 250 MG PO TABS
250.0000 mg | ORAL_TABLET | Freq: Every day | ORAL | 0 refills | Status: DC
Start: 2022-11-18 — End: 2023-08-24

## 2022-11-18 NOTE — Progress Notes (Signed)
Acute Office Visit  Subjective:     Patient ID: Jean Calderon, female    DOB: 02-18-1971, 52 y.o.   MRN: [id number]  Chief Complaint  Patient presents with   Rash    Patient states that she has had what looks like an outbreak to her of little tiny red bumps , all over her body; face , legs, thighs, back , and both arms. She states that they itch very badly and she scratches until she bleeds    Rash  Patient is in today for acute visit.  Pt reports rash started while she was on vacation. She noticed the rash started to spread. They are large in some areas on her leg and smaller on her arms and back. They are very itchy. She has hydroxyzine 25mg  TID prn that she takes for itchy skin but the rash is new. She denies having any pets. No one in the home has these lesions.   Hypotension Pt also noted to have low blood pressures. When reviewing her bp medicines, she was unsure of Clonidine on her list. Have advised her to go home and check her bottles with medication list from today's visit. She will be in touch with me via mychart and will follow up on this.  Review of Systems  Skin:  Positive for itching and rash.  All other systems reviewed and are negative.       Objective:    BP (!) 87/57   Pulse 69   Temp 98.1 F (36.7 C) (Oral)   Resp 18   Ht 5\' 9"  (1.753 m)   Wt 279 lb (126.6 kg)   SpO2 96%   BMI 41.20 kg/m    Physical Exam Vitals and nursing note reviewed.  Constitutional:      Appearance: Normal appearance. She is obese.  HENT:     Head: Normocephalic and atraumatic.     Right Ear: External ear normal.     Left Ear: External ear normal.     Nose: Nose normal.  Cardiovascular:     Rate and Rhythm: Normal rate.  Pulmonary:     Effort: Pulmonary effort is normal.  Skin:    General: Skin is warm.     Findings: Lesion and rash present.     Comments: Multiple rounded macules with scaly borders on her legs and arms  Neurological:     General: No focal deficit  present.     Mental Status: She is alert and oriented to person, place, and time.    No results found for any visits on 11/18/22.      Assessment & Plan:   Problem List Items Addressed This Visit   None  Tinea -     Terbinafine HCl; Take 1 tablet (250 mg total) by mouth daily.  Dispense: 14 tablet; Refill: 0 -     Ketoconazole; Apply to affected areas 3 x a week for 8 weeks  Dispense: 120 mL; Refill: 1  Idiopathic hypotension   Lesions likely tinea. To treat with Ketoconazole shampoo 3 x a week and Terbinafine 250mg  daily x 14 days. Follow up if no better Pt to check medications at home with list and update on mychart. Will likely need to adjust blood pressure medications.  No orders of the defined types were placed in this encounter.   No follow-ups on file.  Suzan Slick, MD

## 2022-11-20 LAB — HM DIABETES EYE EXAM

## 2022-11-23 ENCOUNTER — Other Ambulatory Visit: Payer: Self-pay | Admitting: Family Medicine

## 2022-11-23 DIAGNOSIS — R0789 Other chest pain: Secondary | ICD-10-CM

## 2022-11-23 DIAGNOSIS — K30 Functional dyspepsia: Secondary | ICD-10-CM

## 2022-11-24 ENCOUNTER — Encounter: Payer: Self-pay | Admitting: Family Medicine

## 2022-11-24 ENCOUNTER — Ambulatory Visit (INDEPENDENT_AMBULATORY_CARE_PROVIDER_SITE_OTHER): Payer: Medicaid Other | Admitting: Family Medicine

## 2022-11-24 VITALS — BP 97/76 | HR 70 | Temp 97.6°F | Resp 18 | Ht 69.0 in | Wt 283.9 lb

## 2022-11-24 DIAGNOSIS — J309 Allergic rhinitis, unspecified: Secondary | ICD-10-CM | POA: Diagnosis not present

## 2022-11-24 DIAGNOSIS — Z23 Encounter for immunization: Secondary | ICD-10-CM

## 2022-11-24 DIAGNOSIS — L308 Other specified dermatitis: Secondary | ICD-10-CM

## 2022-11-24 DIAGNOSIS — I952 Hypotension due to drugs: Secondary | ICD-10-CM

## 2022-11-24 MED ORDER — HYDROXYZINE HCL 25 MG PO TABS: 25.0000 mg | ORAL_TABLET | Freq: Three times a day (TID) | ORAL | 1 refills | Status: DC | PRN

## 2022-11-24 NOTE — Progress Notes (Signed)
Established Patient Office Visit  Subjective   Patient ID: Jean Calderon, female    DOB: Apr 19, 1970  Age: 52 y.o. MRN: [id number]  Chief Complaint  Patient presents with   Medical Management of Chronic Issues    Patient is here for a 6 month follow up, Patient states that she has been feeling a little headed for the past two days and fell the other day    HPI  Pt is here for 6 month follow up. She reported a fall the other day after having dizziness. She denies injury. She is noted to have hypotension today. Have reviewed previous blood pressures and they have mostly been low with SBP in the 100s. Denies chest pains or SOB.  Pt also has hx of allergic rhinitis and pruritus. Needs refills on her Atarax that she uses prn for itching. She continues to have recurrent nasal congestion despite using nasal steroid spray and Singulair. Allergy symptoms exacerbating and not relieved with current treatment.   She would like flu vaccine today.  Review of Systems  All other systems reviewed and are negative.    Objective:     BP 97/76   Pulse 70   Temp 97.6 F (36.4 C) (Oral)   Resp 18   Ht 5\' 9"  (1.753 m)   Wt 283 lb 14.4 oz (128.8 kg)   SpO2 96%   BMI 41.92 kg/m  BP Readings from Last 3 Encounters:  11/24/22 97/76  11/18/22 (!) 87/57  11/12/22 104/64      Physical Exam Vitals and nursing note reviewed.  Constitutional:      Appearance: Normal appearance. She is normal weight.  HENT:     Head: Normocephalic and atraumatic.     Right Ear: External ear normal.     Left Ear: External ear normal.     Nose: Nose normal.     Mouth/Throat:     Mouth: Mucous membranes are moist.     Pharynx: Oropharynx is clear.  Eyes:     Conjunctiva/sclera: Conjunctivae normal.     Pupils: Pupils are equal, round, and reactive to light.  Cardiovascular:     Rate and Rhythm: Normal rate.  Pulmonary:     Effort: Pulmonary effort is normal.  Skin:    General: Skin is warm.     Capillary  Refill: Capillary refill takes less than 2 seconds.  Neurological:     General: No focal deficit present.     Mental Status: She is alert and oriented to person, place, and time. Mental status is at baseline.     Gait: Gait normal.  Psychiatric:        Mood and Affect: Mood normal.        Behavior: Behavior normal.        Thought Content: Thought content normal.        Judgment: Judgment normal.    No results found for any visits on 11/24/22.  Last CBC Lab Results  Component Value Date   WBC 9.2 07/20/2022   HGB 14.1 07/20/2022   HCT 45.9 07/20/2022   MCV 83.8 07/20/2022   MCH 25.7 (L) 07/20/2022   RDW 14.1 07/20/2022   PLT 194 07/20/2022   Last metabolic panel Lab Results  Component Value Date   GLUCOSE 254 (H) 07/20/2022   NA 133 (L) 07/20/2022   K 4.4 07/20/2022   CL 98 07/20/2022   CO2 20 (L) 07/20/2022   BUN 24 (H) 07/20/2022   CREATININE 1.16 (H) 07/20/2022  GFRNONAA 56 (L) 07/20/2022   CALCIUM 9.4 07/20/2022   PROT 7.5 07/20/2022   ALBUMIN 3.8 07/20/2022   LABGLOB 2.9 02/04/2022   AGRATIO 1.5 02/04/2022   BILITOT 1.0 07/20/2022   ALKPHOS 69 07/20/2022   AST 55 (H) 07/20/2022   ALT 55 (H) 07/20/2022   ANIONGAP 15 07/20/2022      The 10-year ASCVD risk score (Arnett DK, et al., 2019) is: 5.5%    Assessment & Plan:   Problem List Items Addressed This Visit   None Visit Diagnoses     Hypotension due to drugs    -  Primary     Hypotension due to drugs  Need for influenza vaccination -     Flu vaccine trivalent PF, 6mos and older(Flulaval,Afluria,Fluarix,Fluzone)  Allergic rhinitis, unspecified seasonality, unspecified trigger -     Ambulatory referral to Allergy  Pruritic dermatitis -     hydrOXYzine HCl; Take 1 tablet (25 mg total) by mouth every 8 (eight) hours as needed.  Dispense: 45 tablet; Refill: 1   Pt with low blood pressures. Will discontinue her Zestoretic 20/12.5mg  daily. To continue Metoprolol 100mg  and Imdur 30 mg daily for now.  Recheck in 3 weeks. Flu vaccine today Refilled Hydroxyzine 25mg  TID prn for itching and due to uncontrolled allergic rhinitis, refer to Allergist.    No follow-ups on file.    Suzan Slick, MD

## 2022-11-25 ENCOUNTER — Ambulatory Visit (HOSPITAL_COMMUNITY)
Admission: RE | Admit: 2022-11-25 | Payer: Medicaid Other | Source: Ambulatory Visit | Attending: Cardiology | Admitting: Cardiology

## 2022-11-26 ENCOUNTER — Other Ambulatory Visit: Payer: Self-pay | Admitting: Family Medicine

## 2022-11-26 DIAGNOSIS — F5101 Primary insomnia: Secondary | ICD-10-CM

## 2022-11-29 NOTE — Telephone Encounter (Signed)
Denied, sent 90 day supply on 10/18/22

## 2022-11-30 ENCOUNTER — Ambulatory Visit: Payer: BLUE CROSS/BLUE SHIELD | Admitting: Gastroenterology

## 2022-12-06 ENCOUNTER — Other Ambulatory Visit: Payer: Self-pay | Admitting: Family Medicine

## 2022-12-06 ENCOUNTER — Encounter: Payer: Self-pay | Admitting: Family Medicine

## 2022-12-06 DIAGNOSIS — L308 Other specified dermatitis: Secondary | ICD-10-CM

## 2022-12-06 MED ORDER — ESTRADIOL-NORETHINDRONE ACET 0.5-0.1 MG PO TABS: 1.0000 | ORAL_TABLET | Freq: Every day | ORAL | 0 refills | Status: AC

## 2022-12-06 MED ORDER — MUPIROCIN 2 % EX OINT
1.0000 | TOPICAL_OINTMENT | Freq: Every day | CUTANEOUS | 3 refills | Status: DC
Start: 2022-12-06 — End: 2023-07-21

## 2022-12-06 MED ORDER — CHLORHEXIDINE GLUCONATE 4 % EX SOLN: Freq: Every day | CUTANEOUS | 1 refills | Status: DC | PRN

## 2022-12-08 ENCOUNTER — Encounter
Payer: Medicaid Other | Attending: Physical Medicine and Rehabilitation | Admitting: Physical Medicine and Rehabilitation

## 2022-12-08 ENCOUNTER — Other Ambulatory Visit: Payer: Self-pay | Admitting: Family Medicine

## 2022-12-08 DIAGNOSIS — G8929 Other chronic pain: Secondary | ICD-10-CM | POA: Insufficient documentation

## 2022-12-08 DIAGNOSIS — M545 Low back pain, unspecified: Secondary | ICD-10-CM | POA: Insufficient documentation

## 2022-12-08 DIAGNOSIS — G894 Chronic pain syndrome: Secondary | ICD-10-CM | POA: Insufficient documentation

## 2022-12-08 DIAGNOSIS — E1142 Type 2 diabetes mellitus with diabetic polyneuropathy: Secondary | ICD-10-CM | POA: Insufficient documentation

## 2022-12-14 ENCOUNTER — Ambulatory Visit (HOSPITAL_COMMUNITY)
Admission: RE | Admit: 2022-12-14 | Payer: Medicaid Other | Source: Ambulatory Visit | Attending: Cardiology | Admitting: Cardiology

## 2022-12-15 ENCOUNTER — Ambulatory Visit: Payer: BLUE CROSS/BLUE SHIELD | Admitting: Cardiology

## 2022-12-15 ENCOUNTER — Encounter (HOSPITAL_COMMUNITY): Payer: Self-pay

## 2022-12-27 ENCOUNTER — Telehealth: Payer: Self-pay | Admitting: Family Medicine

## 2022-12-27 NOTE — Telephone Encounter (Signed)
Prescription Request  12/27/2022  LOV: 11/24/2022  What is the name of the medication or equipment?  traZODone (DESYREL) 100 MG tablet  Have you contacted your pharmacy to request a refill? Yes   Which pharmacy would you like this sent to?  [id number] - [place], [place] - [address]  [place] [address] Phone: [phone] Fax: [phone]  Patient notified that their request is being sent to the clinical staff for review and that they should receive a response within 2 business days.   Please advise at Mobile [phone] (mobile)   Patient also states that rash she has is not getting better and is getting worse. Patient seeking direction from PCP. Please advise. Katha Hamming

## 2022-12-28 ENCOUNTER — Other Ambulatory Visit: Payer: Self-pay

## 2022-12-28 ENCOUNTER — Encounter: Payer: Self-pay | Admitting: Family Medicine

## 2022-12-28 DIAGNOSIS — F5101 Primary insomnia: Secondary | ICD-10-CM

## 2022-12-28 MED ORDER — TRAZODONE HCL 100 MG PO TABS: 100.0000 mg | ORAL_TABLET | Freq: Every evening | ORAL | 0 refills | Status: DC | PRN

## 2022-12-28 NOTE — Telephone Encounter (Signed)
Patient is requesting refill on pending medication

## 2022-12-28 NOTE — Telephone Encounter (Signed)
Patient calling in to inquire about the status of this. Patient requesting follow-up. Jean Calderon

## 2023-01-03 ENCOUNTER — Encounter: Payer: Self-pay | Admitting: Family Medicine

## 2023-01-05 ENCOUNTER — Telehealth: Payer: Self-pay | Admitting: Family Medicine

## 2023-01-05 ENCOUNTER — Encounter: Payer: Self-pay | Admitting: Family Medicine

## 2023-01-05 NOTE — Telephone Encounter (Signed)
Pt sent me ADA accommodation form for work through her mychart yesterday. This has been completed and in completed forms' box at nurse's desk Pt states she will come on Thursday to pay fee and pick up forms.

## 2023-01-26 ENCOUNTER — Ambulatory Visit: Payer: Medicaid Other | Admitting: Family Medicine

## 2023-01-26 ENCOUNTER — Ambulatory Visit: Payer: Self-pay | Admitting: Family Medicine

## 2023-01-26 ENCOUNTER — Encounter: Payer: Self-pay | Admitting: Emergency Medicine

## 2023-01-26 ENCOUNTER — Ambulatory Visit
Admission: EM | Admit: 2023-01-26 | Discharge: 2023-01-26 | Disposition: A | Discharge status: Home / Self Care | Payer: BLUE CROSS/BLUE SHIELD | Attending: Family Medicine | Admitting: Family Medicine

## 2023-01-26 DIAGNOSIS — R6889 Other general symptoms and signs: Secondary | ICD-10-CM

## 2023-01-26 LAB — POC SARS CORONAVIRUS 2 AG -  ED: SARS Coronavirus 2 Ag: NEGATIVE

## 2023-01-26 LAB — POCT INFLUENZA A/B
Influenza A, POC: NEGATIVE
Influenza B, POC: NEGATIVE

## 2023-01-26 MED ORDER — BENZONATATE 200 MG PO CAPS: 200.0000 mg | ORAL_CAPSULE | Freq: Three times a day (TID) | ORAL | 0 refills | Status: DC | PRN

## 2023-01-26 MED ORDER — FLUTICASONE PROPIONATE 50 MCG/ACT NA SUSP: 2.0000 | Freq: Every day | NASAL | 0 refills | Status: DC

## 2023-01-26 NOTE — ED Provider Notes (Signed)
[facility]    CSN: [id number] Arrival date & time: 01/26/23  1622      History   Chief Complaint Chief Complaint  Patient presents with   URI    HPI Jean Calderon is a 52 y.o. female.   Patient states she woke up with cold symptoms this morning.  She is gotten worse as the day progressed.  She states she is not feeling well with a lot of nasal congestion postnasal drip sinus congestion and cough.  She feels achy and tired.  She has a headache.  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  She states that she has diabetes, high blood pressure, and heart disease and does not know what cold medicines are safe for her to take    Past Medical History:  Diagnosis Date   CAD (coronary artery disease)    Cervical radiculopathy 08/15/2017   CKD (chronic kidney disease), stage II    COVID-19 01/2022   DM (diabetes mellitus) (HCC)    HTN (hypertension)    Hyperlipidemia    Lumbar radiculopathy 06/21/2019   Last Assessment & Plan: Formatting of this note might be different from the original. Scheduled for laminectomy L5 4/27 Pre-op complete at hospital; labs and ekg reviewed. Scheduled for cardiology eval in one day Medical cleared for procedure by IM evaluation.   Malignant hyperthermia    Myocardial infarction [place])    Neuropathy    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   1. Severe tendinosis of the supraspinatus tendon.  2. Mild tendinosis of the infraspinatus tendon.  3. Thickening of the inferior joint capsule and intermediate signal  material effacing the normal subcoracoid fat as can be seen with  adhesive capsulitis.    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    Patient Active Problem List   Diagnosis Date Noted   Chronic midline low back pain 07/19/2022   History of lumbar fusion 07/19/2022   Chronic left SI joint  pain 07/19/2022   Numbness and tingling of left leg 07/19/2022   Chronic pain syndrome 07/19/2022   Uncontrolled type 1 diabetes mellitus with hyperglycemia, with long-term current use of insulin (HCC) 06/23/2022   CKD stage G3b/A3, GFR 30-44 and albumin creatinine ratio >300 mg/g (HCC) 06/06/2022   Transaminitis 06/06/2022   Acute pancreatitis 06/06/2022   Ulnar neuropathy of both upper extremities 12/23/2021   Hyperlipidemia 10/01/2021   Diabetic neuropathy (HCC) 09/30/2021   Meralgia paresthetica, right lower limb 09/30/2021   Gait instability 07/30/2021   Migraine without status migrainosus, not intractable 07/30/2021   Poorly controlled type 2 diabetes mellitus with peripheral neuropathy (HCC) 06/05/2021   Menopausal symptoms 04/15/2021   Anxiety 03/25/2021   CAD (coronary artery disease)    HTN (hypertension)    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   Heart valve disease 01/06/2021   Vitamin B12 deficiency 12/03/2020   Vitamin D deficiency 12/03/2020   Chronic left shoulder pain 11/25/2020   Generalized anxiety disorder 11/25/2020   Psoriasis 01/01/2020   Lumbar radiculopathy 06/21/2019   Uterine leiomyoma 03/06/2019   Depression with anxiety 02/02/2018   Primary insomnia 02/02/2018   Class 2 severe obesity due to excess calories with serious comorbidity and body mass index (BMI) of 38.0 to 38.9 in adult [facility]) 10/12/2017   Insulin pump in place 10/12/2017   Cervical radiculopathy 08/15/2017   Family history of early  CAD 07/21/2017   Obstructive sleep apnea syndrome 07/21/2017   Chronic right shoulder pain 05/30/2017   Type 2 diabetes mellitus with mild nonproliferative diabetic retinopathy without macular edema, bilateral (HCC) 03/18/2017   Congestive heart failure (HCC) 03/16/2017   Other cervical disc degeneration, unspecified cervical region 11/11/2016   Adhesive capsulitis of right shoulder 10/08/2016   GERD (gastroesophageal reflux disease) 07/19/2016   Spinal  stenosis of cervical region 12/01/2015   Cervical disc herniation 12/01/2015   Bilateral carpal tunnel syndrome 11/04/2015   Lesion of ulnar nerve, left upper limb 11/04/2015   Stented coronary artery 07/07/2015    Past Surgical History:  Procedure Laterality Date   APPENDECTOMY     BACK SURGERY     CESAREAN SECTION     CORONARY ANGIOPLASTY WITH STENT PLACEMENT     MULTIPLE TOOTH EXTRACTIONS     ROTATOR CUFF REPAIR      OB History   No obstetric history on file.      Home Medications    Prior to Admission medications   Medication Sig Start Date End Date Taking? Authorizing Provider  acetaminophen (TYLENOL) 325 MG tablet Take 2 tablets (650 mg total) by mouth every 6 (six) hours as needed for moderate pain. 04/09/22  Yes Wallis Bamberg, PA-C  albuterol (VENTOLIN HFA) 108 (90 Base) MCG/ACT inhaler Inhale 2 puffs into the lungs every 4 (four) hours as needed for shortness of breath or wheezing. 01/22/22  Yes Lattie Haw, MD  aspirin EC 81 MG tablet Take 81 mg by mouth daily.   Yes [provider]  atorvastatin (LIPITOR) 40 MG tablet Take 1 tablet (40 mg total) by mouth daily. 06/14/22  Yes Rucker, Magdalen Spatz, MD  benzonatate (TESSALON) 200 MG capsule Take 1 capsule (200 mg total) by mouth 3 (three) times daily as needed for cough. 01/26/23  Yes Eustace Moore, MD  chlorhexidine (HIBICLENS) 4 % external liquid Apply topically daily as needed. 12/06/22  Yes Rucker, Magdalen Spatz, MD  clopidogrel (PLAVIX) 75 MG tablet TAKE 1 TABLET(75 MG) BY MOUTH DAILY 11/09/22  Yes Rucker, Magdalen Spatz, MD  Continuous Blood Gluc Receiver (DEXCOM G7 RECEIVER) DEVI Check sugars daily Dx E11.9 04/05/22  Yes Rucker, Magdalen Spatz, MD  Continuous Blood Gluc Sensor (DEXCOM G7 SENSOR) MISC Check sugars daily Dx E11.9 04/05/22  Yes Rucker, Magdalen Spatz, MD  Continuous Glucose Transmitter (DEXCOM G6 TRANSMITTER) MISC  07/01/22  Yes [provider]  Crisaborole (EUCRISA) 2 % OINT Apply 1 Application topically 2  (two) times daily. 06/11/20  Yes [provider]  Estradiol-Norethindrone Acet 0.5-0.1 MG tablet Take 1 tablet by mouth daily. 12/06/22  Yes Rucker, Magdalen Spatz, MD  fexofenadine (ALLEGRA) 180 MG tablet Take 1 tablet (180 mg total) by mouth daily. 05/06/22  Yes Rucker, Magdalen Spatz, MD  glipiZIDE (GLUCOTROL XL) 5 MG 24 hr tablet Take 5 mg by mouth daily with breakfast.   Yes [provider]  HUMALOG 100 UNIT/ML injection INJECT 25 UNITS INTO THE SKIN 3 TIMES DAILY BEFORE MEALS. USE INSULIN PUMP 05/12/22  Yes Rucker, Magdalen Spatz, MD  hydrOXYzine (ATARAX) 25 MG tablet Take 1 tablet (25 mg total) by mouth every 8 (eight) hours as needed. 11/24/22  Yes Rucker, Magdalen Spatz, MD  Insulin Aspart, w/Niacinamide, (FIASP) 100 UNIT/ML SOLN To be used with insulin pump. 07/08/22  Yes [provider]  Insulin Disposable Pump (OMNIPOD 5 G6 PODS, GEN 5,) MISC CHANGE POD EVERY OTHER DAY. 06/07/22  Yes Rucker, Magdalen Spatz, MD  isosorbide mononitrate (IMDUR) 30 MG 24 hr tablet TAKE 1 TABLET(30 MG) BY MOUTH DAILY 08/10/22  Yes Rucker, Magdalen Spatz, MD  JARDIANCE 25 MG TABS tablet Take 1 tablet (25 mg total) by mouth daily. 06/15/22  Yes Rucker, Magdalen Spatz, MD  ketoconazole (NIZORAL) 2 % shampoo Apply to affected areas 3 x a week for 8 weeks 11/18/22  Yes Rucker, Magdalen Spatz, MD  metoprolol succinate (TOPROL-XL) 100 MG 24 hr tablet Take 1 tablet (100 mg total) by mouth daily. 11/12/22  Yes Lewayne Bunting, MD  mometasone (NASONEX) 50 MCG/ACT nasal spray One spray in each nostril twice a day, use left hand for right nostril, and right hand for left nostril.  Please dispense one bottle. 05/24/22  Yes Rucker, Magdalen Spatz, MD  montelukast (SINGULAIR) 10 MG tablet TAKE 1 TABLET(10 MG) BY MOUTH AT BEDTIME 10/26/22  Yes Rucker, Magdalen Spatz, MD  mupirocin ointment (BACTROBAN) 2 % Apply 1 Application topically daily. 12/06/22  Yes Rucker, Magdalen Spatz, MD  nitroGLYCERIN (NITROSTAT) 0.4 MG SL tablet Place 1 tablet (0.4 mg total) under the  tongue every 5 (five) minutes as needed for chest pain. 11/12/22  Yes Lewayne Bunting, MD  nystatin ointment (MYCOSTATIN) Apply 1 Application topically 2 (two) times daily. 07/22/22  Yes Rucker, Magdalen Spatz, MD  olopatadine (PATADAY) 0.1 % ophthalmic solution Place 1 drop into both eyes 2 (two) times daily. 05/24/22  Yes Rucker, Magdalen Spatz, MD  pantoprazole (PROTONIX) 40 MG tablet Take 1 tablet (40 mg total) by mouth daily. 07/22/22  Yes Rucker, Magdalen Spatz, MD  pregabalin (LYRICA) 200 MG capsule TAKE 1 CAPSULE BY MOUTH TWICE DAILY 11/09/22  Yes Rucker, Magdalen Spatz, MD  sucralfate (CARAFATE) 1 g tablet TAKE 1 TABLET(1 GRAM) BY MOUTH FOUR TIMES DAILY AT BEDTIME WITH MEALS 11/24/22  Yes Rucker, Magdalen Spatz, MD  terbinafine (LAMISIL) 250 MG tablet Take 1 tablet (250 mg total) by mouth daily. 11/18/22  Yes Rucker, Magdalen Spatz, MD  topiramate (TOPAMAX) 100 MG tablet Take by mouth 2 (two) times daily. 08/17/21  Yes [provider]  traZODone (DESYREL) 100 MG tablet Take 1 tablet (100 mg total) by mouth at bedtime as needed for sleep. 12/28/22  Yes Rucker, Magdalen Spatz, MD    Family History Family History  Problem Relation Age of Onset   Heart disease Mother    Heart disease Sister     Social History Social History   Tobacco Use   Smoking status: Former    Types: Cigarettes   Smokeless tobacco: Never  Vaping Use   Vaping status: Never Used  Substance Use Topics   Alcohol use: Never   Drug use: Never     Allergies   Tramadol, Nsaids, Sulfamethoxazole-trimethoprim, and Ibuprofen   Review of Systems Review of Systems See HPI  Physical Exam Triage Vital Signs ED Triage Vitals  Encounter Vitals Group     BP 01/26/23 1632 98/65     Systolic BP Percentile --      Diastolic BP Percentile --      Pulse Rate 01/26/23 1632 85     Resp 01/26/23 1632 16     Temp 01/26/23 1632 97.7 F (36.5 C)     Temp Source 01/26/23 1632 Oral     SpO2 01/26/23 1632 99 %     Weight 01/26/23 1634 275 lb (124.7  kg)     Height 01/26/23 1634 5\' 9"  (1.753 m)     Head Circumference --      Peak  Flow --      Pain Score 01/26/23 1634 9     Pain Loc --      Pain Education --      Exclude from Growth Chart --    No data found.  Updated Vital Signs BP 98/65 (BP Location: Right Arm)   Pulse 85   Temp 97.7 F (36.5 C) (Oral)   Resp 16   Ht 5\' 9"  (1.753 m)   Wt 124.7 kg   SpO2 99%   BMI 40.61 kg/m       Physical Exam Constitutional:      General: She is not in acute distress.    Appearance: She is well-developed. She is obese. She is ill-appearing.  HENT:     Head: Normocephalic and atraumatic.     Right Ear: Tympanic membrane normal.     Left Ear: Tympanic membrane normal.     Nose: Congestion present. No rhinorrhea.     Mouth/Throat:     Mouth: Mucous membranes are moist.     Pharynx: No posterior oropharyngeal erythema.  Eyes:     Conjunctiva/sclera: Conjunctivae normal.     Pupils: Pupils are equal, round, and reactive to light.  Cardiovascular:     Rate and Rhythm: Normal rate and regular rhythm.     Heart sounds: Normal heart sounds.  Pulmonary:     Effort: Pulmonary effort is normal. No respiratory distress.     Breath sounds: Normal breath sounds.  Abdominal:     General: There is no distension.     Palpations: Abdomen is soft.  Musculoskeletal:        General: Normal range of motion.     Cervical back: Normal range of motion.  Lymphadenopathy:     Cervical: No cervical adenopathy.  Skin:    General: Skin is warm and dry.  Neurological:     Mental Status: She is alert.      UC Treatments / Results  Labs (all labs ordered are listed, but only abnormal results are displayed) Labs Reviewed  POCT INFLUENZA A/B  POC SARS CORONAVIRUS 2 AG -  ED    EKG   Radiology No results found.  Procedures Procedures (including critical care time)  Medications Ordered in UC Medications - No data to display  Initial Impression / Assessment and Plan / UC Course  I  have reviewed the triage vital signs and the nursing notes.  Pertinent labs & imaging results that were available during my care of the patient were reviewed by me and considered in my medical decision making (see chart for details).     I explained to the patient that the COVID and flu testing are the least accurate in the first 24 hours of disease.  I explained that if she continues to feel poorly, she should get a flu and COVID test to take at home in a couple of days. Final Clinical Impressions(s) / UC Diagnoses   Final diagnoses:  Flu-like symptoms     Discharge Instructions      Use your mometasone (Nasonex) nasal spray.  If this is not working then you can try Flonase in its place. Drink lots of fluids Take Tessalon 2-3 times a day as needed for cough Get your lots of rest Your COVID test and your flu tests are both negative Take Tylenol for body aches, pain, fever   ED Prescriptions     Medication Sig Dispense Auth. Provider   fluticasone (FLONASE) 50 MCG/ACT nasal spray  (  Status: Discontinued) Place 2 sprays into both nostrils daily. 16 g Eustace Moore, MD   benzonatate (TESSALON) 200 MG capsule Take 1 capsule (200 mg total) by mouth 3 (three) times daily as needed for cough. 21 capsule Eustace Moore, MD      PDMP not reviewed this encounter.   Eustace Moore, MD 01/26/23 [phone]

## 2023-01-26 NOTE — Telephone Encounter (Signed)
Reason for Disposition . [1] MILD difficulty breathing (e.g., minimal/no SOB at rest, SOB with walking, pulse <100) AND [2] NEW-onset or WORSE than normal  Protocols used: Breathing Difficulty-A-AH

## 2023-01-26 NOTE — Discharge Instructions (Addendum)
Use your mometasone (Nasonex) nasal spray.  If this is not working then you can try Flonase in its place. Drink lots of fluids Take Tessalon 2-3 times a day as needed for cough Get your lots of rest Your COVID test and your flu tests are both negative Take Tylenol for body aches, pain, fever

## 2023-01-26 NOTE — Telephone Encounter (Addendum)
Copied from CRM [id number]. Topic: Clinical - Red Word Triage >> Jan 26, 2023  2:15 PM Colletta Maryland S wrote: Red Word that prompted transfer to Nurse Triage: pt states its hard to breathe through mouth and nose, stuffy nose, head hurts, eyes running Chief Complaint:  Patient reports  hard to breathe through stuffy nose and mouth. Head hurts and eyes running.  Patient states she has asthma but does not   an inhaler. Patient went to work today. Symptoms:   hard to breathe through stuffy nose and mouth. Head hurts and eyes running.   Frequency: Woke up this morning feeling bad and went to work. Patient denies any chest pain. Disposition: [] ED /[] Urgent Care (no appt availability in office) / [x] Appointment(In office/virtual)/ []  [place] [facility]/ [] Home Care/ [] Refused Recommended Disposition /[] [place] [facility]/ []  Follow-up with PCP Additional Notes:  Patient advised if her breathing get any worse than to got to Urgent or Emergency Department. Patient verbalized understanding.

## 2023-01-26 NOTE — ED Triage Notes (Signed)
Patient states she woke up this morning not feeling well.  Head and nasal congestion, feeling fatigue, cough and SOB.  Patient denies any OTC cold meds.

## 2023-02-13 ENCOUNTER — Other Ambulatory Visit: Payer: Self-pay | Admitting: Family Medicine

## 2023-02-13 DIAGNOSIS — F5101 Primary insomnia: Secondary | ICD-10-CM

## 2023-02-14 ENCOUNTER — Other Ambulatory Visit: Payer: Self-pay | Admitting: Family Medicine

## 2023-02-14 ENCOUNTER — Telehealth: Payer: Self-pay | Admitting: Family Medicine

## 2023-02-14 ENCOUNTER — Encounter: Payer: Self-pay | Admitting: Family Medicine

## 2023-02-14 DIAGNOSIS — I509 Heart failure, unspecified: Secondary | ICD-10-CM

## 2023-02-14 DIAGNOSIS — I2510 Atherosclerotic heart disease of native coronary artery without angina pectoris: Secondary | ICD-10-CM

## 2023-02-14 DIAGNOSIS — L308 Other specified dermatitis: Secondary | ICD-10-CM

## 2023-02-14 DIAGNOSIS — F5101 Primary insomnia: Secondary | ICD-10-CM

## 2023-02-14 MED ORDER — CLOPIDOGREL BISULFATE 75 MG PO TABS: 75.0000 mg | ORAL_TABLET | Freq: Every day | ORAL | 1 refills | Status: DC

## 2023-02-14 MED ORDER — HYDROXYZINE HCL 25 MG PO TABS: 25.0000 mg | ORAL_TABLET | Freq: Three times a day (TID) | ORAL | 1 refills | Status: DC | PRN

## 2023-02-14 NOTE — Telephone Encounter (Signed)
I have refilled her medicines to [facility] and placed dermatology referral.

## 2023-02-14 NOTE — Telephone Encounter (Signed)
Patient requesting referral to dermatology, states is not any better and it continues to spread, is almost full-body now. Please advise. Katha Hamming  Prescription Request  02/14/2023  LOV: 11/24/2022  What is the name of the medication or equipment?  traZODone (DESYREL) 100 MG tablet  hydrOXYzine (ATARAX) 25 MG tablet   clopidogrel (PLAVIX) 75 MG tablet   Have you contacted your pharmacy to request a refill? Yes   Which pharmacy would you like this sent to?  [id number] - [place], [place] - [address] [place] [address] Phone: [phone] Fax: [phone]   Patient notified that their request is being sent to the clinical staff for review and that they should receive a response within 2 business days.   Please advise at Mobile [phone] (mobile)

## 2023-02-15 ENCOUNTER — Telehealth: Payer: Self-pay

## 2023-02-15 ENCOUNTER — Encounter: Payer: Self-pay | Admitting: Family Medicine

## 2023-02-15 ENCOUNTER — Ambulatory Visit: Payer: Medicaid Other | Admitting: Family Medicine

## 2023-02-15 NOTE — Telephone Encounter (Signed)
Patient has an appointment 02/15/23 at 10:50am

## 2023-02-17 ENCOUNTER — Other Ambulatory Visit: Payer: Self-pay | Admitting: Family Medicine

## 2023-02-17 DIAGNOSIS — F418 Other specified anxiety disorders: Secondary | ICD-10-CM

## 2023-02-17 DIAGNOSIS — I509 Heart failure, unspecified: Secondary | ICD-10-CM

## 2023-02-18 ENCOUNTER — Ambulatory Visit: Payer: Medicaid Other | Admitting: Podiatry

## 2023-02-21 NOTE — Telephone Encounter (Signed)
Denied Trazodone. This is old rx and dose has been changed to 100mg  at night. Recently sent on 12/16 for 90 tabs

## 2023-02-24 ENCOUNTER — Ambulatory Visit: Payer: Medicaid Other | Admitting: Podiatry

## 2023-02-24 ENCOUNTER — Encounter: Payer: Self-pay | Admitting: Podiatry

## 2023-02-24 DIAGNOSIS — M79671 Pain in right foot: Secondary | ICD-10-CM

## 2023-02-24 DIAGNOSIS — E1149 Type 2 diabetes mellitus with other diabetic neurological complication: Secondary | ICD-10-CM

## 2023-02-24 DIAGNOSIS — B351 Tinea unguium: Secondary | ICD-10-CM | POA: Diagnosis not present

## 2023-02-24 DIAGNOSIS — M79672 Pain in left foot: Secondary | ICD-10-CM | POA: Diagnosis not present

## 2023-02-24 NOTE — Progress Notes (Signed)
  Subjective:  Patient ID: Jean Calderon, female    DOB: 12/05/1970,   MRN: [id number]  No chief complaint on file.   52 y.o. female presents for concern of thickened elongated and painful nails that are difficult to trim. Requesting to have them trimmed today. Relates burning and tingling in their feet. Patient is diabetic and last A1c was  Lab Results  Component Value Date   HGBA1C 14.0 (A) 05/06/2022   .   PCP:  Suzan Slick, MD    . Denies any other pedal complaints. Denies n/v/f/c.  Still having lots of foot pain and never was able to see neurologist.   Past Medical History:  Diagnosis Date   CAD (coronary artery disease)    Cervical radiculopathy 08/15/2017   CKD (chronic kidney disease), stage II    COVID-19 01/2022   DM (diabetes mellitus) (HCC)    HTN (hypertension)    Hyperlipidemia    Lumbar radiculopathy 06/21/2019   Last Assessment & Plan: Formatting of this note might be different from the original. Scheduled for laminectomy L5 4/27 Pre-op complete at hospital; labs and ekg reviewed. Scheduled for cardiology eval in one day Medical cleared for procedure by IM evaluation.   Malignant hyperthermia    Myocardial infarction [facility])    Neuropathy    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   1. Severe tendinosis of the supraspinatus tendon.  2. Mild tendinosis of the infraspinatus tendon.  3. Thickening of the inferior joint capsule and intermediate signal  material effacing the normal subcoracoid fat as can be seen with  adhesive capsulitis.    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    Objective:  Physical Exam: Vascular: DP/PT pulses 2/4 bilateral. CFT <3 seconds. Absent hair growth on digits. Edema noted to bilateral lower extremities. Xerosis noted bilaterally.  Skin. No lacerations or abrasions bilateral feet. Nails 1-5 bilateral  are thickened discolored and elongated  with subungual debris.  Musculoskeletal: MMT 5/5 bilateral lower extremities in DF, PF, Inversion and Eversion. Deceased ROM in DF of ankle joint.  Neurological: Sensation intact to light touch. Protective sensation diminished bilateral.    Assessment:   1. Dermatophytosis of nail   2. Pain in both feet   3. Type II diabetes mellitus with neurological manifestations (HCC)       Plan:  Patient was evaluated and treated and all questions answered. -Discussed and educated patient on diabetic foot care, especially with  regards to the vascular, neurological and musculoskeletal systems.  -Stressed the importance of good glycemic control and the detriment of not  controlling glucose levels in relation to the foot. -Discussed supportive shoes at all times and checking feet regularly.  -Mechanically debrided all nails 1-5 bilateral using sterile nail nipper and filed with dremel without incident  -Patient will be following up with neurologist soon and will be looking into spinal nerve stimulator.  -Answered all patient questions -Patient to return  in 3 months for at risk foot care -Patient advised to call the office if any problems or questions arise in the meantime.   Louann Sjogren, DPM

## 2023-02-26 ENCOUNTER — Other Ambulatory Visit: Payer: Self-pay

## 2023-02-26 ENCOUNTER — Emergency Department (HOSPITAL_BASED_OUTPATIENT_CLINIC_OR_DEPARTMENT_OTHER)
Admission: EM | Admit: 2023-02-26 | Discharge: 2023-02-26 | Disposition: A | Discharge status: Home / Self Care | Payer: BLUE CROSS/BLUE SHIELD

## 2023-02-26 ENCOUNTER — Encounter (HOSPITAL_BASED_OUTPATIENT_CLINIC_OR_DEPARTMENT_OTHER): Payer: Self-pay

## 2023-02-26 ENCOUNTER — Telehealth: Payer: Self-pay | Admitting: Physician Assistant

## 2023-02-26 ENCOUNTER — Emergency Department (HOSPITAL_BASED_OUTPATIENT_CLINIC_OR_DEPARTMENT_OTHER): Payer: BLUE CROSS/BLUE SHIELD

## 2023-02-26 DIAGNOSIS — Z7902 Long term (current) use of antithrombotics/antiplatelets: Secondary | ICD-10-CM | POA: Diagnosis not present

## 2023-02-26 DIAGNOSIS — Z794 Long term (current) use of insulin: Secondary | ICD-10-CM | POA: Diagnosis not present

## 2023-02-26 DIAGNOSIS — E860 Dehydration: Secondary | ICD-10-CM | POA: Insufficient documentation

## 2023-02-26 DIAGNOSIS — E119 Type 2 diabetes mellitus without complications: Secondary | ICD-10-CM | POA: Diagnosis not present

## 2023-02-26 DIAGNOSIS — Z79899 Other long term (current) drug therapy: Secondary | ICD-10-CM | POA: Insufficient documentation

## 2023-02-26 DIAGNOSIS — Z7982 Long term (current) use of aspirin: Secondary | ICD-10-CM | POA: Diagnosis not present

## 2023-02-26 DIAGNOSIS — R079 Chest pain, unspecified: Secondary | ICD-10-CM | POA: Diagnosis present

## 2023-02-26 DIAGNOSIS — Z20822 Contact with and (suspected) exposure to covid-19: Secondary | ICD-10-CM | POA: Insufficient documentation

## 2023-02-26 LAB — CBC
HCT: 43.4 % (ref 36.0–46.0)
Hemoglobin: 13.9 g/dL (ref 12.0–15.0)
MCH: 25.7 pg — ABNORMAL LOW (ref 26.0–34.0)
MCHC: 32 g/dL (ref 30.0–36.0)
MCV: 80.4 fL (ref 80.0–100.0)
Platelets: 208 10*3/uL (ref 150–400)
RBC: 5.4 MIL/uL — ABNORMAL HIGH (ref 3.87–5.11)
RDW: 14.3 % (ref 11.5–15.5)
WBC: 7.7 10*3/uL (ref 4.0–10.5)
nRBC: 0 % (ref 0.0–0.2)

## 2023-02-26 LAB — BASIC METABOLIC PANEL
Anion gap: 8 (ref 5–15)
BUN: 12 mg/dL (ref 6–20)
CO2: 26 mmol/L (ref 22–32)
Calcium: 9.3 mg/dL (ref 8.9–10.3)
Chloride: 103 mmol/L (ref 98–111)
Creatinine, Ser: 1 mg/dL (ref 0.44–1.00)
GFR, Estimated: >60 mL/min (ref >60)
Glucose, Bld: 198 mg/dL — ABNORMAL HIGH (ref 70–99)
Potassium: 3.9 mmol/L (ref 3.5–5.1)
Sodium: 137 mmol/L (ref 135–145)

## 2023-02-26 LAB — RESP PANEL BY RT-PCR (RSV, FLU A&B, COVID)  RVPGX2
Influenza A by PCR: NEGATIVE
Influenza B by PCR: NEGATIVE
Resp Syncytial Virus by PCR: NEGATIVE
SARS Coronavirus 2 by RT PCR: NEGATIVE

## 2023-02-26 LAB — HEPATIC FUNCTION PANEL
ALT: 48 U/L — ABNORMAL HIGH (ref 0–44)
AST: 41 U/L (ref 15–41)
Albumin: 4.1 g/dL (ref 3.5–5.0)
Alkaline Phosphatase: 53 U/L (ref 38–126)
Bilirubin, Direct: 0.1 mg/dL (ref 0.0–0.2)
Indirect Bilirubin: 0.2 mg/dL — ABNORMAL LOW (ref 0.3–0.9)
Total Bilirubin: 0.3 mg/dL (ref <1.2)
Total Protein: 6.9 g/dL (ref 6.5–8.1)

## 2023-02-26 LAB — TROPONIN I (HIGH SENSITIVITY)
Troponin I (High Sensitivity): <2 ng/L (ref <18)
Troponin I (High Sensitivity): <2 ng/L (ref <18)

## 2023-02-26 LAB — LIPASE, BLOOD: Lipase: 42 U/L (ref 11–51)

## 2023-02-26 LAB — LACTIC ACID, PLASMA: Lactic Acid, Venous: 1.4 mmol/L (ref 0.5–1.9)

## 2023-02-26 MED ORDER — SODIUM CHLORIDE 0.9 % IV BOLUS
1000.0000 mL | Freq: Once | INTRAVENOUS | Status: AC
  Administered 2023-02-26: 1000 mL via INTRAVENOUS

## 2023-02-26 MED ORDER — SODIUM CHLORIDE 0.9 % IV SOLN
12.5000 mg | Freq: Four times a day (QID) | INTRAVENOUS | Status: DC | PRN
  Administered 2023-02-26: 12.5 mg via INTRAVENOUS
  Filled 2023-02-26: qty 0.5

## 2023-02-26 MED ORDER — ACETAMINOPHEN 325 MG PO TABS
650.0000 mg | ORAL_TABLET | Freq: Once | ORAL | Status: AC
  Administered 2023-02-26: 650 mg via ORAL
  Filled 2023-02-26: qty 2

## 2023-02-26 MED ORDER — MORPHINE SULFATE (PF) 2 MG/ML IV SOLN
2.0000 mg | Freq: Once | INTRAVENOUS | Status: AC
  Administered 2023-02-26: 2 mg via INTRAVENOUS
  Filled 2023-02-26: qty 1

## 2023-02-26 MED ORDER — PROMETHAZINE HCL 25 MG/ML IJ SOLN
INTRAMUSCULAR | Status: AC
  Filled 2023-02-26: qty 1

## 2023-02-26 MED ORDER — ONDANSETRON HCL 4 MG/2ML IJ SOLN
4.0000 mg | Freq: Once | INTRAMUSCULAR | Status: AC
  Administered 2023-02-26: 4 mg via INTRAVENOUS
  Filled 2023-02-26: qty 2

## 2023-02-26 NOTE — Telephone Encounter (Signed)
Patient contacted cardiology service with complaint of chest pain and shortness of breath.  She has been feeling unwell since Christmas.  She previously sought medical attention at local urgent care/ED however left before seen.  Her shortness of breath and chest discomfort has not improved.  I recommended the patient seek urgent medical attention by calling 911 and EMS and have additional workup in the local ED.  Patient prefers to come to [facility] and be transported by private vehicle instead.

## 2023-02-26 NOTE — Discharge Instructions (Signed)
Your workup today was reassuring.  I have placed a consult to our cardiology team.  You should receive a call in the next few days for follow-up.  Please follow-up as scheduled.  Return to the emergency department for new or worsening symptoms.

## 2023-02-26 NOTE — ED Triage Notes (Signed)
She c/o persistent central chest discomfort, radiating into left arm "for several days now--since before Christmas". She also c/o increasing weakness. She tells [place] she is diabetic and that her monitor is currently showing her blood sugar to be in the low 200's. She is drowsy and in no distress.

## 2023-02-26 NOTE — ED Provider Notes (Signed)
[facility]PART[facility] AT [facility] Provider Note   CSN: [id number] Arrival date & time: 02/26/23  1630     History  Chief Complaint  Patient presents with   Chest Pain    Jean Calderon is a 52 y.o. female.  52 year old female with past medical history of diabetes and chronic pancreatitis as well as coronary artery disease presenting to the emergency department today with left-sided chest discomfort.  The patient states that this been going on now for the past 4 days.  She reports that she has been having some fatigue.  She would initially went to urgent care and was told she was dehydrated.  She has been try to drink plenty of fluids at home but is still feeling some fatigue.  States that she started with some chest discomfort earlier this morning.  States that the pain is over her left side of her chest and does not radiate.  She had to has some nausea with a few episodes of nonbloody, nonbilious emesis over the past few days.  She does report some loose stools with this.  Denies any blood in her stool or dark stools.  Denies any blood in her vomit.  She came to the ER today due to these ongoing symptoms after calling her cardiologist who told her to come in.   Chest Pain Associated symptoms: fatigue, nausea and vomiting        Home Medications Prior to Admission medications   Medication Sig Start Date End Date Taking? Authorizing Provider  acetaminophen (TYLENOL) 325 MG tablet Take 2 tablets (650 mg total) by mouth every 6 (six) hours as needed for moderate pain. 04/09/22   Wallis Bamberg, PA-C  albuterol (VENTOLIN HFA) 108 (90 Base) MCG/ACT inhaler Inhale 2 puffs into the lungs every 4 (four) hours as needed for shortness of breath or wheezing. 01/22/22   Lattie Haw, MD  aspirin EC 81 MG tablet Take 81 mg by mouth daily.    [provider]  atorvastatin (LIPITOR) 40 MG tablet Take 1 tablet (40 mg total) by mouth daily. 06/14/22   Suzan Slick, MD   benzonatate (TESSALON) 200 MG capsule Take 1 capsule (200 mg total) by mouth 3 (three) times daily as needed for cough. 01/26/23   Eustace Moore, MD  chlorhexidine (HIBICLENS) 4 % external liquid Apply topically daily as needed. 12/06/22   Suzan Slick, MD  clopidogrel (PLAVIX) 75 MG tablet Take 1 tablet (75 mg total) by mouth daily. 02/14/23   Suzan Slick, MD  Continuous Blood Gluc Receiver (DEXCOM G7 RECEIVER) DEVI Check sugars daily Dx E11.9 04/05/22   Suzan Slick, MD  Continuous Blood Gluc Sensor (DEXCOM G7 SENSOR) MISC Check sugars daily Dx E11.9 04/05/22   Suzan Slick, MD  Continuous Glucose Transmitter (DEXCOM G6 TRANSMITTER) MISC  07/01/22   [provider]  Crisaborole (EUCRISA) 2 % OINT Apply 1 Application topically 2 (two) times daily. 06/11/20   [provider]  Estradiol-Norethindrone Acet 0.5-0.1 MG tablet Take 1 tablet by mouth daily. 12/06/22   Suzan Slick, MD  fexofenadine (ALLEGRA) 180 MG tablet Take 1 tablet (180 mg total) by mouth daily. 05/06/22   Rucker, Magdalen Spatz, MD  glipiZIDE (GLUCOTROL XL) 5 MG 24 hr tablet Take 5 mg by mouth daily with breakfast.    [provider]  HUMALOG 100 UNIT/ML injection INJECT 25 UNITS INTO THE SKIN 3 TIMES DAILY BEFORE MEALS. USE INSULIN PUMP 05/12/22   Rucker,  Magdalen Spatz, MD  hydrOXYzine (ATARAX) 25 MG tablet Take 1 tablet (25 mg total) by mouth every 8 (eight) hours as needed. 02/14/23   Suzan Slick, MD  Insulin Aspart, w/Niacinamide, (FIASP) 100 UNIT/ML SOLN To be used with insulin pump. 07/08/22   [provider]  Insulin Disposable Pump (OMNIPOD 5 G6 PODS, GEN 5,) MISC CHANGE POD EVERY OTHER DAY. 06/07/22   Suzan Slick, MD  isosorbide mononitrate (IMDUR) 30 MG 24 hr tablet TAKE 1 TABLET(30 MG) BY MOUTH DAILY 02/21/23   Rucker, Magdalen Spatz, MD  JARDIANCE 25 MG TABS tablet Take 1 tablet (25 mg total) by mouth daily. 06/15/22   Suzan Slick, MD  ketoconazole (NIZORAL) 2 %  shampoo Apply to affected areas 3 x a week for 8 weeks 11/18/22   Suzan Slick, MD  metoprolol succinate (TOPROL-XL) 100 MG 24 hr tablet Take 1 tablet (100 mg total) by mouth daily. 11/12/22   Lewayne Bunting, MD  mometasone (NASONEX) 50 MCG/ACT nasal spray One spray in each nostril twice a day, use left hand for right nostril, and right hand for left nostril.  Please dispense one bottle. 05/24/22   Rucker, Magdalen Spatz, MD  montelukast (SINGULAIR) 10 MG tablet TAKE 1 TABLET(10 MG) BY MOUTH AT BEDTIME 10/26/22   Suzan Slick, MD  mupirocin ointment (BACTROBAN) 2 % Apply 1 Application topically daily. 12/06/22   Suzan Slick, MD  nitroGLYCERIN (NITROSTAT) 0.4 MG SL tablet Place 1 tablet (0.4 mg total) under the tongue every 5 (five) minutes as needed for chest pain. 11/12/22   Lewayne Bunting, MD  nystatin ointment (MYCOSTATIN) Apply 1 Application topically 2 (two) times daily. 07/22/22   Rucker, Magdalen Spatz, MD  olopatadine (PATADAY) 0.1 % ophthalmic solution Place 1 drop into both eyes 2 (two) times daily. 05/24/22   Suzan Slick, MD  pantoprazole (PROTONIX) 40 MG tablet Take 1 tablet (40 mg total) by mouth daily. 07/22/22   Rucker, Magdalen Spatz, MD  pregabalin (LYRICA) 200 MG capsule TAKE 1 CAPSULE BY MOUTH TWICE DAILY 11/09/22   Rucker, Magdalen Spatz, MD  sucralfate (CARAFATE) 1 g tablet TAKE 1 TABLET(1 GRAM) BY MOUTH FOUR TIMES DAILY AT BEDTIME WITH MEALS 11/24/22   Suzan Slick, MD  terbinafine (LAMISIL) 250 MG tablet Take 1 tablet (250 mg total) by mouth daily. 11/18/22   Suzan Slick, MD  topiramate (TOPAMAX) 100 MG tablet Take by mouth 2 (two) times daily. 08/17/21   [provider]  traZODone (DESYREL) 100 MG tablet TAKE 1 TABLET(100 MG) BY MOUTH AT BEDTIME AS NEEDED FOR SLEEP 02/14/23   Suzan Slick, MD      Allergies    Tramadol, Nsaids, Sulfamethoxazole-trimethoprim, and Ibuprofen    Review of Systems   Review of Systems  Constitutional:  Positive for fatigue.   Cardiovascular:  Positive for chest pain.  Gastrointestinal:  Positive for diarrhea, nausea and vomiting.  All other systems reviewed and are negative.   Physical Exam Updated Vital Signs BP (!) 113/49   Pulse 73   Temp 97.6 F (36.4 C)   Resp 17   SpO2 99%  Physical Exam Vitals and nursing note reviewed.   Gen: NAD Eyes: PERRL, EOMI HEENT: no oropharyngeal swelling Neck: trachea midline Resp: clear to auscultation bilaterally Card: RRR, no murmurs, rubs, or gallops Abd: nontender, nondistended Extremities: no calf tenderness, no edema Vascular: 2+ radial pulses bilaterally, 2+ DP pulses bilaterally Skin: no rashes Psyc: acting appropriately  ED Results / Procedures / Treatments   Labs (all labs ordered are listed, but only abnormal results are displayed) Labs Reviewed  BASIC METABOLIC PANEL - Abnormal; Notable for the following components:      Result Value   Glucose, Bld 198 (*)    All other components within normal limits  CBC - Abnormal; Notable for the following components:   RBC 5.40 (*)    MCH 25.7 (*)    All other components within normal limits  HEPATIC FUNCTION PANEL - Abnormal; Notable for the following components:   ALT 48 (*)    Indirect Bilirubin 0.2 (*)    All other components within normal limits  RESP PANEL BY RT-PCR (RSV, FLU A&B, COVID)  RVPGX2  LIPASE, BLOOD  LACTIC ACID, PLASMA  LACTIC ACID, PLASMA  TROPONIN I (HIGH SENSITIVITY)  TROPONIN I (HIGH SENSITIVITY)    EKG None  Radiology DG Chest Port 1 View Result Date: 02/26/2023 CLINICAL DATA:  Persistent central chest discomfort radiating into the left arm associated with increasing weakness EXAM: PORTABLE CHEST 1 VIEW COMPARISON:  Chest radiograph dated 07/20/2022 FINDINGS: Low lung volumes with bronchovascular crowding. No focal consolidations. No pleural effusion or pneumothorax. The heart size and mediastinal contours are within normal limits. No acute osseous abnormality.  IMPRESSION: Low lung volumes with bronchovascular crowding. No focal consolidations. Electronically Signed   By: Agustin Cree M.D.   On: 02/26/2023 18:24    Procedures Procedures    Medications Ordered in ED Medications  promethazine (PHENERGAN) 12.5 mg in sodium chloride 0.9 % 50 mL IVPB (0 mg Intravenous Stopped 02/26/23 1905)  promethazine (PHENERGAN) 25 MG/ML injection (  Not Given 02/26/23 1921)  sodium chloride 0.9 % bolus 1,000 mL ( Intravenous Stopped 02/26/23 1908)  acetaminophen (TYLENOL) tablet 650 mg (650 mg Oral Given 02/26/23 1809)  morphine (PF) 2 MG/ML injection 2 mg (2 mg Intravenous Given 02/26/23 2040)  ondansetron (ZOFRAN) injection 4 mg (4 mg Intravenous Given 02/26/23 2039)    ED Course/ Medical Decision Making/ A&P                                 Medical Decision Making 52 year old female with past medical history of coronary artery disease, diabetes, and hypertension presents the emergency department today with chest pain.  This does seem relatively atypical and somewhat worse with movements.  I will further evaluate patient here with basic labs Wels and EKG, chest x-ray, and troponin for further evaluation for ACS, pulmonary edema, pulmonary infiltrates, or pneumothorax.  Based on description of her symptoms and duration suspicion for aortic dissection or pulmonary embolism is low at this time.  Acute patient on the monitor here.  Also obtain a lipase about for pancreatitis.  I will reevaluate for ultimate disposition.  The patient's EKG interpreted by me shows no acute changes from her baseline EKG.  She had 2 troponins here that were negative.  Her pain did improve with medications here.  Chest x-ray is unremarkable.  Think that she is stable for discharge.  Given her risk factors we will refer her to cardiology for outpatient workup.  Amount and/or Complexity of Data Reviewed Labs: ordered. Radiology: ordered.  Risk OTC drugs. Prescription drug  management.           Final Clinical Impression(s) / ED Diagnoses Final diagnoses:  Chest pain, unspecified type    Rx / DC Orders ED Discharge Orders  Ordered    Ambulatory referral to Cardiology       Comments: If you have not heard from the Cardiology office within the next 72 hours please call [phone].   02/26/23 2307              Durwin Glaze, MD 02/26/23 2309

## 2023-03-03 ENCOUNTER — Ambulatory Visit: Payer: Medicaid Other | Admitting: Internal Medicine

## 2023-03-08 ENCOUNTER — Ambulatory Visit: Payer: Medicaid Other | Admitting: Gastroenterology

## 2023-03-08 NOTE — Progress Notes (Deleted)
 HPI :    She was seen in the emergency department at [facility] December 28 with symptoms of chest pain, fatigue and nausea/vomiting.  Labs were unremarkable.  EKG was unremarkable.  She was referred to cardiology for outpatient evaluation.  She was seen in the emergency department December 26 in [place] with reports of chest pain, nausea, vomiting and diarrhea, but she left before being seen by a provider.  Emergency department in [facility] November 11 with complaints of left upper quadrant pain.  She did not have symptoms of nausea or vomiting at that time.  A CT scan showed a dilated common bile duct.  Liver enzymes were slightly elevated and a hepatocellular pattern, consistent with previous checks.  There was no concern for biliary obstruction.  CT abdomen/pelvis January 10, 2023 Impression:  IMPRESSION: There is hepatic steatosis and hepatosplenomegaly.  The gallbladder is present. The common duct is dilated which could be due to choledocholithiasis.   CT Abd/pelvis May 2024 IMPRESSION: 1. No acute localizing process in the abdomen or pelvis. 2. Hepatosplenomegaly. 3. Fatty infiltration of the liver. 4. Colonic diverticulosis.  Past Medical History:  Diagnosis Date   CAD (coronary artery disease)    Cervical radiculopathy 08/15/2017   CKD (chronic kidney disease), stage II    COVID-19 01/2022   DM (diabetes mellitus) (HCC)    HTN (hypertension)    Hyperlipidemia    Lumbar radiculopathy 06/21/2019   Last Assessment & Plan: Formatting of this note might be different from the original. Scheduled for laminectomy L5 4/27 Pre-op complete at hospital; labs and ekg reviewed. Scheduled for cardiology eval in one day Medical cleared for procedure by IM evaluation.   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 Severe tendinosis of the supraspinatus tendon.  2. Mild tendinosis of the infraspinatus  tendon.  3. Thickening of the inferior joint capsule and intermediate signal  material effacing the normal subcoracoid fat as can be seen with  adhesive capsulitis.    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     Past Surgical History:  Procedure Laterality Date   APPENDECTOMY     BACK SURGERY     CESAREAN SECTION     CORONARY ANGIOPLASTY WITH STENT PLACEMENT     MULTIPLE TOOTH EXTRACTIONS     ROTATOR CUFF REPAIR     Family History  Problem Relation Age of Onset   Heart disease Mother    Heart disease Sister    Social History   Tobacco Use   Smoking status: Former    Types: Cigarettes   Smokeless tobacco: Never  Vaping Use   Vaping status: Never Used  Substance Use Topics   Alcohol use: Never   Drug use: Never   Current Outpatient Medications  Medication Sig Dispense Refill   acetaminophen  (TYLENOL ) 325 MG tablet Take 2 tablets (650 mg total) by mouth every 6 (six) hours as needed for moderate pain. 30 tablet 0   albuterol  (VENTOLIN  HFA) 108 (90 Base) MCG/ACT inhaler Inhale 2 puffs into the lungs every 4 (four) hours as needed for shortness of breath or wheezing. 1 each 1   aspirin  EC 81 MG tablet Take 81 mg by mouth daily.     atorvastatin  (LIPITOR) 40 MG tablet Take 1 tablet (40 mg total) by mouth daily. 90 tablet 1   benzonatate  (TESSALON ) 200 MG capsule  Take 1 capsule (200 mg total) by mouth 3 (three) times daily as needed for cough. 21 capsule 0   chlorhexidine  (HIBICLENS ) 4 % external liquid Apply topically daily as needed. 946 mL 1   clopidogrel  (PLAVIX ) 75 MG tablet Take 1 tablet (75 mg total) by mouth daily. 90 tablet 1   Continuous Blood Gluc Receiver (DEXCOM G7 RECEIVER) DEVI Check sugars daily Dx E11.9 1 each 2   Continuous Blood Gluc Sensor (DEXCOM G7 SENSOR) MISC Check sugars daily Dx E11.9 1 each 5   Continuous Glucose Transmitter (DEXCOM G6 TRANSMITTER) MISC       Crisaborole (EUCRISA) 2 % OINT Apply 1 Application topically 2 (two) times daily.     Estradiol -Norethindrone  Acet 0.5-0.1 MG tablet Take 1 tablet by mouth daily. 84 tablet 0   fexofenadine  (ALLEGRA ) 180 MG tablet Take 1 tablet (180 mg total) by mouth daily. 90 tablet 3   glipiZIDE (GLUCOTROL XL) 5 MG 24 hr tablet Take 5 mg by mouth daily with breakfast.     HUMALOG  100 UNIT/ML injection INJECT 25 UNITS INTO THE SKIN 3 TIMES DAILY BEFORE MEALS. USE INSULIN  PUMP 10 mL 1   hydrOXYzine  (ATARAX ) 25 MG tablet Take 1 tablet (25 mg total) by mouth every 8 (eight) hours as needed. 45 tablet 1   Insulin  Aspart, w/Niacinamide, (FIASP ) 100 UNIT/ML SOLN To be used with insulin  pump.     Insulin  Disposable Pump (OMNIPOD 5 G6 PODS, GEN 5,) MISC CHANGE POD EVERY OTHER DAY. 30 each 0   isosorbide  mononitrate (IMDUR ) 30 MG 24 hr tablet TAKE 1 TABLET(30 MG) BY MOUTH DAILY 90 tablet 1   JARDIANCE  25 MG TABS tablet Take 1 tablet (25 mg total) by mouth daily. 30 tablet 0   ketoconazole  (NIZORAL ) 2 % shampoo Apply to affected areas 3 x a week for 8 weeks 120 mL 1   metoprolol  succinate (TOPROL -XL) 100 MG 24 hr tablet Take 1 tablet (100 mg total) by mouth daily. 90 tablet 3   mometasone  (NASONEX ) 50 MCG/ACT nasal spray One spray in each nostril twice a day, use left hand for right nostril, and right hand for left nostril.  Please dispense one bottle. 1 g 6   montelukast  (SINGULAIR ) 10 MG tablet TAKE 1 TABLET(10 MG) BY MOUTH AT BEDTIME 30 tablet 3   mupirocin  ointment (BACTROBAN ) 2 % Apply 1 Application topically daily. 22 g 3   nitroGLYCERIN  (NITROSTAT ) 0.4 MG SL tablet Place 1 tablet (0.4 mg total) under the tongue every 5 (five) minutes as needed for chest pain. 25 tablet 6   nystatin  ointment (MYCOSTATIN ) Apply 1 Application topically 2 (two) times daily. 30 g 2   olopatadine  (PATADAY ) 0.1 % ophthalmic solution Place 1 drop into both eyes 2 (two) times daily. 5 mL 3   pantoprazole  (PROTONIX ) 40 MG tablet Take 1 tablet  (40 mg total) by mouth daily. 30 tablet 3   pregabalin  (LYRICA ) 200 MG capsule TAKE 1 CAPSULE BY MOUTH TWICE DAILY 60 capsule 5   sucralfate  (CARAFATE ) 1 g tablet TAKE 1 TABLET(1 GRAM) BY MOUTH FOUR TIMES DAILY AT BEDTIME WITH MEALS 120 tablet 0   terbinafine  (LAMISIL ) 250 MG tablet Take 1 tablet (250 mg total) by mouth daily. 14 tablet 0   topiramate  (TOPAMAX ) 100 MG tablet Take by mouth 2 (two) times daily.     traZODone  (DESYREL ) 100 MG tablet TAKE 1 TABLET(100 MG) BY MOUTH AT BEDTIME AS NEEDED FOR SLEEP 90 tablet 0   No current facility-administered medications for  this visit.   Allergies  Allergen Reactions   Tramadol     Other reaction(s): Mental Status Changes (intolerance) Caused Hallucinations   Nsaids Other (See Comments)    Cannot take because she's on Plavix .   Other reaction(s): Bleeding (intolerance) Cannot take because she's on Plavix .    Sulfamethoxazole-Trimethoprim Nausea And Vomiting    History of Colitis   Ibuprofen     Not an allergy. Pt stated she can't take it because of current use of ASA and Plavix      Review of Systems: All systems reviewed and negative except where noted in HPI.    DG Chest Port 1 View Result Date: 02/26/2023 CLINICAL DATA:  Persistent central chest discomfort radiating into the left arm associated with increasing weakness EXAM: PORTABLE CHEST 1 VIEW COMPARISON:  Chest radiograph dated 07/20/2022 FINDINGS: Low lung volumes with bronchovascular crowding. No focal consolidations. No pleural effusion or pneumothorax. The heart size and mediastinal contours are within normal limits. No acute osseous abnormality. IMPRESSION: Low lung volumes with bronchovascular crowding. No focal consolidations. Electronically Signed   By: Limin  Xu M.D.   On: 02/26/2023 18:24    Physical Exam: There were no vitals taken for this visit. Constitutional: Pleasant,well-developed, ***female in no acute distress. HEENT: Normocephalic and atraumatic.  Conjunctivae are normal. No scleral icterus. Neck supple.  Cardiovascular: Normal rate, regular rhythm.  Pulmonary/chest: Effort normal and breath sounds normal. No wheezing, rales or rhonchi. Abdominal: Soft, nondistended, nontender. Bowel sounds active throughout. There are no masses palpable. No hepatomegaly. Extremities: no edema Lymphadenopathy: No cervical adenopathy noted. Neurological: Alert and oriented to person place and time. Skin: Skin is warm and dry. No rashes noted. Psychiatric: Normal mood and affect. Behavior is normal.  CBC    Component Value Date/Time   WBC 7.7 02/26/2023 1648   RBC 5.40 (H) 02/26/2023 1648   HGB 13.9 02/26/2023 1648   HCT 43.4 02/26/2023 1648   PLT 208 02/26/2023 1648   MCV 80.4 02/26/2023 1648   MCH 25.7 (L) 02/26/2023 1648   MCHC 32.0 02/26/2023 1648   RDW 14.3 02/26/2023 1648   LYMPHSABS 2.2 04/12/2021 1326   MONOABS 0.4 04/12/2021 1326   EOSABS 0.1 04/12/2021 1326   BASOSABS 0.0 04/12/2021 1326    CMP     Component Value Date/Time   NA 137 02/26/2023 1648   NA 137 02/04/2022 1213   K 3.9 02/26/2023 1648   CL 103 02/26/2023 1648   CO2 26 02/26/2023 1648   GLUCOSE 198 (H) 02/26/2023 1648   BUN 12 02/26/2023 1648   BUN 9 02/04/2022 1213   CREATININE 1.00 02/26/2023 1648   CALCIUM  9.3 02/26/2023 1648   PROT 6.9 02/26/2023 1648   PROT 7.2 02/04/2022 1213   ALBUMIN 4.1 02/26/2023 1648   ALBUMIN 4.3 02/04/2022 1213   AST 41 02/26/2023 1648   ALT 48 (H) 02/26/2023 1648   ALKPHOS 53 02/26/2023 1648   BILITOT 0.3 02/26/2023 1648   BILITOT 0.4 02/04/2022 1213   GFRNONAA >60 02/26/2023 1648       Latest Ref Rng & Units 02/26/2023    4:48 PM 07/20/2022    4:40 PM 04/12/2021    1:26 PM  CBC EXTENDED  WBC 4.0 - 10.5 K/uL 7.7  9.2  7.7   RBC 3.87 - 5.11 MIL/uL 5.40  5.48  5.24   Hemoglobin 12.0 - 15.0 g/dL 86.0  85.8  85.4   HCT 36.0 - 46.0 % 43.4  45.9  43.5   Platelets 150 -  400 K/uL 208  194  233   NEUT# 1.7 - 7.7 K/uL   4.9    Lymph# 0.7 - 4.0 K/uL   2.2       ASSESSMENT AND PLAN:  Jean Calderon GRADE, MD

## 2023-03-09 ENCOUNTER — Encounter
Payer: Medicaid Other | Attending: Physical Medicine and Rehabilitation | Admitting: Physical Medicine and Rehabilitation

## 2023-03-09 ENCOUNTER — Ambulatory Visit (HOSPITAL_COMMUNITY): Admission: RE | Admit: 2023-03-09 | Payer: BLUE CROSS/BLUE SHIELD | Source: Ambulatory Visit

## 2023-03-09 DIAGNOSIS — G8929 Other chronic pain: Secondary | ICD-10-CM | POA: Insufficient documentation

## 2023-03-09 DIAGNOSIS — M545 Low back pain, unspecified: Secondary | ICD-10-CM | POA: Insufficient documentation

## 2023-03-09 DIAGNOSIS — G894 Chronic pain syndrome: Secondary | ICD-10-CM | POA: Insufficient documentation

## 2023-03-09 DIAGNOSIS — E1142 Type 2 diabetes mellitus with diabetic polyneuropathy: Secondary | ICD-10-CM | POA: Insufficient documentation

## 2023-03-11 ENCOUNTER — Ambulatory Visit: Payer: BLUE CROSS/BLUE SHIELD | Attending: Adult Health | Admitting: Adult Health

## 2023-03-11 NOTE — Progress Notes (Deleted)
  Cardiology Office Note:  .   Date:  03/11/2023  ID:  Jean Calderon, DOB February 28, 1971, MRN [id number] PCP: Colette Torrence GRADE, MD  [facility] Providers Cardiologist:  Durand Shallow   }   History of Present Illness: .   Jean Calderon is a 53 y.o. female with history of coronary artery disease cardiac catheterization March 2022 showed EF of 65% normal LV end-diastolic pressure, 50% proximal, 30% mid, 40% distal LAD with 30% first diagonal, 30% ramus, 30% proximal, mid distal complex, 30% OM, 30% RCA.  Last seen by Dr. Shallow on 11/12/2022.  Other history includes chronic kidney disease stage II, hypertension, hyperlipidemia, chronic dyspnea, and diabetes.  ROS: ***  Studies Reviewed: .        *** EKG Interpretation Date/Time:    Ventricular Rate:    PR Interval:    QRS Duration:    QT Interval:    QTC Calculation:   R Axis:      Text Interpretation:      Physical Exam:   VS:  There were no vitals taken for this visit.   Wt Readings from Last 3 Encounters:  01/26/23 275 lb (124.7 kg)  11/24/22 283 lb 14.4 oz (128.8 kg)  11/18/22 279 lb (126.6 kg)    GEN: Well nourished, well developed in no acute distress NECK: No JVD; No carotid bruits CARDIAC: ***RRR, no murmurs, rubs, gallops RESPIRATORY:  Clear to auscultation without rales, wheezing or rhonchi  ABDOMEN: Soft, non-tender, non-distended EXTREMITIES:  No edema; No deformity   ASSESSMENT AND PLAN: .   ***    {Are you ordering a CV Procedure (e.g. stress test, cath, DCCV, TEE, etc)?   Press F2        :[id number]}    Signed, Lamarr HERO. Jerilynn CHOL, ANP, AACC

## 2023-03-16 NOTE — Progress Notes (Signed)
 Subjective   Patient ID: Jean Calderon is a 53 y.o. (DOB [birth year]) female  History of Present Illness: This is a 53 y.o. female who returns for an overdue follow-up of type 2 diabetes, on Omnipod 5. She is not on Dexcom G7 at this time.   As noted in the initial consult, patient reports she was diagnosed with diabetes at age 29. During this visit, she had told me she was always told she had type 1 diabetes. Labs were collected to confirm. She was also on Omnipod 5 with Dexcom G7 which is not integrated with the Omnipod 5 system. Therefore, she was in manual mode with glucoses averaging in the 300-400s.            Lab Results  Component Value Date/Time    C-Peptide, Serum 6.0 (H) 06/23/2022 04:05 PM    GAD-65 <5.0 06/23/2022 04:05 PM    ZNT8 ANTIBODIES <15 06/23/2022 04:05 PM    Insulin  Antibodies <5.0 06/23/2022 04:05 PM    Glucose 373 (H) 06/23/2022 04:05 PM    Medical history significant for HTN, HLD, OSA, CAD, and hx of DKA >4 years ago. Was previously seen by [facility] last on 05/21/21. At this time she was on Omnipod 5.   Of note, patient was recently admitted for transaminitis with mild acute pancreatitis.   A1c 10.9% today. Last A1c 11.5% on 09/17/22, 13.3% on 06/06/22, 10.9% on 09/30/21, 10.3% on 03/24/21, 11.2% on 11/25/20, 11.9% on 05/09/20, 6.8% on 03/29/17, 7.6% on 02/11/17.   Patient confirms taking diabetes medications as follows:  Novolog  via insulin  pump  Jardiance  25mg  #1 tablet daily  Glipizide ER 5mg  #1 tablet daily        Weight trend:  Body mass index is 41.2 kg/m.  Wt Readings from Last 3 Encounters:  03/16/23 279 lb (126.6 kg)  02/24/23 284 lb (128.8 kg)  01/14/23 284 lb (128.8 kg)    Retinopathy: Historical documentation of retinopathy  Neuropathy: Peripheral (on pregabalin )  Nephropathy: CKD stage 2    Cardiovascular disease: CAD  Cerebrovascular disease: No historical documentation  Peripheral vascular disease: No  historical documentation    Most recent retinal exam:                                      Unknown  Most recent LDL cholesterol:                                 Unable to calculate due to TG >400              Taking statin?:                                          Atorvastatin  40mg   Most recent urine microalbumin/Cr ratio:              Unknown               Taking ACEI/ARB?:                                  Lisinopril -HCTZ 20-12.5mg  Taking daily ASA?:  Plavix  75mg   Current smoker?:                                                   No  Discussed smoking cessation?:                            N/A   Patient Active Problem List  Diagnosis  . Transaminitis  . Hyperglycemia due to diabetes mellitus (*)  . AKI (acute kidney injury) (*)  . CKD stage G3b/A3, GFR 30-44 and albumin creatinine ratio >300 mg/g (*)  . Acute pancreatitis  . Uncontrolled type 1 diabetes mellitus with hyperglycemia, with long-term current use of insulin  (*)  . Insulin  pump in place  . Uses self-applied continuous glucose monitoring device  . Uncontrolled type 2 diabetes mellitus with hyperglycemia, with long-term current use of insulin  (*)    Past Medical History:  Diagnosis Date  . CAD (coronary artery disease)   . Diabetes mellitus (*)     Past Surgical History:  Procedure Laterality Date  . Back surgery    . Coronary angioplasty with stent placement      Current Outpatient Medications  Medication Instructions  . ACCU-CHEK SOFTCLIX LANCETS lancets Use to monitor blood glucose 4 time(s) daily.  . atorvastatin  (LIPITOR) 40 mg, Oral, Daily  . Blood Glucose Monitoring Suppl (ACCU-CHEK GUIDE) w/Device KIT Use as directed by provider to monitor blood sugars up to 4 times daily  . cloNIDine  (CATAPRES ) 0.1 mg tablet 1 tablet, Oral, 2 times a day  . clopidogrel  bisulfate (PLAVIX ) 75 mg, Oral, Daily  . Continuous Glucose Sensor (DEXCOM G7 SENSOR) MISC 1 each, Transdermal,  Every 10 days  . DULoxetine  HCl (CYMBALTA ) 30 mg capsule Start with 1 tablet once daily for 1 week.  After this, message your doctor through MyChart to let her know about any potential side effects of the medication; if things are going well, we will increase to 1 tab 2 times daily.  . empagliflozin  (JARDIANCE ) 25 mg, Oral, Daily  . Estradiol -Norethindrone  Acet 0.5-0.1 MG per tablet 1 tablet, Oral, Daily  . fexofenadine  (ALLEGRA ) 180 mg, Oral, Daily  . fluticasone  propionate (FLONASE ) 50 mcg/actuation nasal spray 2 sprays, Daily  . glipiZIDE ER (GLUCOTROL XL) 10 mg, Oral, Daily  . glucose blood (ACCU-CHEK GUIDE) test strip Use to monitor blood glucose 4 time(s) daily  . hydrOXYzine  HCl (ATARAX ) 25 mg, Oral, 3 times daily  . Insulin  Disposable Pump (OMNIPOD 5 DEXG7G6 PODS GEN 5) MISC 1 system, Transdermal, Daily  . isosorbide  mononitrate (IMDUR ) 30 mg, Oral, Daily  . lisinopril -hydrochlorothiazide  (PRINZIDE ,ZESTORETIC ) 20-12.5 MG per tablet 1 tablet, Oral, Daily  . metoprolol  succinate (TOPROL -XL) 100 mg, Oral, Daily  . montelukast  (SINGULAIR ) 10 MG tablet 1 tablet, Oral, At bedtime  . NOVOLOG  100 UNIT/ML injection TO BE USED WITH INSULIN  PUMP. MAX DAILY DOSE UP TO 150 UNITS.  . nystatin  (MYCOSTATIN ) powder Topical, 3 times a day  . pregabalin  (LYRICA ) 200 mg, Oral, 3 times a day  . sertraline  (ZOLOFT ) 100 mg, Oral, Daily  . traZODone  (DESYREL ) 100 mg, Oral, At bedtime  . traZODone  (DESYREL ) 50 mg, Oral, At bedtime  . venlafaxine  HCl (EFFEXOR -XR) 37.5 mg, Oral, Daily     Reviewed and updated this visit by provider: Tobacco  Allergies  Meds  Problems  Med  Hx  Surg Hx  Fam Hx      ROS Eight systems were reviewed, and pertinent positives and negatives are as discussed in HPI.  Objective   Vitals:   03/16/23 1455  Height: 5' 9 (1.753 m)  Weight: 279 lb (126.6 kg)  BMI (Calculated): 41.2   Physical Exam Vitals reviewed.  Cardiovascular:     Rate and Rhythm: Normal rate and  regular rhythm.  Pulmonary:     Effort: Pulmonary effort is normal.     Breath sounds: Normal breath sounds.  Skin:    General: Skin is warm and dry.  Neurological:     General: No focal deficit present.     Mental Status: She is alert and oriented to person, place, and time. Mental status is at baseline.  Psychiatric:        Mood and Affect: Mood normal.        Behavior: Behavior normal.        Thought Content: Thought content normal.        Judgment: Judgment normal.         Laboratory data Lab Results  Component Value Date   Hemoglobin A1c 11.5 (A) 09/17/2022   Hemoglobin A1c 13.3 (H) 06/06/2022   No results found for this or any previous visit. Lab Results  Component Value Date/Time   Glucose 214 (H) 02/24/2023 12:41 PM   BUN 15 02/24/2023 12:41 PM   Creatinine 0.93 02/24/2023 12:41 PM   eGFR 73 02/24/2023 12:41 PM   Na 137 02/24/2023 12:41 PM   Potassium 4.5 02/24/2023 12:41 PM   Cl 101 02/24/2023 12:41 PM   CO2 23 02/24/2023 12:41 PM   Ca 10.2 02/24/2023 12:41 PM   Total Protein 8.0 02/24/2023 12:41 PM   Alb 4.4 02/24/2023 12:41 PM   GLOBULIN 3.6 02/24/2023 12:41 PM   ALBUMIN/GLOBULIN RATIO 1.2 02/24/2023 12:41 PM   T Bili 0.4 02/24/2023 12:41 PM   ALK PHOS 63 02/24/2023 12:41 PM   AST 54 (H) 02/24/2023 12:41 PM   ALT 47 (H) 02/24/2023 12:41 PM    Lab Results  Component Value Date/Time   CHOLESTEROL TOTAL 150 06/10/2022 04:27 PM   Trig 448 (H) 06/10/2022 04:27 PM   HDL 32 (L) 06/10/2022 04:27 PM   VLDL  06/10/2022 04:27 PM     Comment:     It is not recommended to calculate VLDL Cholesterol of samples with Triglyceride values exceeding 400 mg/dl.   LDL  06/10/2022 04:27 PM     Comment:     It is not recommended to calculate LDL Cholesterol of samples with Triglyceride values exceeding 400 mg/dl.    Assessment and Plan   1. Uncontrolled type 2 diabetes mellitus with hyperglycemia, with long-term current use of insulin  (*) (Primary) -     NOVOLOG  100  UNIT/ML injection; TO BE USED WITH INSULIN  PUMP. MAX DAILY DOSE UP TO 150 UNITS., Normal -     POCT Hemoglobin A1C -     glipiZIDE ER (GLUCOTROL XL) 10 mg 24 hr tablet; Take one tablet (10 mg dose) by mouth daily., Starting Wed 03/16/2023, Normal -     Continuous Glucose Sensor (DEXCOM G7 SENSOR) MISC; Place 1 each onto the skin every 10 days., Starting Wed 03/16/2023, Normal -     Insulin  Disposable Pump (OMNIPOD 5 DEXG7G6 PODS GEN 5) MISC; Place one system onto the skin daily., Starting Wed 03/16/2023, Normal  Discussion & Recommendations  Patient has not been seen since 11/23/22. Since then,  patient has cancelled two appointments. Patient has also been disrespectful to support and clinical staff via phone call the past couple of days.   She is aware that our clinic is no longer in network but is okay with being seen today. Patient is insulin  resistant requiring >100 units daily. At our previous OV we discussed switching from Omnipod 5 to Tandem for a larger insulin  reservoir. She has received the Tandem insulin  pump but has not been trained for this. She presents today with her Omnipod 5 in manual mode with no BGM or CGM. [place] report shows that in the past 14 days, she is spending 100% time above >250mg /dL. She is getting 85% (22.9u) of basal and 16% (4.3u) of bolus insulin  and a TDD of 27.2u. Of note, this report is for 2d 17h worth of pod data.    I have provided her a sample G7 sensor and placed it on her R upper extremity. Dexcom is paired to her Omnipod 5 PDM and she is in warm up mode. I have also adjusted her manual basal profile rate to 5u/hr. Max basal rate also adjusted to 9u/hr. I have emphasized the importance of glucose monitoring and utilizing her bolus calculator. Once her Dexcom is warmed up, she has been instructed to switch back to automated mode. If her sensor reading is high at that time, she will use her sensor reading and bolus calculator to give a high correction. I have  personally walked her through each step. Novolog  vials and a BGM with supplies have been sent to her pharmacy but patient reports she cannot afford this. I have provided her with an emergency supply fill of Novolog  (3x 10mL vials) to get filled at this time. Patient is aware that our office can get her established with an in-network endocrinology office.   Patient Instructions A1c 10.9%  Switch from manual to automated mode once your Dexcom G7 sensor is warmed up  At that time, use your sensor glucose reading to give yourself a correction bolus if your glucose level is high  Utilize the bolus calculator and give carb coverage and high correction boluses throughout the day  Continue Jardiance  25mg  #1 tablet daily  INCREASE Glipizide ER 10mg  #1 tablet daily  Continue to use your Dexcom to monitor glucose levels  If you do not see improvements in your glucose levels, please reach out to the office or MyChart message me       Follow up in about 3 months (around 06/14/2023) for T2D (Op5&Dexcom); 40 min.   Documentation for time-based billing:  Total time spent of date of service was 40 minutes.  Patient care activities included preparing to see the patient such as reviewing the patient record, obtaining and/or reviewing separately obtained history, performing a medically appropriate history and physical examination, counseling and educating the patient, family, and/or caregiver, ordering prescription medications, tests, or procedures, referring and communicating with other health care providers when not separately reported during the visit, documenting clinical information in the electronic or other health record, independently interpreting results when not separately reported, communicating results to the patient/family/caregiver, and coordinating the care of the patient when not separately reported.      Delon CINDERELLA Nigh, NP 03/16/2023, 3:42 PM

## 2023-03-16 NOTE — Addendum Note (Signed)
 Addended by: MULLEN, JENNIFER YOOJIN on: 03/16/2023 04:39 PM  Modules accepted: Orders

## 2023-03-17 ENCOUNTER — Ambulatory Visit: Payer: BLUE CROSS/BLUE SHIELD | Admitting: Gastroenterology

## 2023-03-23 NOTE — Progress Notes (Unsigned)
Chief Complaint: Midsternal chest pain, acid indigestion Primary GI Doctor: Dr. Tomasa Rand  HPI:  Patient is a 53year old female patient with past medical history of *****who was referred to me by Suzan Slick, MD on 07/22/2022 for a complaint of midsternal chest pain and acid indigestion.    The patient was seen by Dr.Kheraj in [place] in 2021.  The note indicates diagnosis of gastroparesis, but there is no reference to an abnormal gastric emptying study.  She also has symptoms of GERD.  She underwent an upper endoscopy which showed diffuse gastritis, and subtle findings of gastroesophageal reflux.  She had been treated with Reglan, but was recommended to stop after 3 months. She has multiple other comorbidities to include diabetes and coronary artery disease on Plavix.   Interval History  Patient admits/denies GERD Patient taking pantoprazole 40 mg p.o. daily Patient admits/denies dysphagia Patient admits/denies nausea, vomiting, or weight loss  Patient admits/denies altered bowel habits Patient admits/denies abdominal pain Patient admits/denies rectal bleeding   Denies/Admits alcohol Denies/Admits smoking Denies/Admits NSAID use. Denies/Admits they are on blood thinners. Patient on baby aspirin 81 mg and Plavix 75 mg  Patients last colonoscopy Patients last EGD  Patient has previous history with [facility] in [place], [place],   Patient's family history includes  Wt Readings from Last 3 Encounters:  01/26/23 275 lb (124.7 kg)  11/24/22 283 lb 14.4 oz (128.8 kg)  11/18/22 279 lb (126.6 kg)      Past Medical History:  Diagnosis Date   CAD (coronary artery disease)    Cervical radiculopathy 08/15/2017   CKD (chronic kidney disease), stage II    COVID-19 01/2022   DM (diabetes mellitus) (HCC)    HTN (hypertension)    Hyperlipidemia    Lumbar radiculopathy 06/21/2019   Last Assessment & Plan: Formatting of this note might be  different from the original. Scheduled for laminectomy L5 4/27 Pre-op complete at hospital; labs and ekg reviewed. Scheduled for cardiology eval in one day Medical cleared for procedure by IM evaluation.   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 Severe tendinosis of the supraspinatus tendon.  2. Mild tendinosis of the infraspinatus tendon.  3. Thickening of the inferior joint capsule and intermediate signal  material effacing the normal subcoracoid fat as can be seen with  adhesive capsulitis.    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 30 tablet 0   albuterol (VENTOLIN HFA) 108 (90 Base) MCG/ACT inhaler Inhale 2 puffs into the lungs every 4 (four) hours as needed for shortness of breath or wheezing. 1 each 1   aspirin EC 81 MG tablet Take 81 mg by mouth daily.     atorvastatin (LIPITOR) 40 MG tablet Take 1 tablet (40 mg total) by mouth daily. 90 tablet 1   benzonatate (TESSALON) 200 MG capsule Take 1 capsule (200  mg total) by mouth 3 (three) times daily as needed for cough. 21 capsule 0   chlorhexidine (HIBICLENS) 4 % external liquid Apply topically daily as needed. 946 mL 1   clopidogrel (PLAVIX) 75 MG tablet Take 1 tablet (75 mg total) by mouth daily. 90 tablet 1   Continuous Blood Gluc Receiver (DEXCOM G7 RECEIVER) DEVI Check sugars daily Dx E11.9 1 each 2   Continuous Blood Gluc Sensor (DEXCOM G7 SENSOR) MISC Check  sugars daily Dx E11.9 1 each 5   Continuous Glucose Transmitter (DEXCOM G6 TRANSMITTER) MISC      Crisaborole (EUCRISA) 2 % OINT Apply 1 Application topically 2 (two) times daily.     Estradiol-Norethindrone Acet 0.5-0.1 MG tablet Take 1 tablet by mouth daily. 84 tablet 0   fexofenadine (ALLEGRA) 180 MG tablet Take 1 tablet (180 mg total) by mouth daily. 90 tablet 3   glipiZIDE (GLUCOTROL XL) 5 MG 24 hr tablet Take 5 mg by mouth daily with breakfast.     HUMALOG 100 UNIT/ML injection INJECT 25 UNITS INTO THE SKIN 3 TIMES DAILY BEFORE MEALS. USE INSULIN PUMP 10 mL 1   hydrOXYzine (ATARAX) 25 MG tablet Take 1 tablet (25 mg total) by mouth every 8 (eight) hours as needed. 45 tablet 1   Insulin Aspart, w/Niacinamide, (FIASP) 100 UNIT/ML SOLN To be used with insulin pump.     Insulin Disposable Pump (OMNIPOD 5 G6 PODS, GEN 5,) MISC CHANGE POD EVERY OTHER DAY. 30 each 0   isosorbide mononitrate (IMDUR) 30 MG 24 hr tablet TAKE 1 TABLET(30 MG) BY MOUTH DAILY 90 tablet 1   JARDIANCE 25 MG TABS tablet Take 1 tablet (25 mg total) by mouth daily. 30 tablet 0   ketoconazole (NIZORAL) 2 % shampoo Apply to affected areas 3 x a week for 8 weeks 120 mL 1   metoprolol succinate (TOPROL-XL) 100 MG 24 hr tablet Take 1 tablet (100 mg total) by mouth daily. 90 tablet 3   mometasone (NASONEX) 50 MCG/ACT nasal spray One spray in each nostril twice a day, use left hand for right nostril, and right hand for left nostril.  Please dispense one bottle. 1 g 6   montelukast (SINGULAIR) 10 MG tablet TAKE 1 TABLET(10 MG) BY MOUTH AT BEDTIME 30 tablet 3   mupirocin ointment (BACTROBAN) 2 % Apply 1 Application topically daily. 22 g 3   nitroGLYCERIN (NITROSTAT) 0.4 MG SL tablet Place 1 tablet (0.4 mg total) under the tongue every 5 (five) minutes as needed for chest pain. 25 tablet 6   nystatin ointment (MYCOSTATIN) Apply 1 Application topically 2 (two) times daily. 30 g 2   olopatadine (PATADAY) 0.1 % ophthalmic solution Place 1  drop into both eyes 2 (two) times daily. 5 mL 3   pantoprazole (PROTONIX) 40 MG tablet Take 1 tablet (40 mg total) by mouth daily. 30 tablet 3   pregabalin (LYRICA) 200 MG capsule TAKE 1 CAPSULE BY MOUTH TWICE DAILY 60 capsule 5   sucralfate (CARAFATE) 1 g tablet TAKE 1 TABLET(1 GRAM) BY MOUTH FOUR TIMES DAILY AT BEDTIME WITH MEALS 120 tablet 0   terbinafine (LAMISIL) 250 MG tablet Take 1 tablet (250 mg total) by mouth daily. 14 tablet 0   topiramate (TOPAMAX) 100 MG tablet Take by mouth 2 (two) times daily.     traZODone (DESYREL) 100 MG tablet TAKE 1 TABLET(100 MG) BY MOUTH AT BEDTIME AS NEEDED FOR SLEEP 90 tablet 0   No current facility-administered medications for this visit.  Allergies as of 03/24/2023 - Reviewed 02/26/2023  Allergen Reaction Noted   Tramadol  01/27/2021   Nsaids Other (See Comments) 09/26/2020   Sulfamethoxazole-trimethoprim Nausea And Vomiting 12/22/2020   Ibuprofen  09/30/2017    Family History  Problem Relation Age of Onset   Heart disease Mother    Heart disease Sister     Review of Systems:    Constitutional: No weight loss, fever, chills, weakness or fatigue HEENT: Eyes: No change in vision               Ears, Nose, Throat:  No change in hearing or congestion Skin: No rash or itching Cardiovascular: No chest pain, chest pressure or palpitations   Respiratory: No SOB or cough Gastrointestinal: See HPI and otherwise negative Genitourinary: No dysuria or change in urinary frequency Neurological: No headache, dizziness or syncope Musculoskeletal: No new muscle or joint pain Hematologic: No bleeding or bruising Psychiatric: No history of depression or anxiety    Physical Exam:  Vital signs: There were no vitals taken for this visit.  Constitutional:   Pleasant Caucasian female*** appears to be in NAD, Well developed, Well nourished, alert and cooperative Head:  Normocephalic and atraumatic. Eyes:   PEERL, EOMI. No icterus. Conjunctiva  pink. Ears:  Normal auditory acuity. Neck:  Supple Throat: Oral cavity and pharynx without inflammation, swelling or lesion.  Respiratory: Respirations even and unlabored. Lungs clear to auscultation bilaterally.   No wheezes, crackles, or rhonchi.  Cardiovascular: Normal S1, S2. Regular rate and rhythm. No peripheral edema, cyanosis or pallor.  Gastrointestinal:  Soft, nondistended, nontender. No rebound or guarding. Normal bowel sounds. No appreciable masses or hepatomegaly. Rectal:  Not performed.  Anoscopy: Msk:  Symmetrical without gross deformities. Without edema, no deformity or joint abnormality.  Neurologic:  Alert and  oriented x4;  grossly normal neurologically.  Skin:   Dry and intact without significant lesions or rashes. Psychiatric: Oriented to person, place and time. Demonstrates good judgement and reason without abnormal affect or behaviors.  RELEVANT LABS AND IMAGING: CBC    Latest Ref Rng & Units 02/26/2023    4:48 PM 07/20/2022    4:40 PM 04/12/2021    1:26 PM  CBC  WBC 4.0 - 10.5 K/uL 7.7  9.2  7.7   Hemoglobin 12.0 - 15.0 g/dL 16.1  09.6  04.5   Hematocrit 36.0 - 46.0 % 43.4  45.9  43.5   Platelets 150 - 400 K/uL 208  194  233      CMP     Latest Ref Rng & Units 02/26/2023    4:48 PM 07/20/2022    6:32 PM 07/20/2022    4:40 PM  CMP  Glucose 70 - 99 mg/dL 409   811   BUN 6 - 20 mg/dL 12   24   Creatinine 9.14 - 1.00 mg/dL 7.82   9.56   Sodium 213 - 145 mmol/L 137   133   Potassium 3.5 - 5.1 mmol/L 3.9   4.4   Chloride 98 - 111 mmol/L 103   98   CO2 22 - 32 mmol/L 26   20   Calcium 8.9 - 10.3 mg/dL 9.3   9.4   Total Protein 6.5 - 8.1 g/dL 6.9  7.5    Total Bilirubin <1.2 mg/dL 0.3  1.0    Alkaline Phos 38 - 126 U/L 53  69    AST 15 - 41 U/L 41  55    ALT 0 - 44 U/L 48  55    03/18/2023 labs show: Sodium 133, glucose 336, BUN 17, creatinine 1.09, alkaline phosphatase 63, ALT 45, ALT 34, total bilirubin 0.4, CK total 84, B12 197, vitamin D 30.1, acute  hepatitis screening panel nonreactive, HA1C 11.2, triglycerides 323 02/26/2023 labs show lipase 42  07/20/2022 CT renal stone study IMPRESSION: 1. No acute localizing process in the abdomen or pelvis. 2. Hepatosplenomegaly. 3. Fatty infiltration of the liver. 4. Colonic diverticulosis.  Assessment: 1. ***  Plan: 1. ***   Thank you for the courtesy of this consult. Please call me with any questions or concerns.   Yandriel Boening, FNP-C [facility] 03/23/2023, 9:18 AM  Cc: Suzan Slick, MD

## 2023-03-24 ENCOUNTER — Ambulatory Visit: Payer: BLUE CROSS/BLUE SHIELD | Admitting: Gastroenterology

## 2023-03-25 NOTE — Progress Notes (Deleted)
Cardiology Clinic Note   Patient Name: Jean Calderon Date of Encounter: 03/25/2023  Primary Care Provider:  Suzan Slick, MD Primary Cardiologist:  None  Patient Profile    Jean Calderon presents to the clinic today for follow-up evaluation of her coronary artery disease and lower extremity claudication.  Past Medical History    Past Medical History:  Diagnosis Date   CAD (coronary artery disease)    Cervical radiculopathy 08/15/2017   CKD (chronic kidney disease), stage II    COVID-19 01/2022   DM (diabetes mellitus) (HCC)    HTN (hypertension)    Hyperlipidemia    Lumbar radiculopathy 06/21/2019   Last Assessment & Plan: Formatting of this note might be different from the original. Scheduled for laminectomy L5 4/27 Pre-op complete at hospital; labs and ekg reviewed. Scheduled for cardiology eval in one day Medical cleared for procedure by IM evaluation.   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 Severe tendinosis of the supraspinatus tendon.  2. Mild tendinosis of the infraspinatus tendon.  3. Thickening of the inferior joint capsule and intermediate signal  material effacing the normal subcoracoid fat as can be seen with  adhesive capsulitis.    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   Past Surgical History:  Procedure Laterality Date   APPENDECTOMY     BACK SURGERY     CESAREAN SECTION     CORONARY ANGIOPLASTY WITH STENT PLACEMENT     MULTIPLE TOOTH EXTRACTIONS     ROTATOR CUFF REPAIR      Allergies  Allergies  Allergen Reactions   Tramadol     Other reaction(s): Mental Status Changes (intolerance) Caused Hallucinations   Nsaids Other (See Comments)    Cannot take because she's on Plavix.   Other reaction(s): Bleeding (intolerance) Cannot take because she's on Plavix.     Sulfamethoxazole-Trimethoprim Nausea And Vomiting    History of Colitis   Ibuprofen     Not an allergy. Pt stated she can't take it because of current use of ASA and Plavix    History of Present Illness    Jean Calderon has a PMH of coronary artery disease, HTN, HLD, PVD, and lower extremity claudication.  She was seen and evaluated by Dr. Jens Som on 11/12/2022 at the request of her PCP.  She reported previous PCI in 2010.  She also underwent cardiac catheterization 3/22 which showed an EF of 55-65%, normal LVEDP, 50% proximal, 30% mid, 40% distal LAD, 30% first diagonal, 30% ramus, 30% proximal, mid, and distal, 30% OM, 30% RCA.  Echocardiogram 1/23 showed normal LVEF, mild left ventricular hypertrophy, G1 DD.  She was seen in follow-up by Dr. Jens Som on 11/12/2022.  She did note some dyspnea on exertion but denied orthopnea PND, lower extremity swelling, and chest discomfort.  She also denied syncopal episodes.  She reported bilateral lower extremity pain both at night and with ambulation.  Lower extremity arterial's and ABIs were ordered for further evaluation.  Tests were not completed.  She presents to the clinic today for follow-up evaluation and states***.  *** denies chest pain, shortness of breath, lower extremity edema, fatigue, palpitations, melena, hematuria, hemoptysis, diaphoresis, weakness, presyncope, syncope, orthopnea, and PND.  Coronary artery disease-denies chest pain today.  Denies exertional chest discomfort. Continue current medical therapy Heart healthy low-sodium diet Increase physical activity as tolerated  No plans for ischemic evaluation at this time  Hyperlipidemia-LDL***.  During last visit her atorvastatin was increased.  She is tolerating atorvastatin well. Continue atorvastatin, aspirin Repeat fasting lipids and LFTs  Essential hypertension-BP today***. Maintain blood pressure log Continue Imdur, metoprolol  Peripheral vascular disease-continues to note  episodes of lower extremity pain with ambulation and through the night.  Reviewed need for lower extremity ultrasounds.  She expressed understanding. Reorder LEAs and ABIs Continue aspirin, clopidogrel Heart healthy low-sodium high-fiber diet Increase physical activity as tolerated  Disposition: Follow-up with Dr. Jens Som or me in 3-4 months.  Home Medications    Prior to Admission medications   Medication Sig Start Date End Date Taking? Authorizing Provider  acetaminophen (TYLENOL) 325 MG tablet Take 2 tablets (650 mg total) by mouth every 6 (six) hours as needed for moderate pain. 04/09/22   Wallis Bamberg, PA-C  albuterol (VENTOLIN HFA) 108 (90 Base) MCG/ACT inhaler Inhale 2 puffs into the lungs every 4 (four) hours as needed for shortness of breath or wheezing. 01/22/22   Lattie Haw, MD  aspirin EC 81 MG tablet Take 81 mg by mouth daily.    [provider]  atorvastatin (LIPITOR) 40 MG tablet Take 1 tablet (40 mg total) by mouth daily. 06/14/22   Suzan Slick, MD  benzonatate (TESSALON) 200 MG capsule Take 1 capsule (200 mg total) by mouth 3 (three) times daily as needed for cough. 01/26/23   Eustace Moore, MD  chlorhexidine (HIBICLENS) 4 % external liquid Apply topically daily as needed. 12/06/22   Suzan Slick, MD  clopidogrel (PLAVIX) 75 MG tablet Take 1 tablet (75 mg total) by mouth daily. 02/14/23   Suzan Slick, MD  Continuous Blood Gluc Receiver (DEXCOM G7 RECEIVER) DEVI Check sugars daily Dx E11.9 04/05/22   Suzan Slick, MD  Continuous Blood Gluc Sensor (DEXCOM G7 SENSOR) MISC Check sugars daily Dx E11.9 04/05/22   Suzan Slick, MD  Continuous Glucose Transmitter (DEXCOM G6 TRANSMITTER) MISC  07/01/22   [provider]  Crisaborole (EUCRISA) 2 % OINT Apply 1 Application topically 2 (two) times daily. 06/11/20   [provider]  Estradiol-Norethindrone Acet 0.5-0.1 MG tablet Take 1 tablet by mouth daily. 12/06/22   Suzan Slick,  MD  fexofenadine (ALLEGRA) 180 MG tablet Take 1 tablet (180 mg total) by mouth daily. 05/06/22   Rucker, Magdalen Spatz, MD  glipiZIDE (GLUCOTROL XL) 5 MG 24 hr tablet Take 5 mg by mouth daily with breakfast.    [provider]  HUMALOG 100 UNIT/ML injection INJECT 25 UNITS INTO THE SKIN 3 TIMES DAILY BEFORE MEALS. USE INSULIN PUMP 05/12/22   Suzan Slick, MD  hydrOXYzine (ATARAX) 25 MG tablet Take 1 tablet (25 mg total) by mouth every 8 (eight) hours as needed. 02/14/23   Suzan Slick, MD  Insulin Aspart, w/Niacinamide, (FIASP) 100 UNIT/ML SOLN To be used with insulin pump. 07/08/22   [provider]  Insulin Disposable Pump (OMNIPOD 5 G6 PODS, GEN 5,) MISC CHANGE POD EVERY OTHER DAY. 06/07/22   Suzan Slick, MD  isosorbide mononitrate (IMDUR) 30 MG 24 hr tablet TAKE 1 TABLET(30 MG) BY MOUTH DAILY 02/21/23   Rucker, Magdalen Spatz, MD  JARDIANCE 25 MG TABS tablet Take 1 tablet (25 mg total) by mouth daily. 06/15/22   Suzan Slick, MD  ketoconazole (NIZORAL) 2 % shampoo Apply to affected areas 3 x a week for 8 weeks 11/18/22   Suzan Slick,  MD  metoprolol succinate (TOPROL-XL) 100 MG 24 hr tablet Take 1 tablet (100 mg total) by mouth daily. 11/12/22   Lewayne Bunting, MD  mometasone (NASONEX) 50 MCG/ACT nasal spray One spray in each nostril twice a day, use left hand for right nostril, and right hand for left nostril.  Please dispense one bottle. 05/24/22   Rucker, Magdalen Spatz, MD  montelukast (SINGULAIR) 10 MG tablet TAKE 1 TABLET(10 MG) BY MOUTH AT BEDTIME 10/26/22   Suzan Slick, MD  mupirocin ointment (BACTROBAN) 2 % Apply 1 Application topically daily. 12/06/22   Suzan Slick, MD  nitroGLYCERIN (NITROSTAT) 0.4 MG SL tablet Place 1 tablet (0.4 mg total) under the tongue every 5 (five) minutes as needed for chest pain. 11/12/22   Lewayne Bunting, MD  nystatin ointment (MYCOSTATIN) Apply 1 Application topically 2 (two) times daily. 07/22/22   Rucker, Magdalen Spatz, MD   olopatadine (PATADAY) 0.1 % ophthalmic solution Place 1 drop into both eyes 2 (two) times daily. 05/24/22   Suzan Slick, MD  pantoprazole (PROTONIX) 40 MG tablet Take 1 tablet (40 mg total) by mouth daily. 07/22/22   Rucker, Magdalen Spatz, MD  pregabalin (LYRICA) 200 MG capsule TAKE 1 CAPSULE BY MOUTH TWICE DAILY 11/09/22   Rucker, Magdalen Spatz, MD  sucralfate (CARAFATE) 1 g tablet TAKE 1 TABLET(1 GRAM) BY MOUTH FOUR TIMES DAILY AT BEDTIME WITH MEALS 11/24/22   Suzan Slick, MD  terbinafine (LAMISIL) 250 MG tablet Take 1 tablet (250 mg total) by mouth daily. 11/18/22   Suzan Slick, MD  topiramate (TOPAMAX) 100 MG tablet Take by mouth 2 (two) times daily. 08/17/21   [provider]  traZODone (DESYREL) 100 MG tablet TAKE 1 TABLET(100 MG) BY MOUTH AT BEDTIME AS NEEDED FOR SLEEP 02/14/23   Suzan Slick, MD    Family History    Family History  Problem Relation Age of Onset   Heart disease Mother    Heart disease Sister    She indicated that her mother is deceased. She indicated that her father is alive. She indicated that the status of her sister is unknown.  Social History    Social History   Socioeconomic History   Marital status: Married    Spouse name: Not on file   Number of children: 4   Years of education: Not on file   Highest education level: Not on file  Occupational History   Not on file  Tobacco Use   Smoking status: Former    Types: Cigarettes   Smokeless tobacco: Never  Vaping Use   Vaping status: Never Used  Substance and Sexual Activity   Alcohol use: Never   Drug use: Never   Sexual activity: Not on file  Other Topics Concern   Not on file  Social History Narrative   Not on file   Social Drivers of Health   Financial Resource Strain: Not on file  Food Insecurity: Patient Declined (06/06/2022)   Received from [facility], [facility]   Hunger Vital Sign    Worried About Running Out of Food in the Last Year: Patient declined    Ran  Out of Food in the Last Year: Patient declined  Transportation Needs: No Transportation Needs (06/10/2022)   Received from [employer], [facility]   PRAPARE - Transportation    Lack of Transportation (Medical): No    Lack of Transportation (Non-Medical): No  Physical Activity: Not on file  Stress: No Stress Concern Present (  06/06/2022)   Received from [facility], [facility]   [employer] of Occupational Health - Occupational Stress Questionnaire    Feeling of Stress : Not at all  Social Connections: Unknown (04/23/2022)   Received from [facility], [facility]   Social Network    Social Network: Not on file  Intimate Partner Violence: Not At Risk (02/24/2023)   Received from [facility]   HITS    Over the last 12 months how often did your partner physically hurt you?: Never    Over the last 12 months how often did your partner insult you or talk down to you?: Never    Over the last 12 months how often did your partner threaten you with physical harm?: Never    Over the last 12 months how often did your partner scream or curse at you?: Never     Review of Systems    General:  No chills, fever, night sweats or weight changes.  Cardiovascular:  No chest pain, dyspnea on exertion, edema, orthopnea, palpitations, paroxysmal nocturnal dyspnea. Dermatological: No rash, lesions/masses Respiratory: No cough, dyspnea Urologic: No hematuria, dysuria Abdominal:   No nausea, vomiting, diarrhea, bright red blood per rectum, melena, or hematemesis Neurologic:  No visual changes, wkns, changes in mental status. All other systems reviewed and are otherwise negative except as noted above.  Physical Exam    VS:  There were no vitals taken for this visit. , BMI There is no height or weight on file to calculate BMI. GEN: Well nourished, well developed, in no acute distress. HEENT: normal. Neck: Supple, no JVD, carotid bruits, or masses. Cardiac: RRR, no murmurs, rubs, or  gallops. No clubbing, cyanosis, edema.  Radials/DP/PT 2+ and equal bilaterally.  Respiratory:  Respirations regular and unlabored, clear to auscultation bilaterally. GI: Soft, nontender, nondistended, BS + x 4. MS: no deformity or atrophy. Skin: warm and dry, no rash. Neuro:  Strength and sensation are intact. Psych: Normal affect.  Accessory Clinical Findings    Recent Labs: 02/26/2023: ALT 48; BUN 12; Creatinine, Ser 1.00; Hemoglobin 13.9; Platelets 208; Potassium 3.9; Sodium 137   Recent Lipid Panel    Component Value Date/Time   CHOL 131 03/13/2021 0136   TRIG 653 (H) 03/13/2021 0136   HDL 27 (L) 03/13/2021 0136   CHOLHDL 4.9 03/13/2021 0136   VLDL UNABLE TO CALCULATE IF TRIGLYCERIDE OVER 400 mg/dL 82/95/6213 0865   LDLCALC UNABLE TO CALCULATE IF TRIGLYCERIDE OVER 400 mg/dL 78/46/9629 5284   LDLDIRECT 39.9 03/13/2021 0136    No BP recorded.  {Refresh Note OR Click here to enter BP  :1}***    ECG personally reviewed by me today- ***          Assessment & Plan   1.  ***   Thomasene Ripple. Daaiel Starlin NP-C     03/25/2023, 6:37 AM [facility] [address] Office [phone] Fax [phone]    I spent***minutes examining this patient, reviewing medications, and using patient centered shared decision making involving their cardiac care.   I spent greater than 20 minutes reviewing their past medical history,  medications, and prior cardiac tests.

## 2023-03-28 ENCOUNTER — Ambulatory Visit: Payer: BLUE CROSS/BLUE SHIELD | Attending: General Practice | Admitting: General Practice

## 2023-03-30 ENCOUNTER — Ambulatory Visit (HOSPITAL_COMMUNITY)
Admission: RE | Admit: 2023-03-30 | Discharge: 2023-03-30 | Disposition: A | Discharge status: Home / Self Care | Payer: BLUE CROSS/BLUE SHIELD | Source: Ambulatory Visit | Attending: Cardiology | Admitting: Cardiology

## 2023-03-30 ENCOUNTER — Ambulatory Visit (HOSPITAL_BASED_OUTPATIENT_CLINIC_OR_DEPARTMENT_OTHER)
Admission: RE | Admit: 2023-03-30 | Discharge: 2023-03-30 | Disposition: A | Discharge status: Home / Self Care | Payer: BLUE CROSS/BLUE SHIELD | Source: Ambulatory Visit | Attending: Cardiology | Admitting: Cardiology

## 2023-03-30 DIAGNOSIS — I739 Peripheral vascular disease, unspecified: Secondary | ICD-10-CM | POA: Diagnosis not present

## 2023-03-31 ENCOUNTER — Other Ambulatory Visit: Payer: Self-pay | Admitting: Nurse Practitioner

## 2023-03-31 ENCOUNTER — Encounter (HOSPITAL_BASED_OUTPATIENT_CLINIC_OR_DEPARTMENT_OTHER): Payer: Self-pay | Admitting: *Deleted

## 2023-03-31 ENCOUNTER — Other Ambulatory Visit: Payer: Self-pay | Admitting: Family Medicine

## 2023-03-31 DIAGNOSIS — Z1231 Encounter for screening mammogram for malignant neoplasm of breast: Secondary | ICD-10-CM

## 2023-03-31 DIAGNOSIS — E1142 Type 2 diabetes mellitus with diabetic polyneuropathy: Secondary | ICD-10-CM

## 2023-04-01 ENCOUNTER — Encounter: Payer: Self-pay | Admitting: *Deleted

## 2023-04-05 NOTE — Progress Notes (Deleted)
  Cardiology Office Note:  .   Date:  04/05/2023  ID:  Jean Calderon, DOB 1970/10/11, MRN [id number] PCP: Colette Torrence GRADE, MD  [facility] Providers Cardiologist:  Durand Shallow }   History of Present Illness: .   Jean Calderon is a 53 y.o. female with history of coronary artery disease cardiac catheterization March 2022 showed EF of 65% normal LV end-diastolic pressure, 50% proximal, 30% mid, 40% distal LAD with 30% first diagonal, 30% ramus, 30% proximal, mid distal complex, 30% OM, 30% RCA.  Last seen by Dr. Shallow on 11/12/2022.  Other history includes chronic kidney disease stage II, hypertension, hyperlipidemia, chronic dyspnea, and diabetes.  ROS: ***  Studies Reviewed: .        *** EKG Interpretation Date/Time:    Ventricular Rate:    PR Interval:    QRS Duration:    QT Interval:    QTC Calculation:   R Axis:      Text Interpretation:      Physical Exam:   VS:  There were no vitals taken for this visit.   Wt Readings from Last 3 Encounters:  01/26/23 275 lb (124.7 kg)  11/24/22 283 lb 14.4 oz (128.8 kg)  11/18/22 279 lb (126.6 kg)    GEN: Well nourished, well developed in no acute distress NECK: No JVD; No carotid bruits CARDIAC: ***RRR, no murmurs, rubs, gallops RESPIRATORY:  Clear to auscultation without rales, wheezing or rhonchi  ABDOMEN: Soft, non-tender, non-distended EXTREMITIES:  No edema; No deformity   ASSESSMENT AND PLAN: .   ***    {Are you ordering a CV Procedure (e.g. stress test, cath, DCCV, TEE, etc)?   Press F2        :[id number]}    Signed, Lamarr HERO. Jerilynn CHOL, ANP, AACC

## 2023-04-07 ENCOUNTER — Ambulatory Visit: Payer: BLUE CROSS/BLUE SHIELD | Attending: Adult Health | Admitting: Adult Health

## 2023-04-13 NOTE — Progress Notes (Deleted)
 NEW PATIENT Date of Service/Encounter:  04/13/23 Referring provider: Suzan Slick, MD Primary care provider: Suzan Slick, MD  Subjective:  Jean Calderon is a 53 y.o. female with a PMHx of *** presenting today for evaluation of *** History obtained from: chart review and {Persons; PED relatives w/patient:19415::"patient"}.   Discussed the use of AI scribe software for clinical note transcription with the patient, who gave verbal consent to proceed.  History of Present Illness            Chart Review:  Reviewed PCP notes from referral 11/24/22: "Pt also has hx of allergic rhinitis and pruritus. Needs refills on her Atarax that she uses prn for itching. She continues to have recurrent nasal congestion despite using nasal steroid spray and Singulair. Allergy symptoms exacerbating and not relieved with current treatment. "  Other allergy screening: Asthma: {Blank single:19197::"yes","no"} Rhino conjunctivitis: {Blank single:19197::"yes","no"} Food allergy: {Blank single:19197::"yes","no"} Medication allergy: {Blank single:19197::"yes","no"} Hymenoptera allergy: {Blank single:19197::"yes","no"} Urticaria: {Blank single:19197::"yes","no"} Eczema:{Blank single:19197::"yes","no"} History of recurrent infections suggestive of immunodeficency: {Blank single:19197::"yes","no"} ***Vaccinations are up to date.   Past Medical History: Past Medical History:  Diagnosis Date   CAD (coronary artery disease)    Cervical radiculopathy 08/15/2017   CKD (chronic kidney disease), stage II    COVID-19 01/2022   DM (diabetes mellitus) (HCC)    HTN (hypertension)    Hyperlipidemia    Lumbar radiculopathy 06/21/2019   Last Assessment & Plan: Formatting of this note might be different from the original. Scheduled for laminectomy L5 4/27 Pre-op complete at hospital; labs and ekg reviewed. Scheduled for cardiology eval in one day Medical cleared for procedure by IM evaluation.   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 Severe tendinosis of the supraspinatus tendon.  2. Mild tendinosis of the infraspinatus tendon.  3. Thickening of the inferior joint capsule and intermediate signal  material effacing the normal subcoracoid fat as can be seen with  adhesive capsulitis.    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   Medication List:  Current Outpatient Medications  Medication Sig Dispense Refill   acetaminophen (TYLENOL) 325 MG tablet Take 2 tablets (650 mg total) by mouth every 6 (six) hours as needed for moderate pain. 30 tablet 0   albuterol (VENTOLIN HFA) 108 (90 Base) MCG/ACT inhaler Inhale 2 puffs into the lungs every 4 (four) hours as needed for shortness of breath or wheezing. 1 each 1   aspirin EC 81 MG tablet Take 81 mg by mouth daily.     atorvastatin (LIPITOR) 40 MG tablet TAKE 1 TABLET(40 MG) BY MOUTH DAILY 90 tablet 1   benzonatate (TESSALON) 200 MG capsule Take 1 capsule (200 mg total) by mouth 3 (three) times daily as needed for cough. 21 capsule 0   chlorhexidine (HIBICLENS) 4 % external liquid Apply topically daily as needed. 946 mL 1   clopidogrel (PLAVIX) 75 MG tablet Take 1 tablet (75 mg total) by mouth daily. 90 tablet 1   Continuous Blood Gluc Receiver (DEXCOM G7 RECEIVER) DEVI Check sugars daily Dx E11.9 1 each 2   Continuous Blood Gluc Sensor (DEXCOM G7 SENSOR) MISC Check sugars daily Dx E11.9 1 each 5   Continuous Glucose Transmitter (DEXCOM G6 TRANSMITTER) MISC      Crisaborole (EUCRISA) 2 % OINT Apply 1 Application topically 2 (two) times daily.  Estradiol-Norethindrone Acet 0.5-0.1 MG tablet Take 1 tablet by mouth daily. 84 tablet 0   fexofenadine (ALLEGRA) 180 MG tablet Take 1 tablet (180 mg total) by mouth daily. 90 tablet 3   glipiZIDE (GLUCOTROL XL) 5 MG 24 hr tablet Take 5 mg  by mouth daily with breakfast.     HUMALOG 100 UNIT/ML injection INJECT 25 UNITS INTO THE SKIN 3 TIMES DAILY BEFORE MEALS. USE INSULIN PUMP 10 mL 1   hydrOXYzine (ATARAX) 25 MG tablet Take 1 tablet (25 mg total) by mouth every 8 (eight) hours as needed. 45 tablet 1   Insulin Aspart, w/Niacinamide, (FIASP) 100 UNIT/ML SOLN To be used with insulin pump.     Insulin Disposable Pump (OMNIPOD 5 G6 PODS, GEN 5,) MISC CHANGE POD EVERY OTHER DAY. 30 each 0   isosorbide mononitrate (IMDUR) 30 MG 24 hr tablet TAKE 1 TABLET(30 MG) BY MOUTH DAILY 90 tablet 1   JARDIANCE 25 MG TABS tablet Take 1 tablet (25 mg total) by mouth daily. 30 tablet 0   ketoconazole (NIZORAL) 2 % shampoo Apply to affected areas 3 x a week for 8 weeks 120 mL 1   metoprolol succinate (TOPROL-XL) 100 MG 24 hr tablet Take 1 tablet (100 mg total) by mouth daily. 90 tablet 3   mometasone (NASONEX) 50 MCG/ACT nasal spray One spray in each nostril twice a day, use left hand for right nostril, and right hand for left nostril.  Please dispense one bottle. 1 g 6   montelukast (SINGULAIR) 10 MG tablet TAKE 1 TABLET(10 MG) BY MOUTH AT BEDTIME 30 tablet 3   mupirocin ointment (BACTROBAN) 2 % Apply 1 Application topically daily. 22 g 3   nitroGLYCERIN (NITROSTAT) 0.4 MG SL tablet Place 1 tablet (0.4 mg total) under the tongue every 5 (five) minutes as needed for chest pain. 25 tablet 6   nystatin ointment (MYCOSTATIN) Apply 1 Application topically 2 (two) times daily. 30 g 2   olopatadine (PATADAY) 0.1 % ophthalmic solution Place 1 drop into both eyes 2 (two) times daily. 5 mL 3   pantoprazole (PROTONIX) 40 MG tablet Take 1 tablet (40 mg total) by mouth daily. 30 tablet 3   pregabalin (LYRICA) 200 MG capsule TAKE 1 CAPSULE BY MOUTH TWICE DAILY 60 capsule 5   sucralfate (CARAFATE) 1 g tablet TAKE 1 TABLET(1 GRAM) BY MOUTH FOUR TIMES DAILY AT BEDTIME WITH MEALS 120 tablet 0   terbinafine (LAMISIL) 250 MG tablet Take 1 tablet (250 mg total) by mouth  daily. 14 tablet 0   topiramate (TOPAMAX) 100 MG tablet Take by mouth 2 (two) times daily.     traZODone (DESYREL) 100 MG tablet TAKE 1 TABLET(100 MG) BY MOUTH AT BEDTIME AS NEEDED FOR SLEEP 90 tablet 0   No current facility-administered medications for this visit.   Known Allergies:  Allergies  Allergen Reactions   Tramadol     Other reaction(s): Mental Status Changes (intolerance) Caused Hallucinations   Nsaids Other (See Comments)    Cannot take because she's on Plavix.   Other reaction(s): Bleeding (intolerance) Cannot take because she's on Plavix.    Sulfamethoxazole-Trimethoprim Nausea And Vomiting    History of Colitis   Ibuprofen     Not an allergy. Pt stated she can't take it because of current use of ASA and Plavix   Past Surgical History: Past Surgical History:  Procedure Laterality Date   APPENDECTOMY     BACK SURGERY     CESAREAN SECTION  CORONARY ANGIOPLASTY WITH STENT PLACEMENT     MULTIPLE TOOTH EXTRACTIONS     ROTATOR CUFF REPAIR     Family History: Family History  Problem Relation Age of Onset   Heart disease Mother    Heart disease Sister    Social History: [place] lives ***.   ROS:  All other systems negative except as noted per HPI.  Objective:  There were no vitals taken for this visit. There is no height or weight on file to calculate BMI. Physical Exam:  General Appearance:  Alert, cooperative, no distress, appears stated age  Head:  Normocephalic, without obvious abnormality, atraumatic  Eyes:  Conjunctiva clear, EOM's intact  Ears {Blank multiple:19196:a:"***","EACs normal bilaterally","normal TMs bilaterally","ear tubes present bilaterally without exudate"}  Nose: Nares normal, {Blank multiple:19196:a:"***","hypertrophic turbinates","normal mucosa","no visible anterior polyps","septum midline"}  Throat: Lips, tongue normal; teeth and gums normal, {Blank multiple:19196:a:"***","normal posterior oropharynx","tonsils 2+","tonsils 3+","no  tonsillar exudate","+ cobblestoning","surgically absent tonsils","mildly erythematous posterior oropharynx"}  Neck: Supple, symmetrical  Lungs:   {Blank multiple:19196:a:"***","clear to auscultation bilaterally","end-expiratory wheezing","wheezing throughout"}, Respirations unlabored, {Blank multiple:19196:a:"***","no coughing","intermittent dry coughing","intermittent productive-sounding cough"}  Heart:  {Blank multiple:19196:a:"***","regular rate and rhythm","no murmur"}, Appears well perfused  Extremities: No edema  Skin: {Blank multiple:19196:a:"***","erythematous, dry patches scattered on ***","lichenification on ***","Skin color, texture, turgor normal","no rashes or lesions on visualized portions of skin"}  Neurologic: No gross deficits   Diagnostics: Spirometry:  Tracings reviewed. Her effort: {Blank single:19197::"Good reproducible efforts.","It was hard to get consistent efforts and there is a question as to whether this reflects a maximal maneuver.","Poor effort, data can not be interpreted.","Variable effort-results affected","effort okay for first attempt at spirometry.","Results not reproducible due to ***"} FVC: ***L (pre), ***L  (post) FEV1: ***L, ***% predicted (pre), ***L, ***% predicted (post) FEV1/FVC ratio: *** (pre), *** (post) Interpretation: {Blank single:19197::"Spirometry consistent with mild obstructive disease","Spirometry consistent with moderate obstructive disease","Spirometry consistent with severe obstructive disease","Spirometry consistent with possible restrictive disease","Spirometry consistent with mixed obstructive and restrictive disease","Spirometry uninterpretable due to technique","Spirometry consistent with normal pattern","No overt abnormalities noted given today's efforts","Nonobstructive ratio, low FEV1","Nonobstructive ratio, low FEV1, possible restriction"}.  Please see scanned spirometry results for details.  Skin Testing: {Blank single:19197::"Select  foods","Environmental allergy panel","Environmental allergy panel and select foods","Food allergy panel","None","Deferred due to recent antihistamines use","deferred due to recent reaction","Pediatric Environmental Allergy Panel","Pediatric Food Panel","Select foods and environmental allergies"}. {Blank single:19197::"Adequate positive and negative controls","Inadequate positive control-testing invalid","Adequate positive and negative controls, dermatographism present, testing difficult to interpret"}. Results discussed with patient/family.   {Blank single:19197::"Allergy testing results were read and interpreted by myself, documented by clinical staff.","Allergy testing results were read by ***,FNP, documented by clinical staff"}  Labs:  Lab Orders  No laboratory test(s) ordered today     Assessment and Plan  Assessment and Plan               {Blank single:19197::"This note in its entirety was forwarded to the Provider who requested this consultation."}  Other: {Blank multiple:19196:a:"***","samples provided of: ***","school forms provided","reviewed spirometry technique","reviewed inhaler technique"}  Thank you for your kind referral. I appreciate the opportunity to take part in Salsabeel's care. Please do not hesitate to contact me with questions.***  Sincerely,  Tonny Bollman, MD [facility]

## 2023-04-14 ENCOUNTER — Ambulatory Visit: Payer: BLUE CROSS/BLUE SHIELD | Admitting: Internal Medicine

## 2023-04-21 ENCOUNTER — Encounter (HOSPITAL_BASED_OUTPATIENT_CLINIC_OR_DEPARTMENT_OTHER): Payer: Self-pay

## 2023-04-22 ENCOUNTER — Other Ambulatory Visit: Payer: Self-pay | Admitting: Family Medicine

## 2023-04-22 DIAGNOSIS — J301 Allergic rhinitis due to pollen: Secondary | ICD-10-CM

## 2023-05-04 ENCOUNTER — Ambulatory Visit (INDEPENDENT_AMBULATORY_CARE_PROVIDER_SITE_OTHER): Payer: BLUE CROSS/BLUE SHIELD | Admitting: Gastroenterology

## 2023-05-04 ENCOUNTER — Telehealth: Payer: Self-pay

## 2023-05-04 ENCOUNTER — Encounter: Payer: Self-pay | Admitting: Gastroenterology

## 2023-05-04 ENCOUNTER — Other Ambulatory Visit (INDEPENDENT_AMBULATORY_CARE_PROVIDER_SITE_OTHER)

## 2023-05-04 VITALS — BP 118/62 | HR 66 | Ht 69.0 in | Wt 282.0 lb

## 2023-05-04 DIAGNOSIS — K760 Fatty (change of) liver, not elsewhere classified: Secondary | ICD-10-CM

## 2023-05-04 DIAGNOSIS — Z8719 Personal history of other diseases of the digestive system: Secondary | ICD-10-CM | POA: Diagnosis not present

## 2023-05-04 DIAGNOSIS — K219 Gastro-esophageal reflux disease without esophagitis: Secondary | ICD-10-CM | POA: Diagnosis not present

## 2023-05-04 DIAGNOSIS — R0789 Other chest pain: Secondary | ICD-10-CM | POA: Diagnosis not present

## 2023-05-04 DIAGNOSIS — Z1211 Encounter for screening for malignant neoplasm of colon: Secondary | ICD-10-CM

## 2023-05-04 DIAGNOSIS — R748 Abnormal levels of other serum enzymes: Secondary | ICD-10-CM

## 2023-05-04 DIAGNOSIS — N1832 Chronic kidney disease, stage 3b: Secondary | ICD-10-CM

## 2023-05-04 DIAGNOSIS — E1165 Type 2 diabetes mellitus with hyperglycemia: Secondary | ICD-10-CM

## 2023-05-04 DIAGNOSIS — E1142 Type 2 diabetes mellitus with diabetic polyneuropathy: Secondary | ICD-10-CM

## 2023-05-04 LAB — HEPATIC FUNCTION PANEL
ALT: 49 U/L — ABNORMAL HIGH (ref 0–35)
AST: 48 U/L — ABNORMAL HIGH (ref 0–37)
Albumin: 4.1 g/dL (ref 3.5–5.2)
Alkaline Phosphatase: 93 U/L (ref 39–117)
Bilirubin, Direct: 0.1 mg/dL (ref 0.0–0.3)
Total Bilirubin: 0.4 mg/dL (ref 0.2–1.2)
Total Protein: 7.6 g/dL (ref 6.0–8.3)

## 2023-05-04 MED ORDER — PANTOPRAZOLE SODIUM 40 MG PO TBEC: 40.0000 mg | DELAYED_RELEASE_TABLET | Freq: Two times a day (BID) | ORAL | 3 refills | Status: DC

## 2023-05-04 NOTE — Telephone Encounter (Signed)
 error

## 2023-05-04 NOTE — Telephone Encounter (Signed)
-----   Message from Margarite Gouge May sent at 05/04/2023  3:55 PM EST ----- Regarding: FW: Consult Braeton Wolgamott,   Can you do below for me and send to Dr. Meridee Score. Thank you,   Deanna, NP-C ----- Message ----- From: Lemar Lofty., MD Sent: 05/04/2023   3:33 PM EST To: Beverley Fiedler, MD; Margarite Gouge May, NP Subject: RE: Consult                                    DJM, Since the MRCP was done at outside facility, I would get those images PowerShared to [place], our Nurses can make that happen. I think an EUs is reasonable and would not go to ERcP unless LFTs are significantly abnormal and based on current levels with normal alk phos and normal bilirubin, we should start with just EUS, if she is willing. Let me know if she is in agreement with this plan and we can send to [facility]. Thanks GM  JMP, you Ok with that plan? ----- Message ----- From: May, Deanna J, NP Sent: 05/04/2023   1:11 PM EST To: Lemar Lofty., MD Subject: Consult                                        Dr. Meridee Score,   I saw this patient today as new patient for Dr. Rhea Belton. She has complicated medical history and recent imaging ([place] and MRI/MRCP) in care everywhere showing abnormal CBD findings:  The common bile duct within the pancreatic head is prominent measuring up to 1.1 cm in caliber, however tapers smoothly to the ampulla without calculus or other obstruction. In retrospective  review this is unchanged when compared to examinations dating back to dated 2022 and of doubtful significance in the absence of clinical evidence of cholestasis.  2. Hepatomegaly and marked hepatic steatosis.  3. Splenomegaly.   She also had bout of pancreatitis last April. History of elevated liver enzymes. Hepatic steatosis, obesity, uncontrolled DM, lots of medications, etc  She comes in today for uncontrolled GERD and mid sternal chest pain. I was going to order EGD but wanted to check with you given the above findings if you think she needs  ERCP and EGD?  Any tips/suggestions greatly appreciated.  Thanks!  Deanna, NP-C

## 2023-05-04 NOTE — Progress Notes (Signed)
 Chief Complaint: Midsternal chest pain, acid indigestion Primary GI Doctor:Dr. Margretta Sidle  HPI:  Patient is a  53  year old female patient with past medical history of hypertension, chronic pancreatitis, CAD, diabetes type II, GERD, who was referred to me by Suzan Slick, MD on 07/22/2022, for a complaint of midsternal chest pain and acid indigestion.    On 02/26/2023 patient seen at [facility] ED for complaints of left-sided chest pain.  Patient also had nausea with few episodes of nonbloody nonbilious emesis over the past few days.  Patient also had loose stools.  EKG showed no acute changes from her baseline.  She had 2 troponins that were negative.  Chest x-ray unremarkable.  Patient discharged with referral to cardiology.  On 03/18/23 seen by PCP and due to elevated liver enzymes/RUQ abdominal pain an ultrasound was ordered. Acute hepatitis panel ordered and negative. AST 45. ALT 34. Total bilirubin 0.4.  04/07/23 US abdomen 1. No cholelithiasis or sonographic evidence for acute cholecystitis. 2. Common bile duct is prominent measuring 7.7 mm. Recommend correlation with LFTs. If there is concern for biliary obstruction, recommend MRI/MRCP. 3. Increased hepatic parenchymal echogenicity suggestive of steatosis.  04/22/23 MRI MRCP ordered 1. The common bile duct within the pancreatic head is prominent  measuring up to 1.1 cm in caliber, however tapers smoothly to the  ampulla without calculus or other obstruction. In retrospective  review this is unchanged when compared to examinations dating back  to dated 2022 and of doubtful significance in the absence of  clinical evidence of cholestasis.  2. Hepatomegaly and marked hepatic steatosis.  3. Splenomegaly.   Interval History     Patient presents with main complaint of uncontrolled GERD and chest pain. Patient has history GERD and taking pantoprazole 40 mg p.o. daily. She reports breakthrough symptoms that occur at least three  times a week which consists of regurgitation,mid sternal chest pain, and pyrosis. Patient denies dysphagia. Patient denies nausea, vomiting, or weight loss. Patient denies altered bowel habits, abdominal pain, or rectal bleeding. No alcohol use. Nonsmoker. No NSAID use. Patient taking Plavix 75 mg p.o. daily and baby aspirin 81 mg daily. Patients last colonoscopy and EGD was approximately 5 years in [place] , [place] and per patient normal. Reports she was told she is due in 5 years.  Patient's family history unknown, she was adopted. She does mention she had one episode of pancreatitis last April, per patient she was told it was due to her starting Skyrizi for psoriasis.   Wt Readings from Last 3 Encounters:  05/04/23 282 lb (127.9 kg)  01/26/23 275 lb (124.7 kg)  11/24/22 283 lb 14.4 oz (128.8 kg)    Past Medical History:  Diagnosis Date   CAD (coronary artery disease)    Cervical radiculopathy 08/15/2017   CKD (chronic kidney disease), stage II    COVID-19 01/2022   DM (diabetes mellitus) (HCC)    HTN (hypertension)    Hyperlipidemia    Lumbar radiculopathy 06/21/2019   Last Assessment & Plan: Formatting of this note might be different from the original. Scheduled for laminectomy L5 4/27 Pre-op complete at hospital; labs and ekg reviewed. Scheduled for cardiology eval in one day Medical cleared for procedure by IM evaluation.   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 Severe tendinosis of the supraspinatus tendon.  2. Mild tendinosis of the infraspinatus tendon.  3. Thickening of the  inferior joint capsule and intermediate signal  material effacing the normal subcoracoid fat as can be seen with  adhesive capsulitis.    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    Past Surgical History:  Procedure Laterality Date    APPENDECTOMY     BACK SURGERY     CESAREAN SECTION     CORONARY ANGIOPLASTY WITH STENT PLACEMENT     MULTIPLE TOOTH EXTRACTIONS     ROTATOR CUFF REPAIR      Current Outpatient Medications  Medication Sig Dispense Refill   acetaminophen (TYLENOL) 325 MG tablet Take 2 tablets (650 mg total) by mouth every 6 (six) hours as needed for moderate pain. 30 tablet 0   albuterol (VENTOLIN HFA) 108 (90 Base) MCG/ACT inhaler Inhale 2 puffs into the lungs every 4 (four) hours as needed for shortness of breath or wheezing. 1 each 1   aspirin EC 81 MG tablet Take 81 mg by mouth daily.     atorvastatin (LIPITOR) 40 MG tablet TAKE 1 TABLET(40 MG) BY MOUTH DAILY 90 tablet 1   clopidogrel (PLAVIX) 75 MG tablet Take 1 tablet (75 mg total) by mouth daily. 90 tablet 1   Continuous Blood Gluc Receiver (DEXCOM G7 RECEIVER) DEVI Check sugars daily Dx E11.9 1 each 2   Continuous Blood Gluc Sensor (DEXCOM G7 SENSOR) MISC Check sugars daily Dx E11.9 1 each 5   Continuous Glucose Transmitter (DEXCOM G6 TRANSMITTER) MISC      Crisaborole (EUCRISA) 2 % OINT Apply 1 Application topically 2 (two) times daily.     Estradiol-Norethindrone Acet 0.5-0.1 MG tablet Take 1 tablet by mouth daily. 84 tablet 0   fexofenadine (ALLEGRA) 180 MG tablet Take 1 tablet (180 mg total) by mouth daily. 90 tablet 3   glipiZIDE (GLUCOTROL XL) 5 MG 24 hr tablet Take 5 mg by mouth daily with breakfast.     HUMALOG 100 UNIT/ML injection INJECT 25 UNITS INTO THE SKIN 3 TIMES DAILY BEFORE MEALS. USE INSULIN PUMP 10 mL 1   hydrOXYzine (ATARAX) 25 MG tablet Take 1 tablet (25 mg total) by mouth every 8 (eight) hours as needed. 45 tablet 1   Insulin Aspart, w/Niacinamide, (FIASP) 100 UNIT/ML SOLN To be used with insulin pump.     Insulin Disposable Pump (OMNIPOD 5 G6 PODS, GEN 5,) MISC CHANGE POD EVERY OTHER DAY. 30 each 0   isosorbide mononitrate (IMDUR) 30 MG 24 hr tablet TAKE 1 TABLET(30 MG) BY MOUTH DAILY 90 tablet 1   JARDIANCE 25 MG TABS tablet  Take 1 tablet (25 mg total) by mouth daily. 30 tablet 0   ketoconazole (NIZORAL) 2 % shampoo Apply to affected areas 3 x a week for 8 weeks 120 mL 1   metoprolol succinate (TOPROL-XL) 100 MG 24 hr tablet Take 1 tablet (100 mg total) by mouth daily. 90 tablet 3   mometasone (NASONEX) 50 MCG/ACT nasal spray One spray in each nostril twice a day, use left hand for right nostril, and right hand for left nostril.  Please dispense one bottle. 1 g 6   montelukast (SINGULAIR) 10 MG tablet TAKE 1 TABLET(10 MG) BY MOUTH AT BEDTIME 30 tablet 3   mupirocin ointment (BACTROBAN) 2 % Apply 1 Application topically daily. 22 g 3   nitroGLYCERIN (NITROSTAT) 0.4 MG SL tablet Place 1 tablet (0.4 mg total) under the tongue every 5 (five) minutes as needed for chest pain. 25 tablet 6   nystatin  ointment (MYCOSTATIN) Apply 1 Application topically 2 (two) times daily. 30 g 2   olopatadine (PATADAY) 0.1 % ophthalmic solution Place 1 drop into both eyes 2 (two) times daily. 5 mL 3   pantoprazole (PROTONIX) 40 MG tablet Take 1 tablet (40 mg total) by mouth daily. 30 tablet 3   pantoprazole (PROTONIX) 40 MG tablet Take 1 tablet (40 mg total) by mouth 2 (two) times daily. 180 tablet 3   pregabalin (LYRICA) 200 MG capsule TAKE 1 CAPSULE BY MOUTH TWICE DAILY 60 capsule 5   terbinafine (LAMISIL) 250 MG tablet Take 1 tablet (250 mg total) by mouth daily. 14 tablet 0   topiramate (TOPAMAX) 100 MG tablet Take by mouth 2 (two) times daily.     traZODone (DESYREL) 100 MG tablet TAKE 1 TABLET(100 MG) BY MOUTH AT BEDTIME AS NEEDED FOR SLEEP 90 tablet 0   No current facility-administered medications for this visit.    Allergies as of 05/04/2023 - Review Complete 05/04/2023  Allergen Reaction Noted   Tramadol  01/27/2021   Nsaids Other (See Comments) 09/26/2020   Sulfamethoxazole-trimethoprim Nausea And Vomiting 12/22/2020   Ibuprofen  09/30/2017    Family History  Problem Relation Age of Onset   Heart disease Mother    Heart  disease Sister     Review of Systems:    Constitutional: No weight loss, fever, chills, weakness or fatigue HEENT: Eyes: No change in vision               Ears, Nose, Throat:  No change in hearing or congestion Skin: No rash or itching Cardiovascular: No chest pain, chest pressure or palpitations   Respiratory: No SOB or cough Gastrointestinal: See HPI and otherwise negative Genitourinary: No dysuria or change in urinary frequency Neurological: No headache, dizziness or syncope Musculoskeletal: No new muscle or joint pain Hematologic: No bleeding or bruising Psychiatric: No history of depression or anxiety    Physical Exam:  Vital signs: BP 118/62   Pulse 66   Ht 5\' 9"  (1.753 m)   Wt 282 lb (127.9 kg)   BMI 41.64 kg/m   Constitutional:   Pleasant female appears to be in NAD, Well developed, Well nourished, alert and cooperative Throat: Oral cavity and pharynx without inflammation, swelling or lesion.  Respiratory: Respirations even and unlabored. Lungs clear to auscultation bilaterally.   No wheezes, crackles, or rhonchi.  Cardiovascular: Normal S1, S2. Regular rate and rhythm. No peripheral edema, cyanosis or pallor.  Gastrointestinal:  Soft, nondistended, nontender. No rebound or guarding. Normal bowel sounds. No appreciable masses or hepatomegaly. Rectal:  Not performed.  Msk:  Symmetrical without gross deformities. Without edema, no deformity or joint abnormality.  Neurologic:  Alert and  oriented x4;  grossly normal neurologically.  Skin:   lesions lower extremities, scars Psychiatric: Oriented to person, place and time. Demonstrates good judgement and reason without abnormal affect or behaviors.  RELEVANT LABS AND IMAGING: CBC    Latest Ref Rng & Units 02/26/2023    4:48 PM 07/20/2022    4:40 PM 04/12/2021    1:26 PM  CBC  WBC 4.0 - 10.5 K/uL 7.7  9.2  7.7   Hemoglobin 12.0 - 15.0 g/dL 16.1  09.6  04.5   Hematocrit 36.0 - 46.0 % 43.4  45.9  43.5   Platelets 150 -  400 K/uL 208  194  233     CMP     Latest Ref Rng & Units 05/04/2023   10:40 AM 02/26/2023  4:48 PM 07/20/2022    6:32 PM  CMP  Glucose 70 - 99 mg/dL  604    BUN 6 - 20 mg/dL  12    Creatinine 5.40 - 1.00 mg/dL  9.81    Sodium 191 - 478 mmol/L  137    Potassium 3.5 - 5.1 mmol/L  3.9    Chloride 98 - 111 mmol/L  103    CO2 22 - 32 mmol/L  26    Calcium 8.9 - 10.3 mg/dL  9.3    Total Protein 6.0 - 8.3 g/dL 7.6  6.9  7.5   Total Bilirubin 0.2 - 1.2 mg/dL 0.4  0.3  1.0   Alkaline Phos 39 - 117 U/L 93  53  69   AST 0 - 37 U/L 48  41  55   ALT 0 - 35 U/L 49  48  55   01/14/2023 ALT 74, AST 66 02/24/2023-ALT 47, AST 54 03/18/23 labs show: Otitis B surface antigen nonreactive, hepatitis B core body nonreactive, hepatitis A antibody IgM nonreactive, hepatitis C nonreactive Total bilirubin 0.4 03/17/2023 triglycerides 323, A1c 11.2  12/05/20 CT abdomen pelvis with contrast IMPRESSION: 1. Wall thickening suggested in the transverse colon possibly indicating focal colitis. Follow-up after resolution of acute process is recommended to exclude underlying neoplastic lesion. No evidence of obstruction. 2. Diffuse fatty infiltration of the liver. 3. Postoperative changes in the lower lumbar spine.  06/06/2022 CT ABD/pelvis W IV contrast 1.  Very mild pancreatic head edema suggesting early pancreatitis.  Consider correlation with right upper quadrant ultrasound.  2.  Negative for bowel obstruction.  3.  Mild diffuse liver steatosis.  01/15/23 CT ABD/pelvis with IV contrast IMPRESSION: There is hepatic steatosis and hepatosplenomegaly.  The gallbladder is present. The common duct is dilated which could be due to choledocholithiasis.  11/17/22 CT chest ABD/pelvis IMPRESSION:  CT CHEST  1.  No acute pulmonary infiltrate identified.  2.  No pleural effusion.  3.  Prominent coronary artery calcifications.   CT ABDOMEN PELVIS  1.  Suspected gastroenteritis.  2.  Negative for bowel obstruction.   3.  No evidence for acute appendicitis.  04/07/23 [place] ABD 1. No cholelithiasis or sonographic evidence for acute  cholecystitis.  2. Common bile duct is prominent measuring 7.7 mm. Recommend  correlation with LFTs. If there is concern for biliary obstruction,  recommend MRI/MRCP.  3. Increased hepatic parenchymal echogenicity suggestive of  steatosis.   04/22/23 MRI/MRCP  IMPRESSION:  1. The common bile duct within the pancreatic head is prominent  measuring up to 1.1 cm in caliber, however tapers smoothly to the  ampulla without calculus or other obstruction. In retrospective  review this is unchanged when compared to examinations dating back  to dated 2022 and of doubtful significance in the absence of  clinical evidence of cholestasis.  2. Hepatomegaly and marked hepatic steatosis.  3. Splenomegaly.   03/2021 echo- There is mild concentric left ventricular hypertrophy with normal wall motion, normal systolic function and ejection fraction '60-65%'   Assessment: Encounter Diagnoses  Name Primary?   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  Recent abdominal ultrasound showed common bile duct measuring 7.7 mm.  Follow-up MRI/MRCP showed common bile duct within the pancreatic head is prominent measuring up to 1.1 cm in caliber however unchanged when compared to examinations dating back to 2022.  Patient also has marked hepatic steatosis with splenomegaly.  She did have a bout of pancreatitis in April 2024.   At that time initial ALT reported as over 7000 in the ER repeat check was in the 60s likely lab error.  Blood glucose was 448.  Lipase was 64.  Triglycerides 448.  Patient is a non-smoker and does not consume alcohol.  Patient is adopted with no known family history.  Patient comments that she was told that the [facility] have contributed to her elevated liver enzymes and therefore it was discontinued for her psoriasis. She does have history of uncontrolled DM.  I will go ahead and increase her pantoprazole to twice daily to see if it helps with her symptoms.  I will also order a IGG4  to rule out autoimmune pancreatitis.  I will follow-up on her liver enzymes and recheck hepatic function today.  I think a upper endoscopy would be beneficial given her symptoms but first I would like to have our pancreatic specialist Dr. Meridee Score to review her imaging see if a ERCP is necessary given the findings.  Otherwise we will proceed with upper GI endoscopy.     Patient has never had colon screening surveillance, we will go ahead and schedule colonoscopy.  We will need cardiac clearance for patient's Plavix.   Plan: - check IGG4 lab r/o autoimmune -recheck hepatic function today -Increase pantoprazole 40mg  from once daily to twice daily -Hold off on scheduling upper endoscopy until I discuss with Dr. Meridee Score and Dr. Rhea Belton EGD versus ERCP with EGD. -Schedule a colonoscopy in [facility] with Dr. Rhea Belton. The risks and benefits of colonoscopy with possible polypectomy / biopsies were discussed and the patient agrees to proceed.  -Hold Plavix  5 days before procedure - will instruct when and how to resume after procedure. Risks and benefits of procedure including bleeding, perforation, infection, missed lesions, medication reactions and possible hospitalization or surgery if complications occur explained. Additional rare but real risk of cardiovascular event such as heart attack or ischemia/infarct of other organs off  Plavix explained and need to seek urgent help if this occurs. Will communicate by phone or EMR with patient's prescribing provider that to confirm holding Plavix is reasonable in this case.     Thank you for the courtesy of this consult. Please call me with any questions or concerns.   Maeby Vankleeck, FNP-C [facility] 05/04/2023, 1:03 PM  Cc: Suzan Slick, MD

## 2023-05-04 NOTE — Patient Instructions (Addendum)
 Increase pantoprazole 40mg  daily to twice daily 30 minutes before breakfast and dinner.  Your provider has requested that you go to the basement level for lab work before leaving today. Press "B" on the elevator. The lab is located at the first door on the left as you exit the elevator.  _______________________________________________________  If your blood pressure at your visit was 140/90 or greater, please contact your primary care physician to follow up on this.  _______________________________________________________  If you are age 20 or older, your body mass index should be between 23-30. Your Body mass index is 41.64 kg/m. If this is out of the aforementioned range listed, please consider follow up with your Primary Care Provider.  If you are age 22 or younger, your body mass index should be between 19-25. Your Body mass index is 41.64 kg/m. If this is out of the aformentioned range listed, please consider follow up with your Primary Care Provider.   ________________________________________________________  The [facility] providers would like to encourage you to use [facility] to communicate with providers for non-urgent requests or questions.  Due to long hold times on the telephone, sending your provider a message by [facility] may be a faster and more efficient way to get a response.  Please allow 48 business hours for a response.  Please remember that this is for non-urgent requests.  _______________________________________________________  Thank you for trusting me with your gastrointestinal care!   Margarite Gouge May, NP

## 2023-05-05 NOTE — Telephone Encounter (Signed)
 Call placed to [facility] partners and requested images from MRI MRCP to be power shared from [facility].

## 2023-05-05 NOTE — Progress Notes (Signed)
 Addendum: Reviewed and agree with assessment and management plan. I saw Dr. Elesa Hacker recommendation and appreciate his assistance Perfecto Purdy, Carie Caddy, MD

## 2023-05-06 LAB — IGG 4: IgG, Subclass 4: 20 mg/dL (ref 2–96)

## 2023-05-06 NOTE — Progress Notes (Deleted)
 HPI: FU CAD.  Patient had previous PCI in 2010.  Cardiac catheterization March 2022 showed ejection fraction 55 to 65%, normal left ventricular end-diastolic pressure, 50% proximal, 30% mid, 40% distal LAD, 30% first diagonal, 30% ramus, 30% proximal, mid and distal complex, 30% OM, 30% RCA.  Echocardiogram January 2023 showed normal LV function, mild left ventricular hypertrophy, grade 1 diastolic dysfunction.  Also with history of peripheral vascular disease.  ABIs with Doppler January 2025 showed mild atherosclerosis on the right and left but no significant stenosis; ABIs normal.  Since last seen,   Current Outpatient Medications  Medication Sig Dispense Refill   acetaminophen (TYLENOL) 325 MG tablet Take 2 tablets (650 mg total) by mouth every 6 (six) hours as needed for moderate pain. 30 tablet 0   albuterol (VENTOLIN HFA) 108 (90 Base) MCG/ACT inhaler Inhale 2 puffs into the lungs every 4 (four) hours as needed for shortness of breath or wheezing. 1 each 1   aspirin EC 81 MG tablet Take 81 mg by mouth daily.     atorvastatin (LIPITOR) 40 MG tablet TAKE 1 TABLET(40 MG) BY MOUTH DAILY 90 tablet 1   clopidogrel (PLAVIX) 75 MG tablet Take 1 tablet (75 mg total) by mouth daily. 90 tablet 1   Continuous Blood Gluc Receiver (DEXCOM G7 RECEIVER) DEVI Check sugars daily Dx E11.9 1 each 2   Continuous Blood Gluc Sensor (DEXCOM G7 SENSOR) MISC Check sugars daily Dx E11.9 1 each 5   Continuous Glucose Transmitter (DEXCOM G6 TRANSMITTER) MISC      Crisaborole (EUCRISA) 2 % OINT Apply 1 Application topically 2 (two) times daily.     Estradiol-Norethindrone Acet 0.5-0.1 MG tablet Take 1 tablet by mouth daily. 84 tablet 0   fexofenadine (ALLEGRA) 180 MG tablet Take 1 tablet (180 mg total) by mouth daily. 90 tablet 3   glipiZIDE (GLUCOTROL XL) 5 MG 24 hr tablet Take 5 mg by mouth daily with breakfast.     HUMALOG 100 UNIT/ML injection INJECT 25 UNITS INTO THE SKIN 3 TIMES DAILY BEFORE MEALS. USE INSULIN  PUMP 10 mL 1   hydrOXYzine (ATARAX) 25 MG tablet Take 1 tablet (25 mg total) by mouth every 8 (eight) hours as needed. 45 tablet 1   Insulin Aspart, w/Niacinamide, (FIASP) 100 UNIT/ML SOLN To be used with insulin pump.     Insulin Disposable Pump (OMNIPOD 5 G6 PODS, GEN 5,) MISC CHANGE POD EVERY OTHER DAY. 30 each 0   isosorbide mononitrate (IMDUR) 30 MG 24 hr tablet TAKE 1 TABLET(30 MG) BY MOUTH DAILY 90 tablet 1   JARDIANCE 25 MG TABS tablet Take 1 tablet (25 mg total) by mouth daily. 30 tablet 0   ketoconazole (NIZORAL) 2 % shampoo Apply to affected areas 3 x a week for 8 weeks 120 mL 1   metoprolol succinate (TOPROL-XL) 100 MG 24 hr tablet Take 1 tablet (100 mg total) by mouth daily. 90 tablet 3   mometasone (NASONEX) 50 MCG/ACT nasal spray One spray in each nostril twice a day, use left hand for right nostril, and right hand for left nostril.  Please dispense one bottle. 1 g 6   montelukast (SINGULAIR) 10 MG tablet TAKE 1 TABLET(10 MG) BY MOUTH AT BEDTIME 30 tablet 3   mupirocin ointment (BACTROBAN) 2 % Apply 1 Application topically daily. 22 g 3   nitroGLYCERIN (NITROSTAT) 0.4 MG SL tablet Place 1 tablet (0.4 mg total) under the tongue every 5 (five) minutes as needed for chest pain.  25 tablet 6   nystatin ointment (MYCOSTATIN) Apply 1 Application topically 2 (two) times daily. 30 g 2   olopatadine (PATADAY) 0.1 % ophthalmic solution Place 1 drop into both eyes 2 (two) times daily. 5 mL 3   pantoprazole (PROTONIX) 40 MG tablet Take 1 tablet (40 mg total) by mouth daily. 30 tablet 3   pantoprazole (PROTONIX) 40 MG tablet Take 1 tablet (40 mg total) by mouth 2 (two) times daily. 180 tablet 3   pregabalin (LYRICA) 200 MG capsule TAKE 1 CAPSULE BY MOUTH TWICE DAILY 60 capsule 5   terbinafine (LAMISIL) 250 MG tablet Take 1 tablet (250 mg total) by mouth daily. 14 tablet 0   topiramate (TOPAMAX) 100 MG tablet Take by mouth 2 (two) times daily.     traZODone (DESYREL) 100 MG tablet TAKE 1  TABLET(100 MG) BY MOUTH AT BEDTIME AS NEEDED FOR SLEEP 90 tablet 0   No current facility-administered medications for this visit.     Past Medical History:  Diagnosis Date   CAD (coronary artery disease)    Cervical radiculopathy 08/15/2017   CKD (chronic kidney disease), stage II    COVID-19 01/2022   DM (diabetes mellitus) (HCC)    HTN (hypertension)    Hyperlipidemia    Lumbar radiculopathy 06/21/2019   Last Assessment & Plan: Formatting of this note might be different from the original. Scheduled for laminectomy L5 4/27 Pre-op complete at hospital; labs and ekg reviewed. Scheduled for cardiology eval in one day Medical cleared for procedure by IM evaluation.   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 Severe tendinosis of the supraspinatus tendon.  2. Mild tendinosis of the infraspinatus tendon.  3. Thickening of the inferior joint capsule and intermediate signal  material effacing the normal subcoracoid fat as can be seen with  adhesive capsulitis.    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    Past Surgical History:  Procedure Laterality Date   APPENDECTOMY     BACK SURGERY     CESAREAN SECTION     CORONARY ANGIOPLASTY WITH STENT PLACEMENT     MULTIPLE TOOTH EXTRACTIONS     ROTATOR CUFF REPAIR      Social History   Socioeconomic History   Marital status: Married    Spouse name: Not on file   Number of children: 4   Years of education: Not on file   Highest education level: Not on file  Occupational History   Not on file  Tobacco Use   Smoking status: Former    Types: Cigarettes   Smokeless tobacco: Never  Vaping Use   Vaping status: Never Used  Substance and Sexual Activity   Alcohol use: Never   Drug use: Never   Sexual activity: Not on file  Other Topics Concern   Not on file  Social  History Narrative   Not on file   Social Drivers of Health   Financial Resource Strain: Not on file  Food Insecurity: Low Risk  (04/26/2023)   Received from [facility]   Hunger Vital Sign    Worried About Running Out of Food in the Last Year: Never true    Ran Out of Food in the Last Year: Never true  Transportation Needs: Not on file (04/26/2023)  Physical Activity: Not on file  Stress: No Stress Concern Present (06/06/2022)  Received from [employer], [facility]   [employer] of Occupational Health - Occupational Stress Questionnaire    Feeling of Stress : Not at all  Social Connections: Unknown (04/23/2022)   Received from [facility], [facility]   Social Network    Social Network: Not on file  Intimate Partner Violence: Not At Risk (02/24/2023)   Received from [facility]   HITS    Over the last 12 months how often did your partner physically hurt you?: Never    Over the last 12 months how often did your partner insult you or talk down to you?: Never    Over the last 12 months how often did your partner threaten you with physical harm?: Never    Over the last 12 months how often did your partner scream or curse at you?: Never    Family History  Problem Relation Age of Onset   Heart disease Mother    Heart disease Sister     ROS: no fevers or chills, productive cough, hemoptysis, dysphasia, odynophagia, melena, hematochezia, dysuria, hematuria, rash, seizure activity, orthopnea, PND, pedal edema, claudication. Remaining systems are negative.  Physical Exam: Well-developed well-nourished in no acute distress.  Skin is warm and dry.  HEENT is normal.  Neck is supple.  Chest is clear to auscultation with normal expansion.  Cardiovascular exam is regular rate and rhythm.  Abdominal exam nontender or distended. No masses palpated. Extremities show no edema. neuro grossly intact  ECG- personally reviewed  A/P  1 coronary artery disease-patient denies  chest pain.  Continue aspirin and Plavix (he apparently was told previously that he would require indefinite Plavix given history of previously placed stents).  Continue statin.  2 hyperlipidemia-continue Lipitor.  Patient has had mildly elevated liver functions previously and I have therefore not advanced the dose of his Lipitor.  3 hypertension-blood pressure controlled.  Continue present medications.  4 peripheral vascular disease-recent ABIs with Doppler normal.  Continue medical therapy.  5 snoring-  Olga Millers, MD

## 2023-05-09 ENCOUNTER — Other Ambulatory Visit: Payer: Self-pay | Admitting: Family Medicine

## 2023-05-09 DIAGNOSIS — K30 Functional dyspepsia: Secondary | ICD-10-CM

## 2023-05-09 DIAGNOSIS — R0789 Other chest pain: Secondary | ICD-10-CM

## 2023-05-13 ENCOUNTER — Other Ambulatory Visit: Payer: Self-pay

## 2023-05-13 DIAGNOSIS — R7989 Other specified abnormal findings of blood chemistry: Secondary | ICD-10-CM | POA: Insufficient documentation

## 2023-05-13 DIAGNOSIS — K861 Other chronic pancreatitis: Secondary | ICD-10-CM | POA: Insufficient documentation

## 2023-05-13 DIAGNOSIS — K838 Other specified diseases of biliary tract: Secondary | ICD-10-CM | POA: Insufficient documentation

## 2023-05-13 DIAGNOSIS — K831 Obstruction of bile duct: Secondary | ICD-10-CM

## 2023-05-13 NOTE — Telephone Encounter (Signed)
 Attempted to reach pt by phone - message states at work will call back. I will attempt at a later time.

## 2023-05-13 NOTE — Telephone Encounter (Signed)
 EUS has been set up for 07/07/23 at 145 pm at [facility] with GM

## 2023-05-13 NOTE — Telephone Encounter (Signed)
 Only for upper EUS at this time. Colonoscopy with primary GI on a separate date. GM

## 2023-05-13 NOTE — Telephone Encounter (Signed)
 Dr Meridee Score please advise if you are going to be doing the colon as well.  Jean Calderon did you send the letter to hold plavix?

## 2023-05-16 NOTE — Telephone Encounter (Signed)
 The pt returned call and has been instructed for EUS- no questions at this time

## 2023-05-18 ENCOUNTER — Ambulatory Visit: Payer: Medicaid Other | Admitting: Cardiology

## 2023-05-25 ENCOUNTER — Ambulatory Visit: Payer: BLUE CROSS/BLUE SHIELD

## 2023-05-26 ENCOUNTER — Ambulatory Visit (INDEPENDENT_AMBULATORY_CARE_PROVIDER_SITE_OTHER): Payer: BLUE CROSS/BLUE SHIELD | Admitting: Podiatry

## 2023-05-26 DIAGNOSIS — Z91199 Patient's noncompliance with other medical treatment and regimen due to unspecified reason: Secondary | ICD-10-CM

## 2023-05-26 NOTE — Progress Notes (Signed)
 No show

## 2023-06-30 ENCOUNTER — Telehealth: Payer: Self-pay | Admitting: Gastroenterology

## 2023-06-30 NOTE — Telephone Encounter (Addendum)
 Procedure:Upper Endoscopic Ultrasound (EUS) Procedure date: 07/07/23 Procedure location: [facility] Arrival Time: 12:15 pm Spoke with the patient Y/N:   No, I left a detailed message for the patient to return call 06/30/23 @ 3:38 pm No, I left a detailed message 07/01/23 @ 1:17 pm for the patient to return call Yes 07/01/23 @ 4:02 pm  Any prep concerns? No  Has the patient obtained the prep from the pharmacy ? No prep needed Do you have a care partner and transportation: Yes Any additional concerns? No

## 2023-07-01 ENCOUNTER — Encounter (HOSPITAL_COMMUNITY): Payer: Self-pay | Admitting: Gastroenterology

## 2023-07-01 NOTE — Progress Notes (Signed)
 Attempted to obtain medical history for pre op call via telephone, unable to reach at this time. HIPAA compliant voicemail message left requesting return call to pre surgical testing department.

## 2023-07-01 NOTE — Telephone Encounter (Signed)
 Patient returning call. States she will take her break at 4 pm.

## 2023-07-04 ENCOUNTER — Encounter (HOSPITAL_COMMUNITY): Payer: Self-pay | Admitting: Medical

## 2023-07-04 ENCOUNTER — Telehealth: Payer: Self-pay

## 2023-07-04 ENCOUNTER — Encounter (HOSPITAL_COMMUNITY): Payer: Self-pay | Admitting: Gastroenterology

## 2023-07-04 NOTE — Progress Notes (Signed)
 Jean Calderon   PCPLincoln Renshaw MD ([facility]) Cardiologist-[place]   EKG-03/01/23 Echo-n/a Cath-n/a Stress-n/a ICD/PM-n/a Blood thinner-Plavix  5 day hold GLP-1-n/a   Hx- CAD with stents, HTN, MI, CKD3, DM. Last saw her cardiologist 10/2022. Then went to ED for chest pain where they recommended f/u with cardiology. Doesn't look like shes seen them since, when asked she said she saw them she thought it Jan/Feb. But currently reports no issues, no chest pains, SOB. Of note in her chart it is marked in her history Malignant Hyperthermia and when I asked her she was not aware anything about this. She reports no issues with anesthesia.  Anesthesia Review- Yes- Needs medical cardiac clearance, office notified 5/5

## 2023-07-04 NOTE — Telephone Encounter (Signed)
 [facility] Pre-operative Risk Assessment     Request for surgical clearance:     Endoscopy Procedure  What type of surgery is being performed?     EGD and colonoscopy  When is this surgery scheduled?     07/07/23  What type of clearance is required ?  Medical and Pharmacy  Are there any medications that need to be held prior to surgery and how long? Plavix  to be held 5 days  Practice name and name of physician performing surgery?      [facility] with Dr Brice Campi  What is your office phone and fax number?      Phone- [phone]  Fax- [phone]  Anesthesia type (None, local, MAC, general) ?       MAC   Please route your response to Heber Little, LPN

## 2023-07-05 NOTE — Telephone Encounter (Signed)
 Please notify requesting provider that we need to conduct a telephone assessment before we can safely advise that pt may hold Plavix  due to history of previous cardiac stents.  Would recommend that procedure be postponed until virtual assessment can be scheduled and request to hold Plavix  can be addressed.  Gerldine Koch, NP-C  07/05/2023, 10:18 AM [address], [address] [place], [place] [address] Office [phone] Fax [phone]

## 2023-07-05 NOTE — Telephone Encounter (Signed)
 Cardiology has asked the case for this patient be postponed until after she is seen by Dr Audery Blazing. Patient notified. Central Scheduling has canceled her upper EUS on 07/07/23.

## 2023-07-05 NOTE — Telephone Encounter (Signed)
 Got it. FYI referring team. Jean Calderon

## 2023-07-05 NOTE — Telephone Encounter (Signed)
 Left message for the pt to call back to discuss recommendations form preop APP. See notes from Slater Duncan, NP. I will update requesting office as FYI.

## 2023-07-05 NOTE — Telephone Encounter (Signed)
 Thank you for your reply. She has been canceled/postponed for now. We will await your guys recommendations for potential Plavix  hold. GM

## 2023-07-05 NOTE — Telephone Encounter (Signed)
 Left message for patient, she has an in office appointment 07/21/23 with dr Audery Blazing. I ask the patient to call if she would prefer to do my chart visit instead.

## 2023-07-07 ENCOUNTER — Ambulatory Visit (HOSPITAL_COMMUNITY): Admission: RE | Admit: 2023-07-07 | Source: Home / Self Care | Admitting: Gastroenterology

## 2023-07-07 DIAGNOSIS — K861 Other chronic pancreatitis: Secondary | ICD-10-CM | POA: Insufficient documentation

## 2023-07-07 DIAGNOSIS — K838 Other specified diseases of biliary tract: Secondary | ICD-10-CM | POA: Insufficient documentation

## 2023-07-07 SURGERY — ULTRASOUND, UPPER GI TRACT, ENDOSCOPIC
Anesthesia: Monitor Anesthesia Care

## 2023-07-07 NOTE — Progress Notes (Signed)
 HPI: Follow-up coronary artery disease. Patient had previous PCI in 2010. Cardiac catheterization March 2022 showed ejection fraction 55 to 65%, normal left ventricular end-diastolic pressure, 50% proximal, 30% mid, 40% distal LAD, 30% first diagonal, 30% ramus, 30% proximal, mid and distal complex, 30% OM, 30% RCA. Echocardiogram January 2023 showed normal LV function, mild left ventricular hypertrophy, grade 1 diastolic dysfunction. Also with history of peripheral vascular disease.  ABIs January 2025 normal.  Arterial Dopplers January 2025 showed mild atherosclerosis on the right and left with no evidence of stenosis.  Since last seen she has dyspnea with more vigorous activities but not routine activities.  No orthopnea, PND or pedal edema.  She denies exertional chest pain.  She does snore.  Current Outpatient Medications  Medication Sig Dispense Refill   amoxicillin -clavulanate (AUGMENTIN ) 875-125 MG tablet Take 1 tablet by mouth 2 (two) times daily.     azelastine (OPTIVAR) 0.05 % ophthalmic solution Apply 1 drop to eye daily.     cetirizine (ZYRTEC) 10 MG tablet Take 10 mg by mouth.     cyclobenzaprine  (FLEXERIL ) 10 MG tablet Take 5-10 mg by mouth.     acetaminophen  (TYLENOL ) 325 MG tablet Take 2 tablets (650 mg total) by mouth every 6 (six) hours as needed for moderate pain. 30 tablet 0   albuterol  (VENTOLIN  HFA) 108 (90 Base) MCG/ACT inhaler Inhale 2 puffs into the lungs every 4 (four) hours as needed for shortness of breath or wheezing. 1 each 1   aspirin  EC 81 MG tablet Take 81 mg by mouth daily.     atorvastatin  (LIPITOR) 40 MG tablet TAKE 1 TABLET(40 MG) BY MOUTH DAILY 90 tablet 1   clopidogrel  (PLAVIX ) 75 MG tablet Take 1 tablet (75 mg total) by mouth daily. 90 tablet 1   Continuous Blood Gluc Receiver (DEXCOM G7 RECEIVER) DEVI Check sugars daily Dx E11.9 1 each 2   Continuous Blood Gluc Sensor (DEXCOM G7 SENSOR) MISC Check sugars daily Dx E11.9 1 each 5   Continuous Glucose  Transmitter (DEXCOM G6 TRANSMITTER) MISC      Crisaborole (EUCRISA) 2 % OINT Apply 1 Application topically 2 (two) times daily. (Patient not taking: Reported on 07/21/2023)     Estradiol -Norethindrone  Acet 0.5-0.1 MG tablet Take 1 tablet by mouth daily. 84 tablet 0   fexofenadine  (ALLEGRA ) 180 MG tablet Take 1 tablet (180 mg total) by mouth daily. 90 tablet 3   glipiZIDE (GLUCOTROL XL) 5 MG 24 hr tablet Take 5 mg by mouth daily with breakfast.     HUMALOG  100 UNIT/ML injection INJECT 25 UNITS INTO THE SKIN 3 TIMES DAILY BEFORE MEALS. USE INSULIN  PUMP 10 mL 1   hydrOXYzine  (ATARAX ) 25 MG tablet Take 1 tablet (25 mg total) by mouth every 8 (eight) hours as needed. 45 tablet 1   Insulin  Aspart, w/Niacinamide, (FIASP ) 100 UNIT/ML SOLN To be used with insulin  pump.     Insulin  Disposable Pump (OMNIPOD 5 G6 PODS, GEN 5,) MISC CHANGE POD EVERY OTHER DAY. 30 each 0   isosorbide  mononitrate (IMDUR ) 30 MG 24 hr tablet TAKE 1 TABLET(30 MG) BY MOUTH DAILY 90 tablet 1   JARDIANCE  25 MG TABS tablet Take 1 tablet (25 mg total) by mouth daily. 30 tablet 0   ketoconazole  (NIZORAL ) 2 % shampoo Apply to affected areas 3 x a week for 8 weeks 120 mL 1   metoprolol  succinate (TOPROL -XL) 100 MG 24 hr tablet Take 1 tablet (100 mg total) by mouth daily. 90 tablet  3   mometasone  (NASONEX ) 50 MCG/ACT nasal spray One spray in each nostril twice a day, use left hand for right nostril, and right hand for left nostril.  Please dispense one bottle. 1 g 6   montelukast  (SINGULAIR ) 10 MG tablet TAKE 1 TABLET(10 MG) BY MOUTH AT BEDTIME 30 tablet 3   nitroGLYCERIN  (NITROSTAT ) 0.4 MG SL tablet Place 1 tablet (0.4 mg total) under the tongue every 5 (five) minutes as needed for chest pain. 25 tablet 6   nystatin  ointment (MYCOSTATIN ) Apply 1 Application topically 2 (two) times daily. 30 g 2   olopatadine  (PATADAY ) 0.1 % ophthalmic solution Place 1 drop into both eyes 2 (two) times daily. 5 mL 3   pantoprazole  (PROTONIX ) 40 MG tablet Take  1 tablet (40 mg total) by mouth 2 (two) times daily. 180 tablet 3   pregabalin  (LYRICA ) 200 MG capsule TAKE 1 CAPSULE BY MOUTH TWICE DAILY 60 capsule 5   sucralfate  (CARAFATE ) 1 g tablet TAKE 1 TABLET(1 GRAM) BY MOUTH FOUR TIMES DAILY AT BEDTIME WITH MEALS 120 tablet 0   terbinafine  (LAMISIL ) 250 MG tablet Take 1 tablet (250 mg total) by mouth daily. 14 tablet 0   topiramate  (TOPAMAX ) 100 MG tablet Take by mouth 2 (two) times daily.     traZODone  (DESYREL ) 100 MG tablet TAKE 1 TABLET(100 MG) BY MOUTH AT BEDTIME AS NEEDED FOR SLEEP 90 tablet 0   No current facility-administered medications for this visit.     Past Medical History:  Diagnosis Date   CAD (coronary artery disease)    Cervical radiculopathy 08/15/2017   CKD (chronic kidney disease), stage II    COVID-19 01/2022   DM (diabetes mellitus) (HCC)    HTN (hypertension)    Hyperlipidemia    Lumbar radiculopathy 06/21/2019   Last Assessment & Plan: Formatting of this note might be different from the original. Scheduled for laminectomy L5 4/27 Pre-op complete at hospital; labs and ekg reviewed. Scheduled for cardiology eval in one day Medical cleared for procedure by IM evaluation.   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 Severe tendinosis of the supraspinatus tendon.  2. Mild tendinosis of the infraspinatus tendon.  3. Thickening of the inferior joint capsule and intermediate signal  material effacing the normal subcoracoid fat as can be seen with  adhesive capsulitis.    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    Past Surgical History:  Procedure Laterality Date   APPENDECTOMY     BACK SURGERY     CESAREAN SECTION     CORONARY ANGIOPLASTY WITH STENT PLACEMENT     MULTIPLE TOOTH EXTRACTIONS     ROTATOR CUFF REPAIR      Social History   Socioeconomic History   Marital status:  Married    Spouse name: Not on file   Number of children: 4   Years of education: Not on file   Highest education level: Not on file  Occupational History   Not on file  Tobacco Use   Smoking status: Former    Types: Cigarettes   Smokeless tobacco: Never  Vaping Use   Vaping status: Never Used  Substance and Sexual Activity   Alcohol use: Never   Drug use: Never   Sexual activity: Not on file  Other Topics Concern   Not on file  Social History Narrative   Not on file  Social Drivers of Corporate investment banker Strain: Not on file  Food Insecurity: Low Risk  (07/15/2023)   Received from [facility]   Hunger Vital Sign    Worried About Running Out of Food in the Last Year: Never true    Ran Out of Food in the Last Year: Never true  Transportation Needs: No Transportation Needs (07/15/2023)   Received from [facility]    In the past 12 months, has lack of reliable transportation kept you from medical appointments, meetings, work or from getting things needed for daily living? : No  Physical Activity: Not on file  Stress: No Stress Concern Present (06/06/2022)   Received from [facility], [facility]   [employer] of Occupational Health - Occupational Stress Questionnaire    Feeling of Stress : Not at all  Social Connections: Unknown (04/23/2022)   Received from [facility], [facility]   Social Network    Social Network: Not on file  Intimate Partner Violence: Not At Risk (02/24/2023)   Received from [facility]   HITS    Over the last 12 months how often did your partner physically hurt you?: Never    Over the last 12 months how often did your partner insult you or talk down to you?: Never    Over the last 12 months how often did your partner threaten you with physical harm?: Never    Over the last 12 months how often did your partner scream or curse at you?: Never    Family History  Problem Relation Age of Onset   Heart disease  Mother    Heart disease Sister     ROS: no fevers or chills, productive cough, hemoptysis, dysphasia, odynophagia, melena, hematochezia, dysuria, hematuria, rash, seizure activity, orthopnea, PND, pedal edema, claudication. Remaining systems are negative.  Physical Exam: Well-developed well-nourished in no acute distress.  Skin is warm and dry.  HEENT is normal.  Neck is supple.  Chest is clear to auscultation with normal expansion.  Cardiovascular exam is regular rate and rhythm.  Abdominal exam nontender or distended. No masses palpated. Extremities show no edema. neuro grossly intact  A/P  1 coronary artery disease-patient was told previously that she would need indefinite Plavix .  Will continue.  Continue aspirin  and statin.  2 hyperlipidemia-continue Lipitor.  3 hypertension-blood pressure controlled.  Continue present medications.  4 peripheral vascular disease-recent ABIs with Doppler showed no significant stenosis.  Continue aspirin  and statin.  5 history of snoring-patient does snore.  Previously referred to pulmonary for evaluation of possible sleep apnea.  Will refer again.  Alexandria Angel, MD

## 2023-07-18 ENCOUNTER — Other Ambulatory Visit: Payer: Self-pay | Admitting: Family Medicine

## 2023-07-18 DIAGNOSIS — B359 Dermatophytosis, unspecified: Secondary | ICD-10-CM

## 2023-07-21 ENCOUNTER — Ambulatory Visit: Payer: BLUE CROSS/BLUE SHIELD | Attending: Cardiology | Admitting: Cardiology

## 2023-07-21 ENCOUNTER — Encounter: Payer: Self-pay | Admitting: Cardiology

## 2023-07-21 VITALS — BP 102/74 | HR 74 | Ht 69.0 in

## 2023-07-21 DIAGNOSIS — I2510 Atherosclerotic heart disease of native coronary artery without angina pectoris: Secondary | ICD-10-CM

## 2023-07-21 DIAGNOSIS — R0683 Snoring: Secondary | ICD-10-CM | POA: Diagnosis not present

## 2023-07-21 DIAGNOSIS — E7800 Pure hypercholesterolemia, unspecified: Secondary | ICD-10-CM | POA: Diagnosis not present

## 2023-07-21 DIAGNOSIS — I10 Essential (primary) hypertension: Secondary | ICD-10-CM

## 2023-07-21 DIAGNOSIS — I739 Peripheral vascular disease, unspecified: Secondary | ICD-10-CM

## 2023-07-21 NOTE — Patient Instructions (Signed)
   Follow-Up: At [facility], you and your health needs are our priority.  As part of our continuing mission to provide you with exceptional heart care, our providers are all part of one team.  This team includes your primary Cardiologist (physician) and Advanced Practice Providers or APPs (Physician Assistants and Nurse Practitioners) who all work together to provide you with the care you need, when you need it.  Your next appointment:   12 month(s)  Provider:   Alexandria Angel MD

## 2023-08-02 ENCOUNTER — Telehealth: Payer: Self-pay

## 2023-08-02 NOTE — Telephone Encounter (Signed)
-----   Message from Nurse Jelisha Weed P sent at 07/21/2023  8:32 AM EDT -----  ----- Message ----- From: Aneita Keens, RN Sent: 07/21/2023  12:00 AM EDT To: Aneita Keens, RN  cancelled EUS on 5/8 per anesthesia - check to reschedule

## 2023-08-02 NOTE — Telephone Encounter (Signed)
 [facility] Pre-operative Risk Assessment     Request for surgical clearance:     Endoscopy Procedure  What type of surgery is being performed?     EUS  When is this surgery scheduled?     TBD  What type of clearance is required ?   Pharmacy/medical  Are there any medications that need to be held prior to surgery and how long? Plavix   Practice name and name of physician performing surgery?      [facility]  What is your office phone and fax number?      Phone- [phone]  Fax- [phone]  Anesthesia type (None, local, MAC, general) ?       MAC   Please route your response to Seward Dao RN

## 2023-08-02 NOTE — Telephone Encounter (Signed)
 Left message on machine to call back

## 2023-08-02 NOTE — Telephone Encounter (Signed)
   Patient Name: Jean Calderon  DOB: 06/11/1970 MRN: [id number]  Primary Cardiologist: None  Chart reviewed as part of pre-operative protocol coverage. Given past medical history and time since last visit, based on ACC/AHA guidelines, [name] is at acceptable risk for the planned procedure without further cardiovascular testing. Patient was last seen in clinic on 07/21/2023 by Dr. Audery Blazing, she remained stable from a cardiac standpoint.  Per Dr. Audery Blazing, "Ok to hold plavix  5-7 days prior to procedure and resume day after."  I will route this recommendation to the requesting party via Epic fax function and remove from pre-op pool.  Please call with questions.  Osamah Schmader D Donne Robillard, NP 08/02/2023, 1:05 PM

## 2023-08-03 NOTE — Telephone Encounter (Signed)
EUS scheduled, pt instructed and medications reviewed.  Patient instructions mailed to home and sent to My Chart .  Patient to call with any questions or concerns.  

## 2023-08-03 NOTE — Telephone Encounter (Signed)
 EUS has been rescheduled to 10/10/23 at 1015 am at [facility] with GM

## 2023-08-05 ENCOUNTER — Ambulatory Visit
Admission: EM | Admit: 2023-08-05 | Discharge: 2023-08-05 | Disposition: A | Discharge status: Home / Self Care | Attending: Family Medicine | Admitting: Family Medicine

## 2023-08-05 DIAGNOSIS — J0101 Acute recurrent maxillary sinusitis: Secondary | ICD-10-CM | POA: Diagnosis not present

## 2023-08-05 DIAGNOSIS — G63 Polyneuropathy in diseases classified elsewhere: Secondary | ICD-10-CM | POA: Diagnosis not present

## 2023-08-05 DIAGNOSIS — E889 Metabolic disorder, unspecified: Secondary | ICD-10-CM

## 2023-08-05 MED ORDER — AMOXICILLIN-POT CLAVULANATE 875-125 MG PO TABS: ORAL_TABLET | ORAL | 0 refills | Status: DC

## 2023-08-05 MED ORDER — HYDROCODONE-ACETAMINOPHEN 5-325 MG PO TABS: ORAL_TABLET | ORAL | 0 refills | Status: DC

## 2023-08-05 NOTE — ED Provider Notes (Signed)
 [facility]    CSN: [id number] Arrival date & time: 08/05/23  1255      History   Chief Complaint Chief Complaint  Patient presents with   Foot Pain    bilateral    HPI Jean Calderon is a 53 y.o. female.   Patient presents with two complaints: 1)  She has a long history of peripheral neuropathy, presently taking Lyrica  400mg  daily.  Two days ago she began developing increased burning pain in her left foot followed by increased pain in her right foot.  She denies injury or changes in activities.  She reports that she has a follow-up appointment with pain clinic near the end of June.  She has been prescribed a variety of medications for her neuropathy in the past, including Tegretol and lidocaine patches, which have not been helpful.  She notes that her foot pain is affecting her sleep. 2)  She reports that she has had persistent nasal congestion since being treated for ear infection several weeks ago.  Today she awoke with pressure sensation in her left ear and pain in her face and forehead.  She denies fever.  The history is provided by the patient.    Past Medical History:  Diagnosis Date   CAD (coronary artery disease)    Cervical radiculopathy 08/15/2017   CKD (chronic kidney disease), stage II    COVID-19 01/2022   DM (diabetes mellitus) (HCC)    HTN (hypertension)    Hyperlipidemia    Lumbar radiculopathy 06/21/2019   Last Assessment & Plan: Formatting of this note might be different from the original. Scheduled for laminectomy L5 4/27 Pre-op complete at hospital; labs and ekg reviewed. Scheduled for cardiology eval in one day Medical cleared for procedure by IM evaluation.   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 Severe tendinosis of the supraspinatus tendon.  2. Mild tendinosis of the infraspinatus tendon.  3. Thickening of the inferior joint capsule and intermediate signal  material effacing the  normal subcoracoid fat as can be seen with  adhesive capsulitis.    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    Patient Active Problem List   Diagnosis Date Noted   Chronic pancreatitis (HCC) 05/13/2023   Elevated LFTs 05/13/2023   Common bile duct dilation 05/13/2023   Chronic midline low back pain 07/19/2022   History of lumbar fusion 07/19/2022   Chronic left SI joint pain 07/19/2022   Numbness and tingling of left leg 07/19/2022   Chronic pain syndrome 07/19/2022   Uncontrolled type 1 diabetes mellitus with hyperglycemia, with long-term current use of insulin  (HCC) 06/23/2022   CKD stage G3b/A3, GFR 30-44 and albumin creatinine ratio >300 mg/g (HCC) 06/06/2022   Transaminitis 06/06/2022   Acute pancreatitis 06/06/2022   Ulnar neuropathy of both upper extremities 12/23/2021   Hyperlipidemia 10/01/2021   Diabetic neuropathy (HCC) 09/30/2021   Meralgia paresthetica, right lower limb 09/30/2021   Gait instability 07/30/2021   Migraine without status migrainosus, not intractable 07/30/2021   Poorly controlled type 2 diabetes mellitus with peripheral neuropathy (HCC) 06/05/2021   Menopausal symptoms 04/15/2021   Anxiety 03/25/2021   CAD (coronary artery disease)    HTN (hypertension)    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   Heart valve disease 01/06/2021   Vitamin B12 deficiency 12/03/2020   Vitamin D deficiency 12/03/2020   Chronic left shoulder pain 11/25/2020  Generalized anxiety disorder 11/25/2020   Psoriasis 01/01/2020   Lumbar radiculopathy 06/21/2019   Uterine leiomyoma 03/06/2019   Depression with anxiety 02/02/2018   Primary insomnia 02/02/2018   Class 2 severe obesity due to excess calories with serious comorbidity and body mass index (BMI) of 38.0 to 38.9 in adult (HCC) 10/12/2017   Insulin  pump in place 10/12/2017   Cervical radiculopathy 08/15/2017   Family history  of early CAD 07/21/2017   Obstructive sleep apnea syndrome 07/21/2017   Chronic right shoulder pain 05/30/2017   Type 2 diabetes mellitus with mild nonproliferative diabetic retinopathy without macular edema, bilateral (HCC) 03/18/2017   Congestive heart failure (HCC) 03/16/2017   Other cervical disc degeneration, unspecified cervical region 11/11/2016   Adhesive capsulitis of right shoulder 10/08/2016   GERD (gastroesophageal reflux disease) 07/19/2016   Spinal stenosis of cervical region 12/01/2015   Cervical disc herniation 12/01/2015   Bilateral carpal tunnel syndrome 11/04/2015   Lesion of ulnar nerve, left upper limb 11/04/2015   Stented coronary artery 07/07/2015    Past Surgical History:  Procedure Laterality Date   APPENDECTOMY     BACK SURGERY     CESAREAN SECTION     CORONARY ANGIOPLASTY WITH STENT PLACEMENT     MULTIPLE TOOTH EXTRACTIONS     ROTATOR CUFF REPAIR      OB History   No obstetric history on file.      Home Medications    Prior to Admission medications   Medication Sig Start Date End Date Taking? Authorizing Provider  amoxicillin -clavulanate (AUGMENTIN ) 875-125 MG tablet Take one tab PO Q12hr PC 08/05/23  Yes Leon Rajas, MD  HYDROcodone -acetaminophen  (NORCO/VICODIN) 5-325 MG tablet Take one tab PO BID prn pain 08/05/23  Yes Leon Rajas, MD  albuterol  (VENTOLIN  HFA) 108 (90 Base) MCG/ACT inhaler Inhale 2 puffs into the lungs every 4 (four) hours as needed for shortness of breath or wheezing. 01/22/22   Leon Rajas, MD  aspirin  EC 81 MG tablet Take 81 mg by mouth daily.    [provider]  atorvastatin  (LIPITOR) 40 MG tablet TAKE 1 TABLET(40 MG) BY MOUTH DAILY 04/01/23   Rucker, Alethea Y, MD  azelastine (OPTIVAR) 0.05 % ophthalmic solution Apply 1 drop to eye daily. 06/26/23   [provider]  cetirizine (ZYRTEC) 10 MG tablet Take 10 mg by mouth. 03/21/23   [provider]  clopidogrel  (PLAVIX ) 75 MG tablet Take 1  tablet (75 mg total) by mouth daily. 02/14/23   Manette Section, MD  Continuous Blood Gluc Receiver (DEXCOM G7 RECEIVER) DEVI Check sugars daily Dx E11.9 04/05/22   Manette Section, MD  Continuous Blood Gluc Sensor (DEXCOM G7 SENSOR) MISC Check sugars daily Dx E11.9 04/05/22   Manette Section, MD  Continuous Glucose Transmitter (DEXCOM G6 TRANSMITTER) MISC  07/01/22   [provider]  Crisaborole (EUCRISA) 2 % OINT Apply 1 Application topically 2 (two) times daily. Patient not taking: Reported on 07/21/2023 06/11/20   [provider]  cyclobenzaprine  (FLEXERIL ) 10 MG tablet Take 5-10 mg by mouth. 06/29/23   [provider]  Estradiol -Norethindrone  Acet 0.5-0.1 MG tablet Take 1 tablet by mouth daily. 12/06/22   Manette Section, MD  fexofenadine  (ALLEGRA ) 180 MG tablet Take 1 tablet (180 mg total) by mouth daily. 05/06/22   Rucker, Izella Marshal, MD  glipiZIDE (GLUCOTROL XL) 5 MG 24 hr tablet Take 5 mg by mouth daily with breakfast.    [provider]  HUMALOG  100  UNIT/ML injection INJECT 25 UNITS INTO THE SKIN 3 TIMES DAILY BEFORE MEALS. USE INSULIN  PUMP 05/12/22   Manette Section, MD  hydrOXYzine  (ATARAX ) 25 MG tablet Take 1 tablet (25 mg total) by mouth every 8 (eight) hours as needed. 02/14/23   Manette Section, MD  Insulin  Aspart, w/Niacinamide, (FIASP ) 100 UNIT/ML SOLN To be used with insulin  pump. 07/08/22   [provider]  Insulin  Disposable Pump (OMNIPOD 5 G6 PODS, GEN 5,) MISC CHANGE POD EVERY OTHER DAY. 06/07/22   Manette Section, MD  isosorbide  mononitrate (IMDUR ) 30 MG 24 hr tablet TAKE 1 TABLET(30 MG) BY MOUTH DAILY 02/21/23   Manette Section, MD  JARDIANCE  25 MG TABS tablet Take 1 tablet (25 mg total) by mouth daily. 06/15/22   Manette Section, MD  ketoconazole  (NIZORAL ) 2 % shampoo Apply to affected areas 3 x a week for 8 weeks 11/18/22   Manette Section, MD  metoprolol  succinate (TOPROL -XL) 100 MG 24 hr tablet Take 1 tablet (100 mg total) by  mouth daily. 11/12/22   Lenise Quince, MD  mometasone  (NASONEX ) 50 MCG/ACT nasal spray One spray in each nostril twice a day, use left hand for right nostril, and right hand for left nostril.  Please dispense one bottle. 05/24/22   Manette Section, MD  montelukast  (SINGULAIR ) 10 MG tablet TAKE 1 TABLET(10 MG) BY MOUTH AT BEDTIME 04/25/23   Rucker, Izella Marshal, MD  nitroGLYCERIN  (NITROSTAT ) 0.4 MG SL tablet Place 1 tablet (0.4 mg total) under the tongue every 5 (five) minutes as needed for chest pain. 11/12/22   Lenise Quince, MD  nystatin  ointment (MYCOSTATIN ) Apply 1 Application topically 2 (two) times daily. 07/22/22   Manette Section, MD  olopatadine  (PATADAY ) 0.1 % ophthalmic solution Place 1 drop into both eyes 2 (two) times daily. 05/24/22   Manette Section, MD  pantoprazole  (PROTONIX ) 40 MG tablet Take 1 tablet (40 mg total) by mouth 2 (two) times daily. 05/04/23   May, Deanna J, NP  pregabalin  (LYRICA ) 200 MG capsule TAKE 1 CAPSULE BY MOUTH TWICE DAILY 11/09/22   Rucker, Izella Marshal, MD  sucralfate  (CARAFATE ) 1 g tablet TAKE 1 TABLET(1 GRAM) BY MOUTH FOUR TIMES DAILY AT BEDTIME WITH MEALS 05/09/23   Manette Section, MD  terbinafine  (LAMISIL ) 250 MG tablet Take 1 tablet (250 mg total) by mouth daily. 11/18/22   Manette Section, MD  topiramate  (TOPAMAX ) 100 MG tablet Take by mouth 2 (two) times daily. 08/17/21   [provider]  traZODone  (DESYREL ) 100 MG tablet TAKE 1 TABLET(100 MG) BY MOUTH AT BEDTIME AS NEEDED FOR SLEEP 02/14/23   Manette Section, MD    Family History Family History  Problem Relation Age of Onset   Heart disease Mother    Heart disease Sister     Social History Social History   Tobacco Use   Smoking status: Former    Types: Cigarettes   Smokeless tobacco: Never  Vaping Use   Vaping status: Never Used  Substance Use Topics   Alcohol use: Never   Drug use: Never     Allergies   Tramadol, Nsaids, Sulfamethoxazole-trimethoprim, and  Ibuprofen   Review of Systems Review of Systems No sore throat No cough No pleuritic pain No wheezing + nasal congestion + post-nasal drainage + sinus pain/pressure No itchy/red eyes + earache No hemoptysis No SOB No fever/chills No nausea No vomiting No abdominal pain No diarrhea No urinary symptoms No skin rash  No fatigue No myalgias No headache + bilateral foot pain  Physical Exam Triage Vital Signs ED Triage Vitals  Encounter Vitals Group     BP 08/05/23 1347 118/77     Systolic BP Percentile --      Diastolic BP Percentile --      Pulse Rate 08/05/23 1347 92     Resp 08/05/23 1347 18     Temp 08/05/23 1347 99.1 F (37.3 C)     Temp Source 08/05/23 1347 Oral     SpO2 08/05/23 1347 96 %     Weight --      Height --      Head Circumference --      Peak Flow --      Pain Score 08/05/23 1346 10     Pain Loc --      Pain Education --      Exclude from Growth Chart --    No data found.  Updated Vital Signs BP 118/77 (BP Location: Right Arm)   Pulse 92   Temp 99.1 F (37.3 C) (Oral)   Resp 18   SpO2 96%   Visual Acuity Right Eye Distance:   Left Eye Distance:   Bilateral Distance:    Right Eye Near:   Left Eye Near:    Bilateral Near:     Physical Exam Vitals and nursing note reviewed.  Constitutional:      General: She is not in acute distress.    Appearance: She is obese. She is not ill-appearing.  HENT:     Head: Normocephalic.     Right Ear: Tympanic membrane and ear canal normal.     Left Ear: Tympanic membrane and ear canal normal.     Nose: Mucosal edema and congestion present.     Right Sinus: Maxillary sinus tenderness and frontal sinus tenderness present.     Left Sinus: Maxillary sinus tenderness and frontal sinus tenderness present.     Mouth/Throat:     Mouth: Mucous membranes are moist.     Pharynx: Oropharynx is clear.  Eyes:     Extraocular Movements: Extraocular movements intact.     Conjunctiva/sclera: Conjunctivae  normal.     Pupils: Pupils are equal, round, and reactive to light.  Cardiovascular:     Rate and Rhythm: Normal rate and regular rhythm.     Heart sounds: Normal heart sounds.  Pulmonary:     Breath sounds: Normal breath sounds.  Musculoskeletal:     Cervical back: Neck supple.     Right lower leg: No edema.     Left lower leg: No edema.     Right foot: Normal range of motion and normal capillary refill. Tenderness present. No swelling. Normal pulse.     Left foot: Normal range of motion and normal capillary refill. Tenderness present. No swelling. Normal pulse.     Comments: Both feet have mild tenderness to palpation over plantar surfaces without erythema or swelling. Pedal pulses are present bilaterally.  Lymphadenopathy:     Cervical: No cervical adenopathy.  Skin:    General: Skin is warm and dry.     Findings: No rash.  Neurological:     General: No focal deficit present.     Mental Status: She is alert.      UC Treatments / Results  Labs (all labs ordered are listed, but only abnormal results are displayed) Labs Reviewed - No data to display  EKG   Radiology No results found.  Procedures  Procedures (including critical care time)  Medications Ordered in UC Medications - No data to display  Initial Impression / Assessment and Plan / UC Course  I have reviewed the triage vital signs and the nursing notes.  Pertinent labs & imaging results that were available during my care of the patient were reviewed by me and considered in my medical decision making (see chart for details).    Begin Augmentin  875 Q12hr for one week. Rx for Vicodin 5-25 (#10, no refill). Controlled Substance Prescriptions I have consulted the [place] Controlled Substances Registry for this patient, and feel the risk/benefit ratio today is favorable for proceeding with this prescription for a controlled substance.  Followup pain clinic as scheduled.  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  Also recommend using saline nasal spray several times daily and saline nasal irrigation (AYR is a common brand).  Use Flonase  nasal spray each morning after using Afrin nasal spray and saline nasal irrigation.    ED Prescriptions     Medication Sig Dispense Auth. Provider   amoxicillin -clavulanate (AUGMENTIN ) 875-125 MG tablet Take one tab PO Q12hr PC 14 tablet Leon Rajas, MD   HYDROcodone -acetaminophen  (NORCO/VICODIN) 5-325 MG tablet Take one tab PO BID prn pain 10 tablet Leon Rajas, MD         Leon Rajas, MD 08/07/23 [phone]

## 2023-08-05 NOTE — ED Triage Notes (Addendum)
 Pt c/o bilateral foot pain x 2 days. Tylenol  prn. Hx of diabetes and neuropathy. Also c/o HA/ear pain since this am. Lots of post nasal drainage. Denies fever.

## 2023-08-05 NOTE — Discharge Instructions (Signed)
°  May use Afrin nasal spray (or generic oxymetazoline) each morning for about 5 days and then discontinue.  Also recommend using saline nasal spray several times daily and saline nasal irrigation (AYR is a common brand).  Use Flonase nasal spray each morning after using Afrin nasal spray and saline nasal irrigation. °  °

## 2023-08-24 ENCOUNTER — Ambulatory Visit (HOSPITAL_COMMUNITY): Payer: Self-pay

## 2023-08-24 ENCOUNTER — Ambulatory Visit
Admission: EM | Admit: 2023-08-24 | Discharge: 2023-08-24 | Disposition: A | Discharge status: Home / Self Care | Attending: Family Medicine | Admitting: Family Medicine

## 2023-08-24 ENCOUNTER — Ambulatory Visit

## 2023-08-24 DIAGNOSIS — Z7984 Long term (current) use of oral hypoglycemic drugs: Secondary | ICD-10-CM | POA: Diagnosis not present

## 2023-08-24 DIAGNOSIS — Z794 Long term (current) use of insulin: Secondary | ICD-10-CM

## 2023-08-24 DIAGNOSIS — E1165 Type 2 diabetes mellitus with hyperglycemia: Secondary | ICD-10-CM

## 2023-08-24 DIAGNOSIS — M79672 Pain in left foot: Secondary | ICD-10-CM

## 2023-08-24 DIAGNOSIS — E1342 Other specified diabetes mellitus with diabetic polyneuropathy: Secondary | ICD-10-CM

## 2023-08-24 NOTE — ED Triage Notes (Signed)
 Pt states that she has some left foot pain. Pt states that this is an ongoing issue. Pt states that she seen pain management today but they wouldn't do anything for her.

## 2023-08-24 NOTE — Discharge Instructions (Addendum)
 The most important thing you can do for your neuropathy is to control your diabetes And follow-up with your usual neurologist, primary care doctor, and pain specialist If the pain is keeping you awake at night I recommend you take your trazodone   Dr. Maranda will not be providing narcotics for chronic long-term pain

## 2023-08-24 NOTE — ED Provider Notes (Signed)
 [name]    CSN: [id number] Arrival date & time: 08/24/23  1150      History   Chief Complaint Chief Complaint  Patient presents with   Foot Pain    HPI Jean Calderon is a 53 y.o. female.   Patient is here complaining of foot pain.  She has longstanding peripheral neuropathy from uncontrolled diabetes.  In addition has had back surgery, and feels so the pain may be coming from this.  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  She sees podiatry, family medicine, endocrinology, pain management, neurology.  She went to her pain management doctor this morning.  She is here, upset, because they did not do anything.  She is requesting pain medications. I reviewed the patient's chart.  I reviewed her pain visit.  It appears that the pain specialist do not feel narcotics are indicated for chronic pain.  They did offer a referral to a different pain management if she wished.  She declined Patient states her left foot hurts much more than her right.  She feels there might be something broken in her foot.  She has not had any trauma.  She states she does do a lot of walking on her job    Past Medical History:  Diagnosis Date   CAD (coronary artery disease)    Cervical radiculopathy 08/15/2017   CKD (chronic kidney disease), stage II    COVID-19 01/2022   DM (diabetes mellitus) (HCC)    HTN (hypertension)    Hyperlipidemia    Lumbar radiculopathy 06/21/2019   Last Assessment & Plan: Formatting of this note might be different from the original. Scheduled for laminectomy L5 4/27 Pre-op complete at hospital; labs and ekg reviewed. Scheduled for cardiology eval in one day Medical cleared for procedure by IM evaluation.   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 Severe tendinosis of the supraspinatus tendon.  2. Mild tendinosis of the infraspinatus tendon.  3. Thickening of the inferior joint capsule  and intermediate signal  material effacing the normal subcoracoid fat as can be seen with  adhesive capsulitis.    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    Patient Active Problem List   Diagnosis Date Noted   Chronic pancreatitis (HCC) 05/13/2023   Elevated LFTs 05/13/2023   Common bile duct dilation 05/13/2023   Chronic midline low back pain 07/19/2022   History of lumbar fusion 07/19/2022   Chronic left SI joint pain 07/19/2022   Numbness and tingling of left leg 07/19/2022   Chronic pain syndrome 07/19/2022   Uncontrolled type 1 diabetes mellitus with hyperglycemia, with long-term current use of insulin  (HCC) 06/23/2022   CKD stage G3b/A3, GFR 30-44 and albumin creatinine ratio >300 mg/g (HCC) 06/06/2022   Transaminitis 06/06/2022   Acute pancreatitis 06/06/2022   Ulnar neuropathy of both upper extremities 12/23/2021   Hyperlipidemia 10/01/2021   Diabetic neuropathy (HCC) 09/30/2021   Meralgia paresthetica, right lower limb 09/30/2021   Gait instability 07/30/2021   Migraine without status migrainosus, not intractable 07/30/2021   Poorly controlled type 2 diabetes mellitus with peripheral neuropathy (HCC) 06/05/2021   Menopausal symptoms 04/15/2021   Anxiety 03/25/2021   CAD (coronary artery disease)    HTN (hypertension)    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   Heart valve disease 01/06/2021   Vitamin B12 deficiency 12/03/2020   Vitamin  D deficiency 12/03/2020   Chronic left shoulder pain 11/25/2020   Generalized anxiety disorder 11/25/2020   Psoriasis 01/01/2020   Lumbar radiculopathy 06/21/2019   Uterine leiomyoma 03/06/2019   Depression with anxiety 02/02/2018   Primary insomnia 02/02/2018   Class 2 severe obesity due to excess calories with serious comorbidity and body mass index (BMI) of 38.0 to 38.9 in adult (HCC) 10/12/2017   Insulin  pump in place 10/12/2017    Cervical radiculopathy 08/15/2017   Family history of early CAD 07/21/2017   Obstructive sleep apnea syndrome 07/21/2017   Chronic right shoulder pain 05/30/2017   Type 2 diabetes mellitus with mild nonproliferative diabetic retinopathy without macular edema, bilateral (HCC) 03/18/2017   Congestive heart failure (HCC) 03/16/2017   Other cervical disc degeneration, unspecified cervical region 11/11/2016   Adhesive capsulitis of right shoulder 10/08/2016   GERD (gastroesophageal reflux disease) 07/19/2016   Spinal stenosis of cervical region 12/01/2015   Cervical disc herniation 12/01/2015   Bilateral carpal tunnel syndrome 11/04/2015   Lesion of ulnar nerve, left upper limb 11/04/2015   Stented coronary artery 07/07/2015    Past Surgical History:  Procedure Laterality Date   APPENDECTOMY     BACK SURGERY     CESAREAN SECTION     CORONARY ANGIOPLASTY WITH STENT PLACEMENT     MULTIPLE TOOTH EXTRACTIONS     ROTATOR CUFF REPAIR      OB History   No obstetric history on file.      Home Medications    Prior to Admission medications   Medication Sig Start Date End Date Taking? Authorizing Provider  aspirin  EC 81 MG tablet Take 81 mg by mouth daily.   Yes [provider]  atorvastatin  (LIPITOR) 40 MG tablet TAKE 1 TABLET(40 MG) BY MOUTH DAILY 04/01/23  Yes Rucker, Alethea Y, MD  azelastine (OPTIVAR) 0.05 % ophthalmic solution Apply 1 drop to eye daily. 06/26/23  Yes [provider]  cetirizine (ZYRTEC) 10 MG tablet Take 10 mg by mouth. 03/21/23  Yes [provider]  clopidogrel  (PLAVIX ) 75 MG tablet Take 1 tablet (75 mg total) by mouth daily. 02/14/23  Yes Colette Torrence GRADE, MD  Continuous Blood Gluc Receiver (DEXCOM G7 RECEIVER) DEVI Check sugars daily Dx E11.9 04/05/22  Yes Rucker, Torrence GRADE, MD  Continuous Blood Gluc Sensor (DEXCOM G7 SENSOR) MISC Check sugars daily Dx E11.9 04/05/22  Yes Rucker, Torrence GRADE, MD  Continuous Glucose Transmitter (DEXCOM G6  TRANSMITTER) MISC  07/01/22  Yes [provider]  Crisaborole (EUCRISA) 2 % OINT Apply 1 Application topically 2 (two) times daily. 06/11/20  Yes [provider]  cyclobenzaprine  (FLEXERIL ) 10 MG tablet Take 5-10 mg by mouth. 06/29/23  Yes [provider]  Estradiol -Norethindrone  Acet 0.5-0.1 MG tablet Take 1 tablet by mouth daily. 12/06/22  Yes Rucker, Torrence GRADE, MD  fexofenadine  (ALLEGRA ) 180 MG tablet Take 1 tablet (180 mg total) by mouth daily. 05/06/22  Yes Rucker, Torrence GRADE, MD  glipiZIDE (GLUCOTROL XL) 5 MG 24 hr tablet Take 5 mg by mouth daily with breakfast.   Yes [provider]  HUMALOG  100 UNIT/ML injection INJECT 25 UNITS INTO THE SKIN 3 TIMES DAILY BEFORE MEALS. USE INSULIN  PUMP 05/12/22  Yes Rucker, Torrence GRADE, MD  hydrOXYzine  (ATARAX ) 25 MG tablet Take 1 tablet (25 mg total) by mouth every 8 (eight) hours as needed. 02/14/23  Yes Rucker, Torrence GRADE, MD  Insulin  Aspart, w/Niacinamide, (FIASP ) 100 UNIT/ML SOLN To be used with insulin  pump. 07/08/22  Yes  [provider]  Insulin  Disposable Pump (OMNIPOD 5 G6 PODS, GEN 5,) MISC CHANGE POD EVERY OTHER DAY. 06/07/22  Yes Rucker, Torrence GRADE, MD  isosorbide  mononitrate (IMDUR ) 30 MG 24 hr tablet TAKE 1 TABLET(30 MG) BY MOUTH DAILY 02/21/23  Yes Rucker, Torrence GRADE, MD  JARDIANCE  25 MG TABS tablet Take 1 tablet (25 mg total) by mouth daily. 06/15/22  Yes Rucker, Torrence GRADE, MD  ketoconazole  (NIZORAL ) 2 % shampoo Apply to affected areas 3 x a week for 8 weeks 11/18/22  Yes Rucker, Torrence GRADE, MD  metoprolol  succinate (TOPROL -XL) 100 MG 24 hr tablet Take 1 tablet (100 mg total) by mouth daily. 11/12/22  Yes Pietro Redell RAMAN, MD  mometasone  (NASONEX ) 50 MCG/ACT nasal spray One spray in each nostril twice a day, use left hand for right nostril, and right hand for left nostril.  Please dispense one bottle. 05/24/22  Yes Rucker, Torrence GRADE, MD  montelukast  (SINGULAIR ) 10 MG tablet TAKE 1 TABLET(10 MG) BY MOUTH AT BEDTIME 04/25/23   Yes Rucker, Torrence GRADE, MD  nitroGLYCERIN  (NITROSTAT ) 0.4 MG SL tablet Place 1 tablet (0.4 mg total) under the tongue every 5 (five) minutes as needed for chest pain. 11/12/22  Yes Pietro Redell RAMAN, MD  nystatin  ointment (MYCOSTATIN ) Apply 1 Application topically 2 (two) times daily. 07/22/22  Yes Rucker, Torrence GRADE, MD  olopatadine  (PATADAY ) 0.1 % ophthalmic solution Place 1 drop into both eyes 2 (two) times daily. 05/24/22  Yes Rucker, Torrence GRADE, MD  pantoprazole  (PROTONIX ) 40 MG tablet Take 1 tablet (40 mg total) by mouth 2 (two) times daily. 05/04/23  Yes May, Deanna J, NP  pregabalin  (LYRICA ) 200 MG capsule TAKE 1 CAPSULE BY MOUTH TWICE DAILY 11/09/22  Yes Rucker, Torrence GRADE, MD  sucralfate  (CARAFATE ) 1 g tablet TAKE 1 TABLET(1 GRAM) BY MOUTH FOUR TIMES DAILY AT BEDTIME WITH MEALS 05/09/23  Yes Rucker, Torrence GRADE, MD  topiramate  (TOPAMAX ) 100 MG tablet Take by mouth 2 (two) times daily. 08/17/21  Yes [provider]  traZODone  (DESYREL ) 100 MG tablet TAKE 1 TABLET(100 MG) BY MOUTH AT BEDTIME AS NEEDED FOR SLEEP 02/14/23  Yes Rucker, Torrence GRADE, MD    Family History Family History  Problem Relation Age of Onset   Heart disease Mother    Heart disease Sister     Social History Social History   Tobacco Use   Smoking status: Former    Types: Cigarettes   Smokeless tobacco: Never  Vaping Use   Vaping status: Never Used  Substance Use Topics   Alcohol use: Never   Drug use: Never     Allergies   Tramadol, Nsaids, Sulfamethoxazole-trimethoprim, and Ibuprofen   Review of Systems Review of Systems See HPI  Physical Exam Triage Vital Signs ED Triage Vitals  Encounter Vitals Group     BP 08/24/23 1209 90/61     Girls Systolic BP Percentile --      Girls Diastolic BP Percentile --      Boys Systolic BP Percentile --      Boys Diastolic BP Percentile --      Pulse Rate 08/24/23 1209 76     Resp 08/24/23 1209 17     Temp 08/24/23 1209 97.8 F (36.6 C)     Temp Source  08/24/23 1209 Oral     SpO2 08/24/23 1209 94 %     Weight 08/24/23 1207 272 lb (123.4 kg)     Height 08/24/23 1207 5' 9 (1.753 m)  Head Circumference --      Peak Flow --      Pain Score 08/24/23 1207 10     Pain Loc --      Pain Education --      Exclude from Growth Chart --    No data found.  Updated Vital Signs BP 90/61 (BP Location: Left Arm)   Pulse 76   Temp 97.8 F (36.6 C) (Oral)   Resp 17   Ht 5' 9 (1.753 m)   Wt 123.4 kg   SpO2 94%   BMI 40.17 kg/m       Physical Exam Vitals reviewed.  Constitutional:      General: She is not in acute distress.    Appearance: She is well-developed. She is obese.  HENT:     Head: Normocephalic and atraumatic.   Eyes:     Conjunctiva/sclera: Conjunctivae normal.     Pupils: Pupils are equal, round, and reactive to light.    Cardiovascular:     Rate and Rhythm: Normal rate.  Pulmonary:     Effort: Pulmonary effort is normal. No respiratory distress.  Abdominal:     General: There is no distension.     Palpations: Abdomen is soft.   Musculoskeletal:        General: Normal range of motion.     Cervical back: Normal range of motion.       Feet:   Skin:    General: Skin is warm and dry.   Neurological:     Mental Status: She is alert.      UC Treatments / Results  Labs (all labs ordered are listed, but only abnormal results are displayed) Labs Reviewed - No data to display  EKG   Radiology DG Foot Complete Left Result Date: 08/24/2023 CLINICAL DATA:  Left foot pain. EXAM: LEFT FOOT - COMPLETE 3+ VIEW COMPARISON:  Left foot radiograph dated 03/10/2022. FINDINGS: There is no evidence of acute fracture or dislocation. Calcaneal enthesopathy at the insertion of the Achilles tendon and the origin of the central cord of the plantar fascia. Similar mild dorsal talar spurring. No erosive changes. Soft tissues are unremarkable. IMPRESSION: 1. Calcaneal enthesopathy. 2. Similar mild dorsal talar spurring.  Electronically Signed   By: Harrietta Sherry M.D.   On: 08/24/2023 13:01    Procedures Procedures (including critical care time)  Medications Ordered in UC Medications - No data to display  Initial Impression / Assessment and Plan / UC Course  I have reviewed the triage vital signs and the nursing notes.  Pertinent labs & imaging results that were available during my care of the patient were reviewed by me and considered in my medical decision making (see chart for details).     I explained to the patient that her x-rays are negative.  They are the same as her x-rays that were performed in October 2024. I had a discussion with the patient that the most important thing she can do to manage her peripheral neuropathy is to better manage her diabetes I also explained to the patient that the urgent care is not an appropriate place to come for chronic pain management.  I explained to her that I would not be giving her narcotic pain medication.  Final Clinical Impressions(s) / UC Diagnoses   Final diagnoses:  Left foot pain  Diabetic polyneuropathy associated with other specified diabetes mellitus (HCC)  Poorly controlled type 2 diabetes mellitus [facility])     Discharge Instructions  The most important thing you can do for your neuropathy is to control your diabetes And follow-up with your usual neurologist, primary care doctor, and pain specialist If the pain is keeping you awake at night I recommend you take your trazodone   Dr. Maranda will not be providing narcotics for chronic long-term pain   ED Prescriptions   None    PDMP not reviewed this encounter.   Maranda Jamee Jacob, MD 08/24/23 [phone]

## 2023-09-07 ENCOUNTER — Encounter (HOSPITAL_BASED_OUTPATIENT_CLINIC_OR_DEPARTMENT_OTHER): Payer: Self-pay | Admitting: Emergency Medicine

## 2023-09-07 ENCOUNTER — Other Ambulatory Visit: Payer: Self-pay

## 2023-09-07 ENCOUNTER — Emergency Department (HOSPITAL_BASED_OUTPATIENT_CLINIC_OR_DEPARTMENT_OTHER)
Admission: EM | Admit: 2023-09-07 | Discharge: 2023-09-07 | Disposition: A | Discharge status: Home / Self Care | Attending: Emergency Medicine | Admitting: Emergency Medicine

## 2023-09-07 ENCOUNTER — Other Ambulatory Visit (HOSPITAL_BASED_OUTPATIENT_CLINIC_OR_DEPARTMENT_OTHER): Payer: Self-pay

## 2023-09-07 ENCOUNTER — Encounter: Payer: Self-pay | Admitting: Emergency Medicine

## 2023-09-07 ENCOUNTER — Emergency Department (HOSPITAL_BASED_OUTPATIENT_CLINIC_OR_DEPARTMENT_OTHER): Admitting: Radiology

## 2023-09-07 ENCOUNTER — Ambulatory Visit
Admission: EM | Admit: 2023-09-07 | Discharge: 2023-09-07 | Disposition: A | Discharge status: Home / Self Care | Attending: Family Medicine | Admitting: Family Medicine

## 2023-09-07 DIAGNOSIS — I129 Hypertensive chronic kidney disease with stage 1 through stage 4 chronic kidney disease, or unspecified chronic kidney disease: Secondary | ICD-10-CM | POA: Diagnosis not present

## 2023-09-07 DIAGNOSIS — Z7901 Long term (current) use of anticoagulants: Secondary | ICD-10-CM | POA: Diagnosis not present

## 2023-09-07 DIAGNOSIS — M7731 Calcaneal spur, right foot: Secondary | ICD-10-CM

## 2023-09-07 DIAGNOSIS — Z7982 Long term (current) use of aspirin: Secondary | ICD-10-CM | POA: Insufficient documentation

## 2023-09-07 DIAGNOSIS — E1122 Type 2 diabetes mellitus with diabetic chronic kidney disease: Secondary | ICD-10-CM | POA: Diagnosis not present

## 2023-09-07 DIAGNOSIS — Z79899 Other long term (current) drug therapy: Secondary | ICD-10-CM | POA: Insufficient documentation

## 2023-09-07 DIAGNOSIS — M79671 Pain in right foot: Secondary | ICD-10-CM

## 2023-09-07 DIAGNOSIS — Z794 Long term (current) use of insulin: Secondary | ICD-10-CM | POA: Insufficient documentation

## 2023-09-07 DIAGNOSIS — E1143 Type 2 diabetes mellitus with diabetic autonomic (poly)neuropathy: Secondary | ICD-10-CM

## 2023-09-07 DIAGNOSIS — M722 Plantar fascial fibromatosis: Secondary | ICD-10-CM | POA: Diagnosis not present

## 2023-09-07 DIAGNOSIS — Z87891 Personal history of nicotine dependence: Secondary | ICD-10-CM | POA: Diagnosis not present

## 2023-09-07 DIAGNOSIS — I2510 Atherosclerotic heart disease of native coronary artery without angina pectoris: Secondary | ICD-10-CM | POA: Diagnosis not present

## 2023-09-07 DIAGNOSIS — N189 Chronic kidney disease, unspecified: Secondary | ICD-10-CM | POA: Insufficient documentation

## 2023-09-07 MED ORDER — PREDNISONE 10 MG PO TABS
40.0000 mg | ORAL_TABLET | Freq: Every day | ORAL | 0 refills | Status: DC
  Filled 2023-09-07: qty 20, 5d supply, fill #0

## 2023-09-07 MED ORDER — PREDNISONE 50 MG PO TABS
ORAL_TABLET | ORAL | 0 refills | Status: DC
Start: 2023-09-07 — End: 2023-09-07

## 2023-09-07 NOTE — Discharge Instructions (Addendum)
 You need to limit walking while your foot is painful You need to wear shoe that has better padding in your heel Call your foot doctor to be seen in specially follow-up  Steroids will help with the inflammation.  I am prescribing prednisone  once a day for 5 days.  They may raise your blood sugar.  Be very careful with your diet while on this prednisone 

## 2023-09-07 NOTE — ED Triage Notes (Signed)
 Patient c/o right heel pain for a couple of days. Worse when she applies pressure from walking, no know injury.  Patient does have neuropathy.  The pain is radiating up her leg.  Patient has been taken Tylenol .  Appt w/pain management at the end of the month.

## 2023-09-07 NOTE — ED Provider Notes (Addendum)
 [place] EMERGENCY DEPARTMENT AT [facility] Provider Note   CSN: [id number] Arrival date & time: 09/07/23  1211     Patient presents with: Foot Pain   Jean Calderon is a 53 y.o. female.   Patient with a complaint of right foot pain mostly around the heel for 3 days.  Patient known to have neuropathy problems in both feet.  Recently had an x-ray of her left foot at the end of June.  Patient is followed by neurology podiatry and recent referral to pain management.  The urgent care wanted to start her on prednisone  patient really prefers narcotic pain medicine.  They did not want to do that at the urgent care they apparently have seen her before.  Patient last had a narcotic prescription in June.  Patient has been struggling with good diabetic control.  Which have been part of the problems they think with the neuropathy.  Patient has been on Lyrica  somewhat long-term.  Past medical history significant for the diabetes neuropathy coronary artery disease hypertension chronic kidney disease cervical radiculopathy lumbar radiculopathy hyperlipidemia.  MI without specifics.  Past surgical history in for appendectomy no mention of cardiac cath patient was a former smoker.  Chart review shows that patient seen June 6 for foot pain June 25 and June 9 today.  The urgent care there [facility] formally diagnosed her with plantar fasciitis of the right foot and wanted to treat her with steroids.  Patient refused that based on their notes.  Patient came here.       Prior to Admission medications   Medication Sig Start Date End Date Taking? Authorizing Provider  aspirin  EC 81 MG tablet Take 81 mg by mouth daily.    [provider]  atorvastatin  (LIPITOR) 40 MG tablet TAKE 1 TABLET(40 MG) BY MOUTH DAILY 04/01/23   Rucker, Alethea Y, MD  azelastine (OPTIVAR) 0.05 % ophthalmic solution Apply 1 drop to eye daily. 06/26/23   [provider]  cetirizine (ZYRTEC) 10 MG tablet Take 10 mg  by mouth. 03/21/23   [provider]  clopidogrel  (PLAVIX ) 75 MG tablet Take 1 tablet (75 mg total) by mouth daily. 02/14/23   Colette Torrence GRADE, MD  Continuous Blood Gluc Receiver (DEXCOM G7 RECEIVER) DEVI Check sugars daily Dx E11.9 04/05/22   Colette Torrence GRADE, MD  Continuous Blood Gluc Sensor (DEXCOM G7 SENSOR) MISC Check sugars daily Dx E11.9 04/05/22   Colette Torrence GRADE, MD  Continuous Glucose Transmitter (DEXCOM G6 TRANSMITTER) MISC  07/01/22   [provider]  Crisaborole (EUCRISA) 2 % OINT Apply 1 Application topically 2 (two) times daily. 06/11/20   [provider]  cyclobenzaprine  (FLEXERIL ) 10 MG tablet Take 5-10 mg by mouth. 06/29/23   [provider]  Estradiol -Norethindrone  Acet 0.5-0.1 MG tablet Take 1 tablet by mouth daily. 12/06/22   Colette Torrence GRADE, MD  fexofenadine  (ALLEGRA ) 180 MG tablet Take 1 tablet (180 mg total) by mouth daily. 05/06/22   Rucker, Torrence GRADE, MD  glipiZIDE (GLUCOTROL XL) 5 MG 24 hr tablet Take 5 mg by mouth daily with breakfast.    [provider]  HUMALOG  100 UNIT/ML injection INJECT 25 UNITS INTO THE SKIN 3 TIMES DAILY BEFORE MEALS. USE INSULIN  PUMP 05/12/22   Colette Torrence GRADE, MD  hydrOXYzine  (ATARAX ) 25 MG tablet Take 1 tablet (25 mg total) by mouth every 8 (eight) hours as needed. 02/14/23   Colette Torrence GRADE, MD  Insulin  Aspart, w/Niacinamide, (FIASP ) 100 UNIT/ML SOLN To be used  with insulin  pump. 07/08/22   [provider]  Insulin  Disposable Pump (OMNIPOD 5 G6 PODS, GEN 5,) MISC CHANGE POD EVERY OTHER DAY. 06/07/22   Colette Torrence GRADE, MD  isosorbide  mononitrate (IMDUR ) 30 MG 24 hr tablet TAKE 1 TABLET(30 MG) BY MOUTH DAILY 02/21/23   Colette Torrence GRADE, MD  JARDIANCE  25 MG TABS tablet Take 1 tablet (25 mg total) by mouth daily. 06/15/22   Colette Torrence GRADE, MD  ketoconazole  (NIZORAL ) 2 % shampoo Apply to affected areas 3 x a week for 8 weeks 11/18/22   Colette Torrence GRADE, MD  metoprolol  succinate (TOPROL -XL) 100 MG  24 hr tablet Take 1 tablet (100 mg total) by mouth daily. 11/12/22   Pietro Redell RAMAN, MD  mometasone  (NASONEX ) 50 MCG/ACT nasal spray One spray in each nostril twice a day, use left hand for right nostril, and right hand for left nostril.  Please dispense one bottle. 05/24/22   Colette Torrence GRADE, MD  montelukast  (SINGULAIR ) 10 MG tablet TAKE 1 TABLET(10 MG) BY MOUTH AT BEDTIME 04/25/23   Rucker, Torrence GRADE, MD  nitroGLYCERIN  (NITROSTAT ) 0.4 MG SL tablet Place 1 tablet (0.4 mg total) under the tongue every 5 (five) minutes as needed for chest pain. 11/12/22   Pietro Redell RAMAN, MD  nystatin  ointment (MYCOSTATIN ) Apply 1 Application topically 2 (two) times daily. 07/22/22   Colette Torrence GRADE, MD  olopatadine  (PATADAY ) 0.1 % ophthalmic solution Place 1 drop into both eyes 2 (two) times daily. 05/24/22   Colette Torrence GRADE, MD  pantoprazole  (PROTONIX ) 40 MG tablet Take 1 tablet (40 mg total) by mouth 2 (two) times daily. 05/04/23   May, Deanna J, NP  predniSONE  (DELTASONE ) 50 MG tablet Take once a day for 5 days.  Take with food 09/07/23   Maranda Jamee Jacob, MD  pregabalin  (LYRICA ) 200 MG capsule TAKE 1 CAPSULE BY MOUTH TWICE DAILY 11/09/22   Colette Torrence GRADE, MD  sucralfate  (CARAFATE ) 1 g tablet TAKE 1 TABLET(1 GRAM) BY MOUTH FOUR TIMES DAILY AT BEDTIME WITH MEALS 05/09/23   Colette Torrence GRADE, MD  topiramate  (TOPAMAX ) 100 MG tablet Take by mouth 2 (two) times daily. 08/17/21   [provider]  traZODone  (DESYREL ) 100 MG tablet TAKE 1 TABLET(100 MG) BY MOUTH AT BEDTIME AS NEEDED FOR SLEEP 02/14/23   Colette Torrence GRADE, MD    Allergies: Tramadol, Nsaids, Sulfamethoxazole-trimethoprim, and Ibuprofen    Review of Systems  Constitutional:  Negative for chills and fever.  HENT:  Negative for ear pain and sore throat.   Eyes:  Negative for pain and visual disturbance.  Respiratory:  Negative for cough and shortness of breath.   Cardiovascular:  Negative for chest pain and palpitations.  Gastrointestinal:   Negative for abdominal pain and vomiting.  Genitourinary:  Negative for dysuria and hematuria.  Musculoskeletal:  Negative for arthralgias and back pain.  Skin:  Negative for color change and rash.  Neurological:  Positive for numbness. Negative for seizures and syncope.  All other systems reviewed and are negative.   Updated Vital Signs BP 102/62   Pulse 75   Temp 98.1 F (36.7 C) (Oral)   Resp 16   SpO2 94%   Physical Exam Vitals and nursing note reviewed.  Constitutional:      General: She is not in acute distress.    Appearance: She is well-developed.  HENT:     Head: Normocephalic and atraumatic.  Eyes:     Conjunctiva/sclera: Conjunctivae normal.  Cardiovascular:  Rate and Rhythm: Normal rate and regular rhythm.     Heart sounds: No murmur heard. Pulmonary:     Effort: Pulmonary effort is normal. No respiratory distress.     Breath sounds: Normal breath sounds.  Abdominal:     Palpations: Abdomen is soft.     Tenderness: There is no abdominal tenderness.  Musculoskeletal:        General: No swelling.     Cervical back: Neck supple.     Comments: Patient with numbness to both feet.  Good cap refill to all toes.  Some tenderness to palpation to the heel of the right foot.  May be some slight increased warmth.  Both feet are warm.  But right foot is a little bit warmer.  Skin:    General: Skin is warm and dry.     Capillary Refill: Capillary refill takes less than 2 seconds.  Neurological:     Mental Status: She is alert and oriented to person, place, and time. Mental status is at baseline.     Sensory: Sensory deficit present.  Psychiatric:        Mood and Affect: Mood normal.     (all labs ordered are listed, but only abnormal results are displayed) Labs Reviewed - No data to display  EKG: None  Radiology: No results found.   Procedures   Medications Ordered in the ED - No data to display                                  Medical Decision  Making Amount and/or Complexity of Data Reviewed Radiology: ordered.   Will get x-ray of the right foot just make sure no foreign body or any evidence of a stress fracture.  Patient last had x-rays of the right foot in January.  Patient is followed by podiatry has an upcoming appointment with pain management is also followed by neurology.  And has a primary care provider.  X-ray of the right foot shows no acute abnormalities's persistence of the right heel spur is still there.  I agree with patient's provide is home earlier for the steroids we can give crutches to take her off the foot and following up with her podiatrist.  Patient clearly wants narcotics.  But her doctor that saw her this morning and knows her better than I do was not comfortable with represcribing narcotics for her.  Final diagnoses:  Right foot pain    ED Discharge Orders     None          Geraldene Hamilton, MD 09/07/23 1300    Geraldene Hamilton, MD 09/07/23 1343

## 2023-09-07 NOTE — ED Notes (Signed)
 Pt crutches given and education provided on proper use of crutches. Pt verbalized understanding and demonstrated proper technique and utilization of crutches with ambulation. Pt d/c instructions, medications, and follow-up care reviewed with pt. Pt verbalized understanding and had no further questions at time of d/c. Pt CA&Ox4, ambulatory with crutches, and in NAD at time of d/c

## 2023-09-07 NOTE — ED Triage Notes (Signed)
 t caox4, ambulatory from UC c/o R heel pain x3 days that worsens when walking. Pt denies any recent injury. PMH neuropathy. Last took Tylenol  at 0400 this morning.

## 2023-09-07 NOTE — Discharge Instructions (Addendum)
 Crutches as needed.  To rest the foot.  Strongly recommend that she take the prednisone  that was prescribed by the med [facility].  Follow-up with pain management as scheduled.  Would make an appointment to follow back up with your podiatrist.  Canceled your prednisone  prescription that [facility] sent to the [facility] pharmacy.  You have a prescription here to pick up for the prednisone .  Work note provided.

## 2023-09-07 NOTE — ED Provider Notes (Signed)
 Jean Calderon    CSN: [id number] Arrival date & time: 09/07/23  1037      History   Chief Complaint Chief Complaint  Patient presents with   Foot Pain    HPI Jean Calderon is a 53 y.o. female.   Patient is back here with foot pain.  I just saw her with foot pain about 3 weeks ago.  At that time I let her know that I would not be giving her narcotics for her foot pain.  Please refer to that note.  She is under the Calderon of neurology.  Pain management.  Orthopedics.  Podiatry.  She has poorly controlled diabetes.  She comes in here stating she cannot take tramadol, cannot take any NSAID S, and is taking as much Tylenol  she can.  Requests alternate medicine for pain.  I have let her know that I will not give her narcotics.  In any event she is here today complaining of right heel pain.  She is wearing a flat unsupportive shoe.  Complains of pain in her right heel.  Has known plantar spurs.  Has pain in the area of her right plantar fascia.    Past Medical History:  Diagnosis Date   CAD (coronary artery disease)    Cervical radiculopathy 08/15/2017   CKD (chronic kidney disease), stage II    COVID-19 01/2022   DM (diabetes mellitus) (HCC)    HTN (hypertension)    Hyperlipidemia    Lumbar radiculopathy 06/21/2019   Last Assessment & Plan: Formatting of this note might be different from the original. Scheduled for laminectomy L5 4/27 Pre-op complete at hospital; labs and ekg reviewed. Scheduled for cardiology eval in one day Medical cleared for procedure by IM evaluation.   Myocardial infarction [facility])    Neuropathy    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   1. Severe tendinosis of the supraspinatus tendon.  2. Mild tendinosis of the infraspinatus tendon.  3. Thickening of the inferior joint capsule and intermediate signal  material effacing the normal subcoracoid fat as can be seen with  adhesive capsulitis.    Other cervical disc degeneration, unspecified cervical  region 11/11/2016   Ulnar neuropathy of both upper extremities 12/23/2021    Patient Active Problem List   Diagnosis Date Noted   Chronic pancreatitis (HCC) 05/13/2023   Elevated LFTs 05/13/2023   Common bile duct dilation 05/13/2023   Chronic midline low back pain 07/19/2022   History of lumbar fusion 07/19/2022   Chronic left SI joint pain 07/19/2022   Numbness and tingling of left leg 07/19/2022   Chronic pain syndrome 07/19/2022   Uncontrolled type 1 diabetes mellitus with hyperglycemia, with long-term current use of insulin  (HCC) 06/23/2022   CKD stage G3b/A3, GFR 30-44 and albumin creatinine ratio >300 mg/g (HCC) 06/06/2022   Transaminitis 06/06/2022   Acute pancreatitis 06/06/2022   Ulnar neuropathy of both upper extremities 12/23/2021   Hyperlipidemia 10/01/2021   Diabetic neuropathy (HCC) 09/30/2021   Meralgia paresthetica, right lower limb 09/30/2021   Gait instability 07/30/2021   Migraine without status migrainosus, not intractable 07/30/2021   Poorly controlled type 2 diabetes mellitus with peripheral neuropathy (HCC) 06/05/2021   Menopausal symptoms 04/15/2021   Anxiety 03/25/2021   CAD (coronary artery disease)    HTN (hypertension)    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   Heart valve disease 01/06/2021   Vitamin B12 deficiency 12/03/2020   Vitamin D deficiency 12/03/2020   Chronic left shoulder pain 11/25/2020   Generalized anxiety  disorder 11/25/2020   Psoriasis 01/01/2020   Lumbar radiculopathy 06/21/2019   Uterine leiomyoma 03/06/2019   Depression with anxiety 02/02/2018   Primary insomnia 02/02/2018   Class 2 severe obesity due to excess calories with serious comorbidity and body mass index (BMI) of 38.0 to 38.9 in adult (HCC) 10/12/2017   Insulin  pump in place 10/12/2017   Cervical radiculopathy 08/15/2017   Family history of early CAD 07/21/2017   Obstructive sleep apnea syndrome 07/21/2017   Chronic right shoulder pain 05/30/2017   Type 2  diabetes mellitus with mild nonproliferative diabetic retinopathy without macular edema, bilateral (HCC) 03/18/2017   Congestive heart failure (HCC) 03/16/2017   Other cervical disc degeneration, unspecified cervical region 11/11/2016   Adhesive capsulitis of right shoulder 10/08/2016   GERD (gastroesophageal reflux disease) 07/19/2016   Spinal stenosis of cervical region 12/01/2015   Cervical disc herniation 12/01/2015   Bilateral carpal tunnel syndrome 11/04/2015   Lesion of ulnar nerve, left upper limb 11/04/2015   Stented coronary artery 07/07/2015    Past Surgical History:  Procedure Laterality Date   APPENDECTOMY     BACK SURGERY     CESAREAN SECTION     CORONARY ANGIOPLASTY WITH STENT PLACEMENT     MULTIPLE TOOTH EXTRACTIONS     ROTATOR CUFF REPAIR      OB History   No obstetric history on file.      Home Medications    Prior to Admission medications   Medication Sig Start Date End Date Taking? Authorizing Provider  aspirin  EC 81 MG tablet Take 81 mg by mouth daily.   Yes [provider]  atorvastatin  (LIPITOR) 40 MG tablet TAKE 1 TABLET(40 MG) BY MOUTH DAILY 04/01/23  Yes Rucker, Alethea Y, MD  azelastine (OPTIVAR) 0.05 % ophthalmic solution Apply 1 drop to eye daily. 06/26/23  Yes [provider]  cetirizine (ZYRTEC) 10 MG tablet Take 10 mg by mouth. 03/21/23  Yes [provider]  clopidogrel  (PLAVIX ) 75 MG tablet Take 1 tablet (75 mg total) by mouth daily. 02/14/23  Yes Colette Torrence GRADE, MD  Continuous Blood Gluc Receiver (DEXCOM G7 RECEIVER) DEVI Check sugars daily Dx E11.9 04/05/22  Yes Rucker, Torrence GRADE, MD  Continuous Blood Gluc Sensor (DEXCOM G7 SENSOR) MISC Check sugars daily Dx E11.9 04/05/22  Yes Rucker, Torrence GRADE, MD  Continuous Glucose Transmitter (DEXCOM G6 TRANSMITTER) MISC  07/01/22  Yes [provider]  Crisaborole (EUCRISA) 2 % OINT Apply 1 Application topically 2 (two) times daily. 06/11/20  Yes [provider]   cyclobenzaprine  (FLEXERIL ) 10 MG tablet Take 5-10 mg by mouth. 06/29/23  Yes [provider]  Estradiol -Norethindrone  Acet 0.5-0.1 MG tablet Take 1 tablet by mouth daily. 12/06/22  Yes Rucker, Torrence GRADE, MD  fexofenadine  (ALLEGRA ) 180 MG tablet Take 1 tablet (180 mg total) by mouth daily. 05/06/22  Yes Rucker, Torrence GRADE, MD  glipiZIDE (GLUCOTROL XL) 5 MG 24 hr tablet Take 5 mg by mouth daily with breakfast.   Yes [provider]  HUMALOG  100 UNIT/ML injection INJECT 25 UNITS INTO THE SKIN 3 TIMES DAILY BEFORE MEALS. USE INSULIN  PUMP 05/12/22  Yes Rucker, Torrence GRADE, MD  hydrOXYzine  (ATARAX ) 25 MG tablet Take 1 tablet (25 mg total) by mouth every 8 (eight) hours as needed. 02/14/23  Yes Rucker, Torrence GRADE, MD  Insulin  Aspart, w/Niacinamide, (FIASP ) 100 UNIT/ML SOLN To be used with insulin  pump. 07/08/22  Yes [provider]  Insulin  Disposable Pump (OMNIPOD 5 G6 PODS, GEN 5,) MISC  CHANGE POD EVERY OTHER DAY. 06/07/22  Yes Rucker, Torrence GRADE, MD  isosorbide  mononitrate (IMDUR ) 30 MG 24 hr tablet TAKE 1 TABLET(30 MG) BY MOUTH DAILY 02/21/23  Yes Rucker, Torrence GRADE, MD  JARDIANCE  25 MG TABS tablet Take 1 tablet (25 mg total) by mouth daily. 06/15/22  Yes Rucker, Torrence GRADE, MD  ketoconazole  (NIZORAL ) 2 % shampoo Apply to affected areas 3 x a week for 8 weeks 11/18/22  Yes Rucker, Torrence GRADE, MD  metoprolol  succinate (TOPROL -XL) 100 MG 24 hr tablet Take 1 tablet (100 mg total) by mouth daily. 11/12/22  Yes Pietro Redell RAMAN, MD  mometasone  (NASONEX ) 50 MCG/ACT nasal spray One spray in each nostril twice a day, use left hand for right nostril, and right hand for left nostril.  Please dispense one bottle. 05/24/22  Yes Rucker, Torrence GRADE, MD  montelukast  (SINGULAIR ) 10 MG tablet TAKE 1 TABLET(10 MG) BY MOUTH AT BEDTIME 04/25/23  Yes Rucker, Torrence GRADE, MD  nitroGLYCERIN  (NITROSTAT ) 0.4 MG SL tablet Place 1 tablet (0.4 mg total) under the tongue every 5 (five) minutes as needed for chest pain. 11/12/22   Yes Pietro Redell RAMAN, MD  nystatin  ointment (MYCOSTATIN ) Apply 1 Application topically 2 (two) times daily. 07/22/22  Yes Rucker, Torrence GRADE, MD  olopatadine  (PATADAY ) 0.1 % ophthalmic solution Place 1 drop into both eyes 2 (two) times daily. 05/24/22  Yes Rucker, Torrence GRADE, MD  pantoprazole  (PROTONIX ) 40 MG tablet Take 1 tablet (40 mg total) by mouth 2 (two) times daily. 05/04/23  Yes May, Deanna J, NP  predniSONE  (DELTASONE ) 50 MG tablet Take once a day for 5 days.  Take with food 09/07/23  Yes Maranda Jamee Jacob, MD  pregabalin  (LYRICA ) 200 MG capsule TAKE 1 CAPSULE BY MOUTH TWICE DAILY 11/09/22  Yes Rucker, Torrence GRADE, MD  sucralfate  (CARAFATE ) 1 g tablet TAKE 1 TABLET(1 GRAM) BY MOUTH FOUR TIMES DAILY AT BEDTIME WITH MEALS 05/09/23  Yes Rucker, Torrence GRADE, MD  topiramate  (TOPAMAX ) 100 MG tablet Take by mouth 2 (two) times daily. 08/17/21  Yes [provider]  traZODone  (DESYREL ) 100 MG tablet TAKE 1 TABLET(100 MG) BY MOUTH AT BEDTIME AS NEEDED FOR SLEEP 02/14/23  Yes Rucker, Torrence GRADE, MD    Family History Family History  Problem Relation Age of Onset   Heart disease Mother    Heart disease Sister     Social History Social History   Tobacco Use   Smoking status: Former    Types: Cigarettes   Smokeless tobacco: Never  Vaping Use   Vaping status: Never Used  Substance Use Topics   Alcohol use: Never   Drug use: Never     Allergies   Tramadol, Nsaids, Sulfamethoxazole-trimethoprim, and Ibuprofen   Review of Systems Review of Systems See HPI  Physical Exam Triage Vital Signs ED Triage Vitals  Encounter Vitals Group     BP 09/07/23 1049 90/60     Girls Systolic BP Percentile --      Girls Diastolic BP Percentile --      Boys Systolic BP Percentile --      Boys Diastolic BP Percentile --      Pulse Rate 09/07/23 1049 76     Resp 09/07/23 1049 18     Temp 09/07/23 1049 98.2 F (36.8 C)     Temp Source 09/07/23 1049 Oral     SpO2 09/07/23 1049 94 %     Weight  09/07/23 1051 272 lb (123.4 kg)  Height 09/07/23 1051 5' 9 (1.753 m)     Head Circumference --      Peak Flow --      Pain Score 09/07/23 1051 9     Pain Loc --      Pain Education --      Exclude from Growth Chart --    No data found.  Updated Vital Signs BP 90/60 (BP Location: Left Arm)   Pulse 76   Temp 98.2 F (36.8 C) (Oral)   Resp 18   Ht 5' 9 (1.753 m)   Wt 123.4 kg   SpO2 94%   BMI 40.17 kg/m      Physical Exam Constitutional:      General: She is not in acute distress.    Appearance: She is well-developed. She is obese.  HENT:     Head: Normocephalic and atraumatic.  Eyes:     Conjunctiva/sclera: Conjunctivae normal.     Pupils: Pupils are equal, round, and reactive to light.  Cardiovascular:     Rate and Rhythm: Normal rate.  Pulmonary:     Effort: Pulmonary effort is normal. No respiratory distress.  Abdominal:     General: There is no distension.     Palpations: Abdomen is soft.  Musculoskeletal:        General: Normal range of motion.     Cervical back: Normal range of motion.     Comments: Tenderness to palpation on the plantar aspect of the right heel.  Mild squeeze tenderness of right heel.  Skin:    General: Skin is warm and dry.  Neurological:     Mental Status: She is alert.     Gait: Gait abnormal.      UC Treatments / Results  Labs (all labs ordered are listed, but only abnormal results are displayed) Labs Reviewed - No data to display  EKG   Radiology No results found.  Procedures Procedures (including critical Calderon time)  Medications Ordered in UC Medications - No data to display  Initial Impression / Assessment and Plan / UC Course  I have reviewed the triage vital signs and the nursing notes.  Pertinent labs & imaging results that were available during my Calderon of the patient were reviewed by me and considered in my medical decision making (see chart for details).     Patient is had no accident or injury to  suggest need for x-rays.  She has had x-rays within the last year.  They document a plantar spur.  As soon as documented in the HPI she has multiple specialist for whom she sees for her chronic neuropathy and foot pain.  I have told her on prior occasions I will not give her narcotic medications for her pain.  Patient is unhappy, and leaves choosing not to take the steroids as prescribed for her Final Clinical Impressions(s) / UC Diagnoses   Final diagnoses:  Plantar fasciitis of right foot  Inflammatory heel pain, right     Discharge Instructions      You need to limit walking while your foot is painful You need to wear shoe that has better padding in your heel Call your foot doctor to be seen in specially follow-up  Steroids will help with the inflammation.  I am prescribing prednisone  once a day for 5 days.  They may raise your blood sugar.  Be very careful with your diet while on this prednisone      ED Prescriptions     Medication Sig  Dispense Auth. Provider   predniSONE  (DELTASONE ) 50 MG tablet Take once a day for 5 days.  Take with food 5 tablet Maranda Jamee Jacob, MD      PDMP not reviewed this encounter.   Maranda Jamee Jacob, MD 09/07/23 1126

## 2023-09-12 ENCOUNTER — Ambulatory Visit: Admitting: Podiatry

## 2023-09-15 ENCOUNTER — Other Ambulatory Visit: Payer: Self-pay | Admitting: Family Medicine

## 2023-09-15 DIAGNOSIS — I509 Heart failure, unspecified: Secondary | ICD-10-CM

## 2023-09-15 DIAGNOSIS — I2510 Atherosclerotic heart disease of native coronary artery without angina pectoris: Secondary | ICD-10-CM

## 2023-09-16 ENCOUNTER — Encounter: Payer: Self-pay | Admitting: Cardiology

## 2023-09-16 ENCOUNTER — Encounter: Payer: Self-pay | Admitting: Podiatry

## 2023-09-16 ENCOUNTER — Other Ambulatory Visit (HOSPITAL_BASED_OUTPATIENT_CLINIC_OR_DEPARTMENT_OTHER): Payer: Self-pay

## 2023-09-16 ENCOUNTER — Ambulatory Visit: Admitting: Podiatry

## 2023-09-16 DIAGNOSIS — I509 Heart failure, unspecified: Secondary | ICD-10-CM

## 2023-09-16 DIAGNOSIS — M7661 Achilles tendinitis, right leg: Secondary | ICD-10-CM

## 2023-09-16 DIAGNOSIS — I2510 Atherosclerotic heart disease of native coronary artery without angina pectoris: Secondary | ICD-10-CM

## 2023-09-16 MED ORDER — ACETAMINOPHEN-CODEINE 300-30 MG PO TABS: 1.0000 | ORAL_TABLET | Freq: Three times a day (TID) | ORAL | 0 refills | Status: AC | PRN

## 2023-09-16 NOTE — Progress Notes (Signed)
  Subjective:  Patient ID: Jean Calderon, female    DOB: 1970-08-18,   MRN: [id number]  Chief Complaint  Patient presents with   Foot Pain    They said I have spurs on my foot.  It's been hurting me for about a week.  The pain is unbearable.  I went to the ER.  The other day, I heard a pop in my foot.    53 y.o. female presents for concern of bilateral heel pain that has been ongoing for about a week. She was seen in ED and given a boot to wear. Relates the pain has been significant.   . Requesting to have them trimmed today. Relates burning and tingling in their feet. She is in pain management Patient is diabetic and last A1c was  Lab Results  Component Value Date   HGBA1C 14.0 (A) 05/06/2022   .   PCP:  Lyle Wells Louder, FNP    Denies any other pedal complaints. Denies n/v/f/c.   Past Medical History:  Diagnosis Date   CAD (coronary artery disease)    Cervical radiculopathy 08/15/2017   CKD (chronic kidney disease), stage II    COVID-19 01/2022   DM (diabetes mellitus) (HCC)    HTN (hypertension)    Hyperlipidemia    Lumbar radiculopathy 06/21/2019   Last Assessment & Plan: Formatting of this note might be different from the original. Scheduled for laminectomy L5 4/27 Pre-op complete at hospital; labs and ekg reviewed. Scheduled for cardiology eval in one day Medical cleared for procedure by IM evaluation.   Myocardial infarction [facility])    Neuropathy    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   1. Severe tendinosis of the supraspinatus tendon.  2. Mild tendinosis of the infraspinatus tendon.  3. Thickening of the inferior joint capsule and intermediate signal  material effacing the normal subcoracoid fat as can be seen with  adhesive capsulitis.    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    Objective:  Physical Exam: Vascular: DP/PT pulses 2/4 bilateral. CFT <3 seconds. Normal  hair growth on digits. No edema.  Skin. No lacerations or abrasions bilateral feet.  Musculoskeletal: MMT 5/5 bilateral lower extremities in DF, PF, Inversion and Eversion. Deceased ROM in DF of ankle joint. Tender to right achilles insertion. Tendon intact no palpable delve. Able to plantar flex. Negative thompson test. Tender with DF and PF of ankle.  Neurological: Sensation intact to light touch.   Assessment:   1. Tendonitis, Achilles, right      Plan:  Patient was evaluated and treated and all questions answered. -Xrays reviewed. Spurring noted to bilateral posterior calcaneus.  -Discussed Achilles insertional tendonitis and treatment options with patient.  -Amb ref to PT  -Continue CAM boot  -Tyelnol 3 prescribed.  -Discussed if no improvement will consider MRI/PT/EPAT/PRP injections.  -Patient to return to office in 6 weeks for recheck.    Asberry Failing, DPM

## 2023-09-19 ENCOUNTER — Other Ambulatory Visit (HOSPITAL_BASED_OUTPATIENT_CLINIC_OR_DEPARTMENT_OTHER): Payer: Self-pay

## 2023-09-19 MED ORDER — CLOPIDOGREL BISULFATE 75 MG PO TABS: 75.0000 mg | ORAL_TABLET | Freq: Every day | ORAL | 1 refills | Status: DC

## 2023-09-19 MED ORDER — CLOPIDOGREL BISULFATE 75 MG PO TABS
75.0000 mg | ORAL_TABLET | Freq: Every day | ORAL | 1 refills | Status: DC
  Filled 2023-09-19: qty 90, 90d supply, fill #0

## 2023-09-21 ENCOUNTER — Ambulatory Visit: Attending: Podiatry | Admitting: Physical Therapy

## 2023-09-26 ENCOUNTER — Encounter: Attending: Physical Medicine and Rehabilitation | Admitting: Physical Medicine and Rehabilitation

## 2023-09-26 ENCOUNTER — Other Ambulatory Visit: Payer: Self-pay | Admitting: Family Medicine

## 2023-09-26 DIAGNOSIS — J301 Allergic rhinitis due to pollen: Secondary | ICD-10-CM

## 2023-10-03 ENCOUNTER — Encounter (HOSPITAL_COMMUNITY): Payer: Self-pay | Admitting: Gastroenterology

## 2023-10-03 NOTE — Progress Notes (Signed)
 Attempted to obtain medical history for pre op call via telephone, unable to reach at this time. HIPAA compliant voicemail message left requesting return call to pre surgical testing department.

## 2023-10-04 ENCOUNTER — Telehealth: Payer: Self-pay

## 2023-10-04 NOTE — Telephone Encounter (Signed)
 Procedure:Ultrasound Procedure date: 10/10/23 Procedure location: [facility] Arrival Time: 8:45 am Spoke with the patient Y/N: I called pt at 4:40 pm on 10/04/23 no answer left a VM to call back to confirm her procedure. Any prep concerns? .  Has the patient obtained the prep from the pharmacy ? [name] Do you have a care partner and transportation: . Any additional concerns? Jean Calderon

## 2023-10-10 ENCOUNTER — Encounter (HOSPITAL_COMMUNITY): Payer: Self-pay | Admitting: Gastroenterology

## 2023-10-10 ENCOUNTER — Other Ambulatory Visit: Payer: Self-pay

## 2023-10-10 ENCOUNTER — Ambulatory Visit (HOSPITAL_COMMUNITY)
Admission: RE | Admit: 2023-10-10 | Discharge: 2023-10-10 | Disposition: A | Discharge status: Home / Self Care | Attending: Gastroenterology | Admitting: Gastroenterology

## 2023-10-10 ENCOUNTER — Ambulatory Visit (HOSPITAL_COMMUNITY): Admitting: Certified Registered Nurse Anesthetist

## 2023-10-10 ENCOUNTER — Encounter (HOSPITAL_COMMUNITY)
Admission: RE | Disposition: A | Discharge status: Home / Self Care | Payer: Self-pay | Source: Home / Self Care | Attending: Gastroenterology

## 2023-10-10 DIAGNOSIS — X58XXXA Exposure to other specified factors, initial encounter: Secondary | ICD-10-CM | POA: Diagnosis not present

## 2023-10-10 DIAGNOSIS — Z7902 Long term (current) use of antithrombotics/antiplatelets: Secondary | ICD-10-CM | POA: Diagnosis not present

## 2023-10-10 DIAGNOSIS — T183XXA Foreign body in small intestine, initial encounter: Secondary | ICD-10-CM | POA: Insufficient documentation

## 2023-10-10 DIAGNOSIS — K2289 Other specified disease of esophagus: Secondary | ICD-10-CM

## 2023-10-10 DIAGNOSIS — I129 Hypertensive chronic kidney disease with stage 1 through stage 4 chronic kidney disease, or unspecified chronic kidney disease: Secondary | ICD-10-CM | POA: Diagnosis not present

## 2023-10-10 DIAGNOSIS — N182 Chronic kidney disease, stage 2 (mild): Secondary | ICD-10-CM | POA: Insufficient documentation

## 2023-10-10 DIAGNOSIS — E785 Hyperlipidemia, unspecified: Secondary | ICD-10-CM | POA: Insufficient documentation

## 2023-10-10 DIAGNOSIS — I252 Old myocardial infarction: Secondary | ICD-10-CM | POA: Diagnosis not present

## 2023-10-10 DIAGNOSIS — E1141 Type 2 diabetes mellitus with diabetic mononeuropathy: Secondary | ICD-10-CM | POA: Insufficient documentation

## 2023-10-10 DIAGNOSIS — K861 Other chronic pancreatitis: Secondary | ICD-10-CM | POA: Diagnosis present

## 2023-10-10 DIAGNOSIS — I2510 Atherosclerotic heart disease of native coronary artery without angina pectoris: Secondary | ICD-10-CM | POA: Insufficient documentation

## 2023-10-10 DIAGNOSIS — Z87891 Personal history of nicotine dependence: Secondary | ICD-10-CM | POA: Diagnosis not present

## 2023-10-10 DIAGNOSIS — T182XXA Foreign body in stomach, initial encounter: Secondary | ICD-10-CM | POA: Diagnosis not present

## 2023-10-10 DIAGNOSIS — K838 Other specified diseases of biliary tract: Secondary | ICD-10-CM | POA: Diagnosis present

## 2023-10-10 DIAGNOSIS — R1310 Dysphagia, unspecified: Secondary | ICD-10-CM | POA: Diagnosis not present

## 2023-10-10 HISTORY — PX: ESOPHAGOGASTRODUODENOSCOPY: SHX5428

## 2023-10-10 LAB — GLUCOSE, CAPILLARY
Glucose-Capillary: 302 mg/dL — ABNORMAL HIGH (ref 70–99)
Glucose-Capillary: 329 mg/dL — ABNORMAL HIGH (ref 70–99)

## 2023-10-10 SURGERY — EGD (ESOPHAGOGASTRODUODENOSCOPY)
Anesthesia: Monitor Anesthesia Care

## 2023-10-10 MED ORDER — ONDANSETRON HCL 4 MG/2ML IJ SOLN
INTRAMUSCULAR | Status: DC | PRN
  Administered 2023-10-10 (×2): 4 mg via INTRAVENOUS

## 2023-10-10 MED ORDER — GLYCOPYRROLATE PF 0.2 MG/ML IJ SOSY
PREFILLED_SYRINGE | INTRAMUSCULAR | Status: DC | PRN
  Administered 2023-10-10 (×2): .1 mg via INTRAVENOUS

## 2023-10-10 MED ORDER — DEXMEDETOMIDINE HCL IN NACL 80 MCG/20ML IV SOLN
INTRAVENOUS | Status: DC | PRN
Start: 2023-10-10 — End: 2023-10-10
  Administered 2023-10-10 (×6): 4 ug via INTRAVENOUS

## 2023-10-10 MED ORDER — PROPOFOL 1000 MG/100ML IV EMUL
INTRAVENOUS | Status: AC
  Filled 2023-10-10: qty 100

## 2023-10-10 MED ORDER — SODIUM CHLORIDE 0.9 % IV SOLN: INTRAVENOUS | Status: DC

## 2023-10-10 MED ORDER — PHENYLEPHRINE HCL (PRESSORS) 10 MG/ML IV SOLN
INTRAVENOUS | Status: DC | PRN
  Administered 2023-10-10 (×3): 160 ug via INTRAVENOUS
  Administered 2023-10-10 (×2): 80 ug via INTRAVENOUS
  Administered 2023-10-10: 160 ug via INTRAVENOUS
  Administered 2023-10-10 (×2): 80 ug via INTRAVENOUS

## 2023-10-10 MED ORDER — PROPOFOL 500 MG/50ML IV EMUL
INTRAVENOUS | Status: DC | PRN
Start: 2023-10-10 — End: 2023-10-10
  Administered 2023-10-10: 30 mg via INTRAVENOUS
  Administered 2023-10-10: 150 ug/kg/min via INTRAVENOUS
  Administered 2023-10-10: 50 mg via INTRAVENOUS
  Administered 2023-10-10: 30 mg via INTRAVENOUS
  Administered 2023-10-10: 150 ug/kg/min via INTRAVENOUS
  Administered 2023-10-10: 50 mg via INTRAVENOUS

## 2023-10-10 NOTE — Anesthesia Procedure Notes (Signed)
 Procedure Name: MAC Date/Time: 10/10/2023 10:08 AM  Performed by: Joshua Vernell BROCKS, CRNAPre-anesthesia Checklist: Patient identified, Emergency Drugs available, Suction available and Patient being monitored Patient Re-evaluated:Patient Re-evaluated prior to induction Oxygen Delivery Method: Simple face mask Placement Confirmation: positive ETCO2 and breath sounds checked- equal and bilateral Dental Injury: Teeth and Oropharynx as per pre-operative assessment

## 2023-10-10 NOTE — Anesthesia Preprocedure Evaluation (Signed)
 Anesthesia Evaluation  Patient identified by MRN, date of birth, ID band Patient awake    Reviewed: Allergy & Precautions, NPO status , Patient's Chart, lab work & pertinent test results  History of Anesthesia Complications Negative for: history of anesthetic complications  Airway Mallampati: II  TM Distance: >3 FB Neck ROM: Full    Dental  (+) Edentulous Upper, Edentulous Lower   Pulmonary asthma , neg sleep apnea, neg COPD, Patient abstained from smoking.Not current smoker, former smoker   Pulmonary exam normal breath sounds clear to auscultation       Cardiovascular Exercise Tolerance: Good METShypertension, + CAD, + Past MI, + Cardiac Stents and +CHF  (-) dysrhythmias  Rhythm:Regular Rate:Normal - Systolic murmurs Heart cath 2022: The ejection fraction is 55-65% by visual estimate.   The left ventricular systolic function is normal.   LV end diastolic pressure is normal.   Dominant vessel: the right coronary artery   Left main: 0%   LAD: 50% prox, 30% mid, 40% distal, Diagonal: 1: 30%. Stents are open   Ramus: 30%   Circumflex: 30% prox, 30% mid, 30% distal, OM: 1st: 30%, LPDA: 0%   RCA: 30% prox, 30% mid, 30% distal, RPDA 0%, RPLB 0%     Neuro/Psych  Headaches PSYCHIATRIC DISORDERS Anxiety Depression       GI/Hepatic ,GERD  Medicated,,(+)     (-) substance abuse    Endo/Other  diabetes, Insulin  Dependent  Class 3 obesity  Renal/GU CRFRenal disease     Musculoskeletal   Abdominal  (+) + obese  Peds  Hematology   Anesthesia Other Findings Past Medical History: No date: CAD (coronary artery disease) 08/15/2017: Cervical radiculopathy No date: CKD (chronic kidney disease), stage II 01/2022: COVID-19 No date: DM (diabetes mellitus) (HCC) No date: HTN (hypertension) No date: Hyperlipidemia 06/21/2019: Lumbar radiculopathy     Comment:  Last Assessment & Plan: Formatting of this note might be               different from the original. Scheduled for laminectomy L5              4/27 Pre-op complete at hospital; labs and ekg reviewed.               Scheduled for cardiology eval in one day Medical cleared               for procedure by IM evaluation. No date: Myocardial infarction [facility]) No date: Neuropathy 03/05/2021: Osteoarthritis of left shoulder     Comment:  1. Severe tendinosis of the supraspinatus tendon.  2.               Mild tendinosis of the infraspinatus tendon.  3.               Thickening of the inferior joint capsule and intermediate              signal  material effacing the normal subcoracoid fat as               can be seen with  adhesive capsulitis.  11/11/2016: Other cervical disc degeneration, unspecified cervical  region 12/23/2021: Ulnar neuropathy of both upper extremities  Reproductive/Obstetrics                              Anesthesia Physical Anesthesia Plan  ASA: 3  Anesthesia Plan: MAC   Post-op Pain Management: Minimal or no pain anticipated  Induction: Intravenous  PONV Risk Score and Plan: 2 and Propofol  infusion, TIVA and Ondansetron   Airway Management Planned: Nasal Cannula  Additional Equipment: None  Intra-op Plan:   Post-operative Plan:   Informed Consent: I have reviewed the patients History and Physical, chart, labs and discussed the procedure including the risks, benefits and alternatives for the proposed anesthesia with the patient or authorized representative who has indicated his/her understanding and acceptance.     Dental advisory given  Plan Discussed with: CRNA and Surgeon  Anesthesia Plan Comments: (Discussed risks of anesthesia with patient, including possibility of difficulty with spontaneous ventilation under anesthesia necessitating airway intervention, PONV, and rare risks such as cardiac or respiratory or neurological events, and allergic reactions. Discussed the role of CRNA in  patient's perioperative care. Patient understands. Patient informed about increased incidence of above perioperative risk due to high BMI. Patient understands.  )        Anesthesia Quick Evaluation

## 2023-10-10 NOTE — Transfer of Care (Signed)
 Immediate Anesthesia Transfer of Care Note  Patient: Jean Calderon  Procedure(s) Performed: Procedure(s): EGD (ESOPHAGOGASTRODUODENOSCOPY) (N/A)  Patient Location: PACU  Anesthesia Type:MAC  Level of Consciousness: Patient easily awoken, comfortable, cooperative, following commands, responds to stimulation.   Airway & Oxygen Therapy: Patient spontaneously breathing, ventilating well, oxygen via simple oxygen mask.  Post-op Assessment: Report given to PACU RN, vital signs reviewed and stable, moving all extremities.   Post vital signs: Reviewed and stable.  Complications: No apparent anesthesia complications Last Vitals:  Vitals Value Taken Time  BP 130/58 10/10/23 10:30  Temp    Pulse 73 10/10/23 10:32  Resp 15 10/10/23 10:32  SpO2 99 % 10/10/23 10:32  Vitals shown include unfiled device data.  Last Pain:  Vitals:   10/10/23 0945  TempSrc: Temporal  PainSc: 0-No pain         Complications: No notable events documented.

## 2023-10-10 NOTE — Anesthesia Postprocedure Evaluation (Signed)
 Anesthesia Post Note  Patient: [name]  Procedure(s) Performed: EGD (ESOPHAGOGASTRODUODENOSCOPY)     Patient location during evaluation: Endoscopy Anesthesia Type: MAC Level of consciousness: awake and alert Pain management: pain level controlled Vital Signs Assessment: post-procedure vital signs reviewed and stable Respiratory status: spontaneous breathing, nonlabored ventilation, respiratory function stable and patient connected to nasal cannula oxygen Cardiovascular status: stable and blood pressure returned to baseline Postop Assessment: no apparent nausea or vomiting Anesthetic complications: no   No notable events documented.  Last Vitals:  Vitals:   10/10/23 1146 10/10/23 1150  BP: (!) 101/43 (!) 91/54  Pulse: 76 73  Resp: 14 13  Temp:    SpO2: 92% 92%    Last Pain:  Vitals:   10/10/23 1140  TempSrc:   PainSc: 0-No pain                 Rome Ade

## 2023-10-10 NOTE — Op Note (Signed)
 [facility] Patient Name: Jean Calderon Procedure Date: 10/10/2023 MRN: [id number] Attending MD: Aloha Finner , MD, [id number] Date of Birth: [birth year] CSN: [id number] Age: 53 Admit Type: Outpatient Procedure:                Upper GI endoscopy (intention EUS is not able to                            perform) Indications:              Epigastric abdominal pain, Dysphagia, Abnormal CT                            of the GI tract, Abnormal MRI of the GI tract Providers:                Aloha Finner, MD, Randall Lines, RN, [facility]                            [place], Technician, Vernell Molt, CRNA Referring MD:              Medicines:                Monitored Anesthesia Care Complications:            No immediate complications. Estimated Blood Loss:     Estimated blood loss was minimal. Procedure:                Pre-Anesthesia Assessment:                           - Prior to the procedure, a History and Physical                            was performed, and patient medications and                            allergies were reviewed. The patient's tolerance of                            previous anesthesia was also reviewed. The risks                            and benefits of the procedure and the sedation                            options and risks were discussed with the patient.                            All questions were answered, and informed consent                            was obtained. Prior Anticoagulants: The patient has                            taken Plavix  (clopidogrel ), last dose was 5 days  prior to procedure. ASA Grade Assessment: III - A                            patient with severe systemic disease. After                            reviewing the risks and benefits, the patient was                            deemed in satisfactory condition to undergo the                            procedure.                            After obtaining informed consent, the endoscope was                            passed under direct vision. Throughout the                            procedure, the patient's blood pressure, pulse, and                            oxygen saturations were monitored continuously. The                            [id number]-H190 ([id number]) Olympus endoscope was introduced                            through the mouth, and advanced to the second part                            of duodenum. After obtaining informed consent, the                            endoscope was passed under direct vision.                            Throughout the procedure, the patient's blood                            pressure, pulse, and oxygen saturations were                            monitored continuously.The upper GI endoscopy was                            accomplished without difficulty. The patient                            tolerated the procedure. Scope In: Scope Out: Findings:      White nummular lesions were noted in the entire esophagus. Biopsies were       taken with a  cold forceps for histology to rule out Candida.      The Z-line was irregular and was found 40 cm from the incisors.      A large amount of food (residue) was found in the entire examined       stomach.      Food (residue) was found in the duodenal bulb, in the first portion of       the duodenum and in the second portion of the duodenum. Impression:               - White nummular lesions in esophageal mucosa.                            Biopsied.                           - Z-line irregular, 40 cm from the incisors.                           - A large amount of food (residue) in the stomach.                           - Retained food in the duodenum.                           - Impairment of gastric and duodenal mucosa as a                            result of significant foodstuffs. Moderate Sedation:      Not Applicable - Patient had care per  Anesthesia. Recommendation:           - The patient will be observed post-procedure,                            until all discharge criteria are met.                           - Discharge patient to home.                           - Patient has a contact number available for                            emergencies. The signs and symptoms of potential                            delayed complications were discussed with the                            patient. Return to normal activities tomorrow.                            Written discharge instructions were provided to the                            patient.                           -  Gastroparesis diet is recommended.                           - Follow-up pathology and treat for Candida if                            positive).                           - May restart Plavix  this evening or tomorrow                            morning, depending on when she takes it normally.                           - Continue present medications otherwise.                           - Further evaluation/treatment with primary GI, to                            optimize what appears to be gastroparesis (noted in                            prior history).                           - If we are going to try to redo EUS, she will need                            to be on clear liquids the entire day prior to her                            procedure. Will discuss with primary GI to help                            them let me know when she is ready for repeat EUS                            attempt.                           - The findings and recommendations were discussed                            with the patient.                           - The findings and recommendations were discussed                            with the patient's family. Procedure Code(s):        --- Professional ---  56760, 52, Esophagogastroduodenoscopy, flexible,                             transoral; with biopsy, single or multiple Diagnosis Code(s):        --- Professional ---                           K22.89, Other specified disease of esophagus                           R10.13, Epigastric pain                           R13.10, Dysphagia, unspecified                           R93.3, Abnormal findings on diagnostic imaging of                            other parts of digestive tract CPT copyright 2022 [employer]. All rights reserved. The codes documented in this report are preliminary and upon coder review may  be revised to meet current compliance requirements. Aloha Finner, MD 10/10/2023 10:27:16 AM Number of Addenda: 0

## 2023-10-10 NOTE — H&P (Signed)
 GASTROENTEROLOGY PROCEDURE H&P NOTE   Primary Care Physician: Jean Wells Louder, FNP  HPI: Jean Calderon is a 53 y.o. female who presents for EGD/EUS to evaluate dilated bile duct, abnormal LFTs, chronic recurrent pancreatitis with imaging concerning for chronic pancreatitis.  Past Medical History:  Diagnosis Date   CAD (coronary artery disease)    Cervical radiculopathy 08/15/2017   CKD (chronic kidney disease), stage II    COVID-19 01/2022   DM (diabetes mellitus) (HCC)    HTN (hypertension)    Hyperlipidemia    Lumbar radiculopathy 06/21/2019   Last Assessment & Plan: Formatting of this note might be different from the original. Scheduled for laminectomy L5 4/27 Pre-op complete at hospital; labs and ekg reviewed. Scheduled for cardiology eval in one day Medical cleared for procedure by IM evaluation.   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 Severe tendinosis of the supraspinatus tendon.  2. Mild tendinosis of the infraspinatus tendon.  3. Thickening of the inferior joint capsule and intermediate signal  material effacing the normal subcoracoid fat as can be seen with  adhesive capsulitis.    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   Past Surgical History:  Procedure Laterality Date   APPENDECTOMY     BACK SURGERY     CESAREAN SECTION     CORONARY ANGIOPLASTY WITH STENT PLACEMENT     MULTIPLE TOOTH EXTRACTIONS     ROTATOR CUFF REPAIR     No current facility-administered medications for this encounter.   No current facility-administered medications for this encounter. Allergies  Allergen Reactions   Tramadol     Other reaction(s): Mental Status Changes (intolerance) Caused Hallucinations   Nsaids Other (See Comments)    Cannot take because she's on Plavix .   Other reaction(s): Bleeding (intolerance) Cannot take  because she's on Plavix .    Sulfamethoxazole-Trimethoprim Nausea And Vomiting    History of Colitis   Ibuprofen     Not an allergy. Pt stated she can't take it because of current use of ASA and Plavix    Family History  Problem Relation Age of Onset   Heart disease Mother    Heart disease Sister    Social History   Socioeconomic History   Marital status: Married    Spouse name: Not on file   Number of children: 4   Years of education: Not on file   Highest education level: Not on file  Occupational History   Not on file  Tobacco Use   Smoking status: Former    Types: Cigarettes   Smokeless tobacco: Never  Vaping Use   Vaping status: Never Used  Substance and Sexual Activity   Alcohol use: Never   Drug use: Never   Sexual activity: Not on file  Other Topics Concern   Not on file  Social History Narrative   Not on file   Social Drivers of Health   Financial Resource Strain: Not on file  Food Insecurity: Low Risk  (07/15/2023)   Received from [facility]   Hunger Vital Sign    Within the past 12 months, you worried that your food would run out before you got money to buy more: Never true    Within the past 12 months, the food you bought just didn't last and you didn't have money to get more. : Never true  Transportation Needs: No Transportation Needs (07/15/2023)  Received from [facility]    In the past 12 months, has lack of reliable transportation kept you from medical appointments, meetings, work or from getting things needed for daily living? : No  Physical Activity: Not on file  Stress: No Stress Concern Present (06/06/2022)   Received from [facility] - Occupational Stress Questionnaire    Feeling of Stress : Not at all  Social Connections: Unknown (04/23/2022)   Received [facility]   Social Network    Social Network: Not on file  Intimate Partner Violence: Not At Risk (09/26/2023)    Received from [facility]   HITS    Over the last 12 months how often did your partner physically hurt you?: Never    Over the last 12 months how often did your partner insult you or talk down to you?: Never    Over the last 12 months how often did your partner threaten you with physical harm?: Never    Over the last 12 months how often did your partner scream or curse at you?: Never    Physical Exam: Today's Vitals   10/03/23 1409  Weight: 124.3 kg   Body mass index is 40.46 kg/m. GEN: NAD EYE: Sclerae anicteric ENT: MMM CV: Non-tachycardic GI: Soft, NT/ND NEURO:  Alert & Oriented x 3  Lab Results: No results for input(s): WBC, HGB, HCT, PLT in the last 72 hours. BMET No results for input(s): NA, K, CL, CO2, GLUCOSE, BUN, CREATININE, CALCIUM  in the last 72 hours. LFT No results for input(s): PROT, ALBUMIN, AST, ALT, ALKPHOS, BILITOT, BILIDIR, IBILI in the last 72 hours. PT/INR No results for input(s): LABPROT, INR in the last 72 hours.   Impression / Plan: This is a 53 y.o.female who presents for EGD/EUS to evaluate dilated bile duct, abnormal LFTs, chronic recurrent pancreatitis with imaging concerning for chronic pancreatitis.  The risks of an EUS including intestinal perforation, bleeding, infection, aspiration, and medication effects were discussed as was the possibility it may not give a definitive diagnosis if a biopsy is performed.  When a biopsy of the pancreas is done as part of the EUS, there is an additional risk of pancreatitis at the rate of about 1-2%.  It was explained that procedure related pancreatitis is typically mild, although it can be severe and even life threatening, which is why we do not perform random pancreatic biopsies and only biopsy a lesion/area we feel is concerning enough to warrant the risk.  The risks and benefits of endoscopic evaluation/treatment were discussed with the patient and/or family;  these include but are not limited to the risk of perforation, infection, bleeding, missed lesions, lack of diagnosis, severe illness requiring hospitalization, as well as anesthesia and sedation related illnesses.  The patient's history has been reviewed, patient examined, no change in status, and deemed stable for procedure.  The patient and/or family is agreeable to proceed.    Aloha Finner, MD [facility] Office # [phone]

## 2023-10-10 NOTE — Addendum Note (Signed)
 Addendum  created 10/10/23 1418 by Joshua Vernell BROCKS, CRNA   Flowsheet accepted

## 2023-10-10 NOTE — Discharge Instructions (Signed)

## 2023-10-11 ENCOUNTER — Encounter (HOSPITAL_COMMUNITY): Payer: Self-pay | Admitting: Gastroenterology

## 2023-10-11 ENCOUNTER — Telehealth: Payer: Self-pay | Admitting: *Deleted

## 2023-10-11 LAB — SURGICAL PATHOLOGY

## 2023-10-11 NOTE — Telephone Encounter (Signed)
 Spoke to patient who has scheduled appointment with Elida Shawl, NP on 11/03/23 at 130 pm.

## 2023-10-11 NOTE — Telephone Encounter (Signed)
-----   Message from Cathryne PARAS May sent at 10/11/2023  9:13 AM EDT ----- Pod A- Can you please schedule follow-up with this patient with Pod A APP or Dr. Albertus.  Thank you  Deanna, NP ----- Message ----- From: [place] Aloha Raddle., MD Sent: 10/10/2023  10:30 AM EDT To: Gordy CHRISTELLA Albertus, MD; Cathryne PARAS May, NP  [facility], This patient's upper GI tract is very suggestive of gastroparesis, which looks like there is a history in the chart. Before I try to bring her back for another EUS, I think she needs to be optimized. I recommend she follow-up with you all in clinic and once you feel she is optimized, then we would put her on liquid diet the day prior to her procedure and reschedule her. She will come off my list for now. Let me know when you think she is ready/optimized for us  to try and get her back on the books. I will update you all if her esophagus biopsy showed Candida. GM

## 2023-10-14 ENCOUNTER — Ambulatory Visit: Payer: Self-pay | Admitting: Gastroenterology

## 2023-10-18 ENCOUNTER — Other Ambulatory Visit: Payer: Self-pay | Admitting: Family Medicine

## 2023-10-18 DIAGNOSIS — E1142 Type 2 diabetes mellitus with diabetic polyneuropathy: Secondary | ICD-10-CM

## 2023-10-20 ENCOUNTER — Ambulatory Visit
Admission: EM | Admit: 2023-10-20 | Discharge: 2023-10-20 | Disposition: A | Discharge status: Home / Self Care | Attending: Emergency Medicine | Admitting: Emergency Medicine

## 2023-10-20 ENCOUNTER — Other Ambulatory Visit: Payer: Self-pay

## 2023-10-20 DIAGNOSIS — B349 Viral infection, unspecified: Secondary | ICD-10-CM | POA: Diagnosis not present

## 2023-10-20 DIAGNOSIS — R519 Headache, unspecified: Secondary | ICD-10-CM

## 2023-10-20 LAB — POC SARS CORONAVIRUS 2 AG -  ED: SARS Coronavirus 2 Ag: NEGATIVE

## 2023-10-20 MED ORDER — PROMETHAZINE-DM 6.25-15 MG/5ML PO SYRP: 5.0000 mL | ORAL_SOLUTION | Freq: Four times a day (QID) | ORAL | 0 refills | Status: DC | PRN

## 2023-10-20 MED ORDER — ACETAMINOPHEN 500 MG PO TABS: 500.0000 mg | ORAL_TABLET | Freq: Four times a day (QID) | ORAL | 0 refills | Status: AC | PRN

## 2023-10-20 NOTE — Discharge Instructions (Signed)
 COVID-19 testing was negative, you most likely have a different viral illness.  Ensure you are staying well-hydrated with at least 64 ounces of water daily.  You can use the cough syrup as needed, this medication may cause drowsiness so do not drink alcohol or drive on it.  Take Tylenol  every 6 hours as needed for body aches, fever or chills.  Follow-up with your primary care provider regarding your type 2 diabetes and overall poor glucose control.  Seek immediate care at the nearest emergency department if your blood sugar is over 600 or you have any confusion, as these may be signs of a life-threatening condition called diabetic ketoacidosis.

## 2023-10-20 NOTE — ED Provider Notes (Signed)
 [facility]    CSN: [id number] Arrival date & time: 10/20/23  1337      History   Chief Complaint Chief Complaint  Patient presents with   Headache   Cough    HPI Jean [facility] is a 53 [facility].o. female.   Patient presents to clinic over concern of fatigue, generalized bodyaches, headache, rhinorrhea, congestion, and dry cough that started last night into this morning.  Unsure of fever, does not have a thermometer at home.  Has not tried any over-the-counter medications or interventions.  Endorses shortness of breath and wheezing with coughing.  Without history of asthma.  Does have a history of poorly controlled type 2 diabetes.  Reports her blood sugar is in the high 300s today.  The history is provided by the patient and medical records.  Headache Cough   Past Medical History:  Diagnosis Date   CAD (coronary artery disease)    Cervical radiculopathy 08/15/2017   CKD (chronic kidney disease), stage II    COVID-19 01/2022   DM (diabetes mellitus) (HCC)    HTN (hypertension)    Hyperlipidemia    Lumbar radiculopathy 06/21/2019   Last Assessment & Plan: Formatting of this note might be different from the original. Scheduled for laminectomy L5 4/27 Pre-op complete at [facility]; labs and ekg reviewed. Scheduled for cardiology eval in one day Medical cleared for procedure by IM evaluation.   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 Severe tendinosis of the supraspinatus tendon.  2. Mild tendinosis of the infraspinatus tendon.  3. Thickening of the inferior joint capsule and intermediate signal  material effacing the normal subcoracoid fat as can be seen with  adhesive capsulitis.    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    Patient Active Problem List   Diagnosis Date Noted   Mucosal abnormality of esophagus 10/10/2023    Dysphagia 10/10/2023   Chronic pancreatitis (HCC) 05/13/2023   Elevated LFTs 05/13/2023   Common bile duct dilation 05/13/2023   Chronic midline low back pain 07/19/2022   History of lumbar fusion 07/19/2022   Chronic left SI joint pain 07/19/2022   Numbness and tingling of left leg 07/19/2022   Chronic pain syndrome 07/19/2022   Uncontrolled type 1 diabetes mellitus with hyperglycemia, with long-term current use of insulin  (HCC) 06/23/2022   CKD stage G3b/A3, GFR 30-44 and albumin creatinine ratio >300 mg/g (HCC) 06/06/2022   Transaminitis 06/06/2022   Acute pancreatitis 06/06/2022   Ulnar neuropathy of both upper extremities 12/23/2021   Hyperlipidemia 10/01/2021   Diabetic neuropathy (HCC) 09/30/2021   Meralgia paresthetica, right lower limb 09/30/2021   Gait instability 07/30/2021   Migraine without status migrainosus, not intractable 07/30/2021   Poorly controlled type 2 diabetes mellitus with peripheral neuropathy (HCC) 06/05/2021   Menopausal symptoms 04/15/2021   Anxiety 03/25/2021   CAD (coronary artery disease)    HTN (hypertension)    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   Heart valve disease 01/06/2021   Vitamin B12 deficiency 12/03/2020   Vitamin D deficiency 12/03/2020   Chronic left shoulder pain 11/25/2020   Generalized anxiety disorder 11/25/2020   Psoriasis 01/01/2020   Lumbar radiculopathy 06/21/2019   Uterine leiomyoma 03/06/2019   Depression with anxiety 02/02/2018   Primary insomnia 02/02/2018   Class 2 severe obesity due to excess calories with serious comorbidity and body mass index (BMI) of 38.0 to  38.9 in adult [facility]) 10/12/2017   Insulin  pump in place 10/12/2017   Cervical radiculopathy 08/15/2017   Family history of early CAD 07/21/2017   Obstructive sleep apnea syndrome 07/21/2017   Chronic right shoulder pain 05/30/2017   Type 2 diabetes mellitus with mild nonproliferative diabetic retinopathy without macular edema, bilateral (HCC)  03/18/2017   Congestive heart failure (HCC) 03/16/2017   Other cervical disc degeneration, unspecified cervical region 11/11/2016   Adhesive capsulitis of right shoulder 10/08/2016   GERD (gastroesophageal reflux disease) 07/19/2016   Spinal stenosis of cervical region 12/01/2015   Cervical disc herniation 12/01/2015   Bilateral carpal tunnel syndrome 11/04/2015   Lesion of ulnar nerve, left upper limb 11/04/2015   Stented coronary artery 07/07/2015    Past Surgical History:  Procedure Laterality Date   APPENDECTOMY     BACK SURGERY     CESAREAN SECTION     CORONARY ANGIOPLASTY WITH STENT PLACEMENT     ESOPHAGOGASTRODUODENOSCOPY [facility]   Procedure: EGD (ESOPHAGOGASTRODUODENOSCOPY);  Surgeon: [facility];  Location: [facility];  Service: Gastroenterology;  Laterality: Jean;   MULTIPLE TOOTH EXTRACTIONS     ROTATOR CUFF REPAIR      OB History   No obstetric history on file.      Home Medications    Prior to Admission medications   Medication Sig Start Date End Date Taking? Authorizing Provider  acetaminophen  (TYLENOL ) 500 MG tablet Take 1 tablet (500 mg total) by mouth every 6 (six) hours as needed. 10/20/23  Yes Jean Siharath  [facility], Jean [facility]  promethazine -dextromethorphan (PROMETHAZINE -DM) 6.25-15 MG/5ML syrup Take 5 mLs by mouth 4 (four) times daily as needed for cough. 10/20/23  Yes Jean Rosas  [facility], Jean [facility]  aspirin  EC 81 MG tablet Take 81 mg by mouth daily.    Provider, Historical, [facility]  atorvastatin  (LIPITOR) 40 MG tablet TAKE 1 TABLET(40 MG) BY MOUTH DAILY 04/01/23   Jean [facility]  azelastine (OPTIVAR) 0.05 % ophthalmic solution Apply 1 drop to eye daily. 06/26/23   Provider, Historical, [facility]  cetirizine (ZYRTEC) 10 MG tablet Take 10 mg by mouth. 03/21/23   Provider, Historical, [facility]  clopidogrel  (PLAVIX ) 75 MG tablet Take 1 tablet (75 mg total) by mouth daily. 09/19/23   [facility]  Continuous Blood Gluc Receiver (DEXCOM G7 RECEIVER) DEVI Check  sugars daily Dx E11.9 04/05/22   [facility]  Continuous Blood Gluc Sensor (DEXCOM G7 SENSOR) MISC Check sugars daily Dx E11.9 04/05/22   [facility]  Continuous Glucose Transmitter (DEXCOM G6 TRANSMITTER) MISC  07/01/22   Provider, Historical, [facility]  Crisaborole (EUCRISA) 2 % OINT Apply 1 Application topically 2 (two) times daily. 06/11/20   Provider, Historical, [facility]  cyclobenzaprine  (FLEXERIL ) 10 MG tablet Take 5-10 mg by mouth. 06/29/23   Provider, Historical, [facility]  Estradiol -Norethindrone  Acet 0.5-0.1 MG tablet Take 1 tablet by mouth daily. 12/06/22   [facility]  fexofenadine  (ALLEGRA ) 180 MG tablet Take 1 tablet (180 mg total) by mouth daily. 05/06/22   Rucker, Torrence GRADE, [facility]  glipiZIDE (GLUCOTROL XL) 5 MG 24 hr tablet Take 5 mg by mouth daily with breakfast.    Provider, Historical, [facility]  HUMALOG  100 UNIT/ML injection INJECT 25 UNITS INTO THE SKIN 3 TIMES DAILY BEFORE MEALS. USE INSULIN  PUMP 05/12/22   [facility]  hydrOXYzine  (ATARAX ) 25 MG tablet Take 1 tablet (25 mg total) by mouth every 8 (eight) hours as needed. 02/14/23   [facility]  Insulin  Aspart, w/Niacinamide, (FIASP ) 100 UNIT/ML SOLN To be used with insulin  pump. 07/08/22   Provider, Historical, [facility]  Insulin  Disposable Pump (OMNIPOD 5 G6 PODS, GEN 5,) MISC CHANGE POD EVERY OTHER DAY. 06/07/22   [facility]  isosorbide  mononitrate (IMDUR ) 30 MG 24 hr tablet TAKE 1 TABLET(30 MG) BY MOUTH DAILY 02/21/23   [facility]  JARDIANCE  25 MG TABS tablet Take 1 tablet (25 mg total) by mouth daily. 06/15/22   Jean [facility]  ketoconazole  (NIZORAL ) 2 % shampoo Apply to affected areas 3 x a week for 8 weeks 11/18/22   [facility]  metoprolol  succinate (TOPROL -XL) 100 MG 24 hr tablet Take 1 tablet (100 mg total) by mouth daily. 11/12/22   [facility]  mometasone  (NASONEX ) 50 MCG/ACT nasal spray One spray in each nostril twice a day, use left hand for right nostril, and right hand  for left nostril.  Please dispense one bottle. 05/24/22   [facility]  montelukast  (SINGULAIR ) 10 MG tablet TAKE 1 TABLET(10 MG) BY MOUTH AT BEDTIME 04/25/23   Rucker, Torrence GRADE, [facility]  nitroGLYCERIN  (NITROSTAT ) 0.4 MG SL tablet Place 1 tablet (0.4 mg total) under the tongue every 5 (five) minutes as needed for chest pain. 11/12/22   Jean [facility]  nystatin  ointment (MYCOSTATIN ) Apply 1 Application topically 2 (two) times daily. 07/22/22   [facility]  olopatadine  (PATADAY ) 0.1 % ophthalmic solution Place 1 drop into both eyes 2 (two) times daily. 05/24/22   [facility]  pantoprazole  (PROTONIX ) 40 MG tablet Take 1 tablet (40 mg total) by mouth 2 (two) times daily. 05/04/23   May, Deanna J, NP  predniSONE  (DELTASONE ) 10 MG tablet Take 4 tablets (40 mg total) by mouth daily. 09/07/23   Zackowski, Scott, [facility]  pregabalin  (LYRICA ) 200 MG capsule TAKE 1 CAPSULE BY MOUTH TWICE DAILY 11/09/22   Rucker, Torrence GRADE, [facility]  sucralfate  (CARAFATE ) 1 g tablet TAKE 1 TABLET(1 GRAM) BY MOUTH FOUR TIMES DAILY AT BEDTIME WITH MEALS 05/09/23   Rucker, Torrence GRADE, [facility]  topiramate  (TOPAMAX ) 100 MG tablet Take by mouth 2 (two) times daily. 08/17/21   Provider, Historical, [facility]  traZODone  (DESYREL ) 100 MG tablet TAKE 1 TABLET(100 MG) BY MOUTH AT BEDTIME AS NEEDED FOR SLEEP 02/14/23   [facility]    Family History Family History  Problem Relation Age of Onset   Heart disease Mother    Heart disease Sister     Social History Social History   Tobacco Use   Smoking status: Former    Types: Cigarettes   Smokeless tobacco: Never  Vaping Use   Vaping status: Never Used  Substance Use Topics   Alcohol use: Never   Drug use: Never     Allergies   Tramadol, Nsaids, Sulfamethoxazole-trimethoprim, and Ibuprofen   Review of Systems Review of Systems  Per HPI  Physical Exam Triage Vital Signs ED Triage Vitals  Encounter Vitals Group     BP 10/20/23 1412 110/66     Girls Systolic  BP Percentile --      Girls Diastolic BP Percentile --      Boys Systolic BP Percentile --      Boys Diastolic BP Percentile --      Pulse Rate 10/20/23 1412 79     Resp 10/20/23 1412 18     Temp 10/20/23 1412 99.2 F (37.3 C)     Temp  Source 10/20/23 1412 Oral     SpO2 10/20/23 1412 96 %     Weight --      Height --      Head Circumference --      Peak Flow --      Pain Score 10/20/23 1415 10     Pain Loc --      Pain Education --      Exclude from Growth Chart --    No data found.  Updated Vital Signs BP 110/66   Pulse 79   Temp 99.2 F (37.3 C) (Oral)   Resp 18   SpO2 96%   Visual Acuity Right Eye Distance:   Left Eye Distance:   Bilateral Distance:    Right Eye Near:   Left Eye Near:    Bilateral Near:     Physical Exam Vitals and nursing note reviewed.  Constitutional:      Appearance: Normal appearance.  HENT:     Head: Normocephalic and atraumatic.     Right Ear: External ear normal.     Left Ear: External ear normal.     Nose: Congestion and rhinorrhea present.     Mouth/Throat:     Mouth: Mucous membranes are moist.     Pharynx: Posterior oropharyngeal erythema present.  Eyes:     Conjunctiva/sclera: Conjunctivae normal.  Cardiovascular:     Rate and Rhythm: Normal rate and regular rhythm.     Heart sounds: Normal heart sounds. No murmur heard. Pulmonary:     Effort: Pulmonary effort is normal. No respiratory distress.     Breath sounds: Normal breath sounds. No wheezing.  Skin:    General: Skin is warm and dry.  Neurological:     General: No focal deficit present.     Mental Status: She is alert and oriented to person, place, and time.  Psychiatric:        Mood and Affect: Mood normal.        Behavior: Behavior normal.      UC Treatments / Results  Labs (all labs ordered are listed, but only abnormal results are displayed) Labs Reviewed  POC SARS CORONAVIRUS 2 AG -  ED    EKG   Radiology No results  found.  Procedures Procedures (including critical care time)  Medications Ordered in UC Medications - No data to display  Initial Impression / Assessment and Plan / UC Course  I have reviewed the triage vital signs and the nursing notes.  Pertinent labs & imaging results that were available during my care of the patient were reviewed by me and considered in my medical decision making (see chart for details).  Vitals and triage reviewed, patient is hemodynamically stable.  Lungs vesicular, heart with regular rate and rhythm.  Congestion, rhinorrhea and postnasal drip present on physical exam.  POC COVID-19 testing negative, suspect other viral illness.  Symptomatic management discussed.  Plan of care, follow-up care return precautions given, no questions at this time.     Final Clinical Impressions(s) / UC Diagnoses   Final diagnoses:  Bad headache  Viral syndrome     Discharge Instructions      COVID-19 testing was negative, you most likely have a different viral illness.  Ensure you are staying well-hydrated with at least 64 ounces of water daily.  You can use the cough syrup as needed, this medication may cause drowsiness so do not drink alcohol or drive on it.  Take Tylenol  every 6 hours as needed  for body aches, fever or chills.  Follow-up with your primary care provider regarding your type 2 diabetes and overall poor glucose control.  Seek immediate care at the nearest emergency department if your blood sugar is over 600 or you have any confusion, as these may be signs of a life-threatening condition called diabetic ketoacidosis.     ED Prescriptions     Medication Sig Dispense Auth. Provider   acetaminophen  (TYLENOL ) 500 MG tablet Take 1 tablet (500 mg total) by mouth every 6 (six) hours as needed. 30 tablet Jean, Jean Mall  [facility], Jean [facility]   promethazine -dextromethorphan (PROMETHAZINE -DM) 6.25-15 MG/5ML syrup Take 5 mLs by mouth 4 (four) times daily as needed for cough. 118  mL Jean, Jean Laye  [facility], Jean [facility]      PDMP not reviewed this encounter.   Jean Jefferson SAILOR, Jean [facility] 10/20/23 1453

## 2023-10-20 NOTE — ED Triage Notes (Signed)
 Has c/o ha, rhinorrhea, cough since this morning. Unsure of fever. No otc meds.

## 2023-10-27 ENCOUNTER — Ambulatory Visit
Admission: EM | Admit: 2023-10-27 | Discharge: 2023-10-27 | Disposition: A | Discharge status: Home / Self Care | Attending: Family Medicine | Admitting: Family Medicine

## 2023-10-27 ENCOUNTER — Other Ambulatory Visit: Payer: Self-pay | Admitting: Family Medicine

## 2023-10-27 DIAGNOSIS — R059 Cough, unspecified: Secondary | ICD-10-CM | POA: Diagnosis not present

## 2023-10-27 DIAGNOSIS — J309 Allergic rhinitis, unspecified: Secondary | ICD-10-CM | POA: Diagnosis not present

## 2023-10-27 DIAGNOSIS — J0100 Acute maxillary sinusitis, unspecified: Secondary | ICD-10-CM

## 2023-10-27 DIAGNOSIS — I509 Heart failure, unspecified: Secondary | ICD-10-CM

## 2023-10-27 MED ORDER — HYDROCODONE BIT-HOMATROP MBR 5-1.5 MG/5ML PO SOLN: 5.0000 mL | Freq: Four times a day (QID) | ORAL | 0 refills | Status: DC | PRN

## 2023-10-27 MED ORDER — FEXOFENADINE HCL 180 MG PO TABS: 180.0000 mg | ORAL_TABLET | Freq: Every day | ORAL | 0 refills | Status: DC

## 2023-10-27 MED ORDER — AMOXICILLIN-POT CLAVULANATE 875-125 MG PO TABS: 1.0000 | ORAL_TABLET | Freq: Two times a day (BID) | ORAL | 0 refills | Status: DC

## 2023-10-27 NOTE — ED Triage Notes (Signed)
 Pt presents to [facility] with co sinus pain and congestion with yellow dicharge and cough since 8/21 pt was seen here and given cough medication symptoms have not improved since.

## 2023-10-27 NOTE — ED Provider Notes (Signed)
 [facility]    CSN: [id number] Arrival date & time: 10/27/23  1423      History   Chief Complaint Chief Complaint  Patient presents with   Cough   Facial Pain    Facial congestion      HPI Jean Calderon is a 53 y.o. female.   HPI Pleasant 53 year old female presents with sinus issues.  Patient reports was evaluated here on 8/21 for viral syndrome please see epic for that encounter note.  PMH significant for CAD (s/p MI), morbid obesity, T2DM, and HTN.  Past Medical History:  Diagnosis Date   CAD (coronary artery disease)    Cervical radiculopathy 08/15/2017   CKD (chronic kidney disease), stage II    COVID-19 01/2022   DM (diabetes mellitus) (HCC)    HTN (hypertension)    Hyperlipidemia    Lumbar radiculopathy 06/21/2019   Last Assessment & Plan: Formatting of this note might be different from the original. Scheduled for laminectomy L5 4/27 Pre-op complete at hospital; labs and ekg reviewed. Scheduled for cardiology eval in one day Medical cleared for procedure by IM evaluation.   Myocardial infarction [facility])    Neuropathy    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   1. Severe tendinosis of the supraspinatus tendon.  2. Mild tendinosis of the infraspinatus tendon.  3. Thickening of the inferior joint capsule and intermediate signal  material effacing the normal subcoracoid fat as can be seen with  adhesive capsulitis.    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    Patient Active Problem List   Diagnosis Date Noted   Mucosal abnormality of esophagus 10/10/2023   Dysphagia 10/10/2023   Chronic pancreatitis (HCC) 05/13/2023   Elevated LFTs 05/13/2023   Common bile duct dilation 05/13/2023   Chronic midline low back pain 07/19/2022   History of lumbar fusion 07/19/2022   Chronic left SI joint pain 07/19/2022   Numbness and tingling of left leg 07/19/2022    Chronic pain syndrome 07/19/2022   Uncontrolled type 1 diabetes mellitus with hyperglycemia, with long-term current use of insulin  (HCC) 06/23/2022   CKD stage G3b/A3, GFR 30-44 and albumin creatinine ratio >300 mg/g (HCC) 06/06/2022   Transaminitis 06/06/2022   Acute pancreatitis 06/06/2022   Ulnar neuropathy of both upper extremities 12/23/2021   Hyperlipidemia 10/01/2021   Diabetic neuropathy (HCC) 09/30/2021   Meralgia paresthetica, right lower limb 09/30/2021   Gait instability 07/30/2021   Migraine without status migrainosus, not intractable 07/30/2021   Poorly controlled type 2 diabetes mellitus with peripheral neuropathy (HCC) 06/05/2021   Menopausal symptoms 04/15/2021   Anxiety 03/25/2021   CAD (coronary artery disease)    HTN (hypertension)    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   Heart valve disease 01/06/2021   Vitamin B12 deficiency 12/03/2020   Vitamin D deficiency 12/03/2020   Chronic left shoulder pain 11/25/2020   Generalized anxiety disorder 11/25/2020   Psoriasis 01/01/2020   Lumbar radiculopathy 06/21/2019   Uterine leiomyoma 03/06/2019   Depression with anxiety 02/02/2018   Primary insomnia 02/02/2018   Class 2 severe obesity due to excess calories with serious comorbidity and body mass index (BMI) of 38.0 to 38.9 in adult [facility]) 10/12/2017   Insulin  pump in place 10/12/2017   Cervical radiculopathy 08/15/2017   Family history of early CAD 07/21/2017   Obstructive sleep apnea syndrome 07/21/2017   Chronic right shoulder pain 05/30/2017   Type 2 diabetes mellitus 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, bilateral (HCC) 03/18/2017   Congestive heart failure (HCC) 03/16/2017   Other cervical disc degeneration, unspecified cervical region 11/11/2016   Adhesive capsulitis of right shoulder 10/08/2016   GERD (gastroesophageal reflux disease) 07/19/2016   Spinal stenosis of cervical region 12/01/2015   Cervical disc herniation  12/01/2015   Bilateral carpal tunnel syndrome 11/04/2015   Lesion of ulnar nerve, left upper limb 11/04/2015   Stented coronary artery 07/07/2015    Past Surgical History:  Procedure Laterality Date   APPENDECTOMY     BACK SURGERY     CESAREAN SECTION     CORONARY ANGIOPLASTY WITH STENT PLACEMENT     ESOPHAGOGASTRODUODENOSCOPY N/A 10/10/2023   Procedure: EGD (ESOPHAGOGASTRODUODENOSCOPY);  Surgeon: Wilhelmenia Aloha Raddle., MD;  Location: [facility];  Service: Gastroenterology;  Laterality: N/A;   MULTIPLE TOOTH EXTRACTIONS     ROTATOR CUFF REPAIR      OB History   No obstetric history on file.      Home Medications    Prior to Admission medications   Medication Sig Start Date End Date Taking? Authorizing Provider  amoxicillin -clavulanate (AUGMENTIN ) 875-125 MG tablet Take 1 tablet by mouth every 12 (twelve) hours. 10/27/23  Yes Teddy Sharper, FNP  fexofenadine  (ALLEGRA  ALLERGY) 180 MG tablet Take 1 tablet (180 mg total) by mouth daily for 15 days. 10/27/23 11/11/23 Yes Teddy Sharper, FNP  HYDROcodone  bit-homatropine (HYCODAN) 5-1.5 MG/5ML syrup Take 5 mLs by mouth every 6 (six) hours as needed for cough. 10/27/23  Yes Teddy Sharper, FNP  acetaminophen  (TYLENOL ) 500 MG tablet Take 1 tablet (500 mg total) by mouth every 6 (six) hours as needed. 10/20/23   Dreama, [place]  N, FNP  aspirin  EC 81 MG tablet Take 81 mg by mouth daily.    [provider]  atorvastatin  (LIPITOR) 40 MG tablet TAKE 1 TABLET(40 MG) BY MOUTH DAILY 04/01/23   Rucker, Alethea Y, MD  azelastine (OPTIVAR) 0.05 % ophthalmic solution Apply 1 drop to eye daily. 06/26/23   [provider]  clopidogrel  (PLAVIX ) 75 MG tablet Take 1 tablet (75 mg total) by mouth daily. 09/19/23   Pietro Redell RAMAN, MD  Continuous Blood Gluc Receiver (DEXCOM G7 RECEIVER) DEVI Check sugars daily Dx E11.9 04/05/22   Colette Torrence GRADE, MD  Continuous Blood Gluc Sensor (DEXCOM G7 SENSOR) MISC Check sugars daily Dx E11.9 04/05/22    Colette Torrence GRADE, MD  Continuous Glucose Transmitter (DEXCOM G6 TRANSMITTER) MISC  07/01/22   [provider]  Crisaborole (EUCRISA) 2 % OINT Apply 1 Application topically 2 (two) times daily. 06/11/20   [provider]  cyclobenzaprine  (FLEXERIL ) 10 MG tablet Take 5-10 mg by mouth. 06/29/23   [provider]  Estradiol -Norethindrone  Acet 0.5-0.1 MG tablet Take 1 tablet by mouth daily. 12/06/22   Rucker, Torrence GRADE, MD  glipiZIDE (GLUCOTROL XL) 5 MG 24 hr tablet Take 5 mg by mouth daily with breakfast.    [provider]  HUMALOG  100 UNIT/ML injection INJECT 25 UNITS INTO THE SKIN 3 TIMES DAILY BEFORE MEALS. USE INSULIN  PUMP 05/12/22   Colette Torrence GRADE, MD  hydrOXYzine  (ATARAX ) 25 MG tablet Take 1 tablet (25 mg total) by mouth every 8 (eight) hours as needed. 02/14/23   Colette Torrence GRADE, MD  Insulin  Aspart, w/Niacinamide, (FIASP ) 100 UNIT/ML SOLN To be used with insulin  pump. 07/08/22   [provider]  Insulin  Disposable Pump (OMNIPOD 5 G6 PODS, GEN 5,) MISC CHANGE POD EVERY OTHER DAY. 06/07/22   Rucker, Alethea  Y, MD  isosorbide  mononitrate (IMDUR ) 30 MG 24 hr tablet TAKE 1 TABLET(30 MG) BY MOUTH DAILY 02/21/23   Colette Torrence GRADE, MD  JARDIANCE  25 MG TABS tablet Take 1 tablet (25 mg total) by mouth daily. 06/15/22   Colette Torrence GRADE, MD  ketoconazole  (NIZORAL ) 2 % shampoo Apply to affected areas 3 x a week for 8 weeks 11/18/22   Colette Torrence GRADE, MD  metoprolol  succinate (TOPROL -XL) 100 MG 24 hr tablet Take 1 tablet (100 mg total) by mouth daily. 11/12/22   Pietro Redell RAMAN, MD  mometasone  (NASONEX ) 50 MCG/ACT nasal spray One spray in each nostril twice a day, use left hand for right nostril, and right hand for left nostril.  Please dispense one bottle. 05/24/22   Colette Torrence GRADE, MD  montelukast  (SINGULAIR ) 10 MG tablet TAKE 1 TABLET(10 MG) BY MOUTH AT BEDTIME 04/25/23   Rucker, Torrence GRADE, MD  nitroGLYCERIN  (NITROSTAT ) 0.4 MG SL tablet Place 1 tablet (0.4 mg  total) under the tongue every 5 (five) minutes as needed for chest pain. 11/12/22   Pietro Redell RAMAN, MD  nystatin  ointment (MYCOSTATIN ) Apply 1 Application topically 2 (two) times daily. 07/22/22   Colette Torrence GRADE, MD  olopatadine  (PATADAY ) 0.1 % ophthalmic solution Place 1 drop into both eyes 2 (two) times daily. 05/24/22   Colette Torrence GRADE, MD  pantoprazole  (PROTONIX ) 40 MG tablet Take 1 tablet (40 mg total) by mouth 2 (two) times daily. 05/04/23   May, Deanna J, NP  pregabalin  (LYRICA ) 200 MG capsule TAKE 1 CAPSULE BY MOUTH TWICE DAILY 11/09/22   Rucker, Torrence GRADE, MD  sucralfate  (CARAFATE ) 1 g tablet TAKE 1 TABLET(1 GRAM) BY MOUTH FOUR TIMES DAILY AT BEDTIME WITH MEALS 05/09/23   Colette Torrence GRADE, MD  topiramate  (TOPAMAX ) 100 MG tablet Take by mouth 2 (two) times daily. 08/17/21   [provider]  traZODone  (DESYREL ) 100 MG tablet TAKE 1 TABLET(100 MG) BY MOUTH AT BEDTIME AS NEEDED FOR SLEEP 02/14/23   Colette Torrence GRADE, MD    Family History Family History  Problem Relation Age of Onset   Heart disease Mother    Heart disease Sister     Social History Social History   Tobacco Use   Smoking status: Former    Types: Cigarettes   Smokeless tobacco: Never  Vaping Use   Vaping status: Never Used  Substance Use Topics   Alcohol use: Never   Drug use: Never     Allergies   Tramadol, Nsaids, Sulfamethoxazole-trimethoprim, and Ibuprofen   Review of Systems Review of Systems   Physical Exam Triage Vital Signs ED Triage Vitals  Encounter Vitals Group     BP      Girls Systolic BP Percentile      Girls Diastolic BP Percentile      Boys Systolic BP Percentile      Boys Diastolic BP Percentile      Pulse      Resp      Temp      Temp src      SpO2      Weight      Height      Head Circumference      Peak Flow      Pain Score      Pain Loc      Pain Education      Exclude from Growth Chart    No data found.  Updated Vital Signs BP (!) 142/82   Pulse 73  Temp 98.1 F (36.7 C)   Resp 19   SpO2 98%   Visual Acuity Right Eye Distance:   Left Eye Distance:   Bilateral Distance:    Right Eye Near:   Left Eye Near:    Bilateral Near:     Physical Exam Vitals and nursing note reviewed.  Constitutional:      Appearance: Normal appearance. She is obese. She is ill-appearing.  HENT:     Head: Normocephalic and atraumatic.     Right Ear: Tympanic membrane and external ear normal.     Left Ear: Tympanic membrane and external ear normal.     Ears:     Comments: Significant eustachian tube dysfunction noted bilaterally    Nose:     Right Turbinates: Enlarged.     Left Turbinates: Enlarged.     Right Sinus: Maxillary sinus tenderness present.     Left Sinus: Maxillary sinus tenderness present.     Comments: Turbinates are erythematous/edematous    Mouth/Throat:     Mouth: Mucous membranes are moist.     Pharynx: Oropharynx is clear.     Comments: Significant amount of clear drainage of posterior oropharynx noted Eyes:     Extraocular Movements: Extraocular movements intact.     Conjunctiva/sclera: Conjunctivae normal.     Pupils: Pupils are equal, round, and reactive to light.  Cardiovascular:     Rate and Rhythm: Normal rate and regular rhythm.     Pulses: Normal pulses.     Heart sounds: Normal heart sounds.  Pulmonary:     Effort: Pulmonary effort is normal.     Breath sounds: Normal breath sounds. No wheezing, rhonchi or rales.     Comments: Frequent nonproductive cough on exam Musculoskeletal:        General: Normal range of motion.  Skin:    General: Skin is warm and dry.  Neurological:     General: No focal deficit present.     Mental Status: She is alert and oriented to person, place, and time. Mental status is at baseline.  Psychiatric:        Mood and Affect: Mood normal.        Behavior: Behavior normal.      UC Treatments / Results  Labs (all labs ordered are listed, but only abnormal results are  displayed) Labs Reviewed - No data to display  EKG   Radiology No results found.  Procedures Procedures (including critical care time)  Medications Ordered in UC Medications - No data to display  Initial Impression / Assessment and Plan / UC Course  I have reviewed the triage vital signs and the nursing notes.  Pertinent labs & imaging results that were available during my care of the patient were reviewed by me and considered in my medical decision making (see chart for details).     MDM: 1.  Acute maxillary sinusitis, recurrence not specified-Rx'd Augmentin  875/125 mg tablet: Take 1 tablet twice daily x 7 days; 2.  Cough, unspecified type-Rx'd Hycodan 5-1.5 mg / 5 mL syrup: Take 5 mL every 6 hours, as needed for cough; 3.  Allergic rhinitis, unspecified seasonality, unspecified trigger-Rx'd Allegra  180 mg tablet: Take 1 tablet daily x 5 days, then as needed. Advised patient to take medications as directed with food to completion.  Advised patient to take Allegra  with first dose of Augmentin  for the next 5 of 7 days.  Advised patient may use Hycodan cough syrup at night prior to sleep for cough due  to sedative effects.  Encouraged increase daily water intake to 64 ounces per day while taking these medications.  Advised if symptoms worsen and/or unresolved please follow-up with PCP or here for further evaluation.  Patient discharged home, hemodynamically stable. Final Clinical Impressions(s) / UC Diagnoses   Final diagnoses:  Cough, unspecified type  Acute maxillary sinusitis, recurrence not specified  Allergic rhinitis, unspecified seasonality, unspecified trigger     Discharge Instructions      Advised patient to take medications as directed with food to completion.  Advised patient to take Allegra  with first dose of Augmentin  for the next 5 of 7 days.  Advised patient may use Hycodan cough syrup at night prior to sleep for cough due to sedative effects.  Encouraged increase  daily water intake to 64 ounces per day while taking these medications.  Advised if symptoms worsen and/or unresolved please follow-up with PCP or here for further evaluation.     ED Prescriptions     Medication Sig Dispense Auth. Provider   amoxicillin -clavulanate (AUGMENTIN ) 875-125 MG tablet Take 1 tablet by mouth every 12 (twelve) hours. 14 tablet Alarik Radu, FNP   fexofenadine  (ALLEGRA  ALLERGY) 180 MG tablet Take 1 tablet (180 mg total) by mouth daily for 15 days. 15 tablet Hermelinda Diegel, FNP   HYDROcodone  bit-homatropine (HYCODAN) 5-1.5 MG/5ML syrup Take 5 mLs by mouth every 6 (six) hours as needed for cough. 120 mL Teddy Sharper, FNP      I have reviewed the PDMP during this encounter.   Teddy Sharper, FNP 10/27/23 1511

## 2023-10-27 NOTE — Discharge Instructions (Addendum)
 Advised patient to take medications as directed with food to completion.  Advised patient to take Allegra  with first dose of Augmentin  for the next 5 of 7 days.  Advised patient may use Hycodan cough syrup at night prior to sleep for cough due to sedative effects.  Encouraged increase daily water intake to 64 ounces per day while taking these medications.  Advised if symptoms worsen and/or unresolved please follow-up with PCP or here for further evaluation.

## 2023-10-28 ENCOUNTER — Telehealth: Payer: Self-pay

## 2023-10-28 ENCOUNTER — Ambulatory Visit (INDEPENDENT_AMBULATORY_CARE_PROVIDER_SITE_OTHER): Admitting: Podiatry

## 2023-10-28 DIAGNOSIS — Z91199 Patient's noncompliance with other medical treatment and regimen due to unspecified reason: Secondary | ICD-10-CM

## 2023-10-28 NOTE — Telephone Encounter (Signed)
Called to check on patient. Left voicemail.

## 2023-10-28 NOTE — Progress Notes (Signed)
 No show

## 2023-11-02 ENCOUNTER — Ambulatory Visit
Admission: EM | Admit: 2023-11-02 | Discharge: 2023-11-02 | Disposition: A | Discharge status: Home / Self Care | Attending: Family Medicine | Admitting: Family Medicine

## 2023-11-02 ENCOUNTER — Other Ambulatory Visit: Payer: Self-pay

## 2023-11-02 DIAGNOSIS — J209 Acute bronchitis, unspecified: Secondary | ICD-10-CM | POA: Diagnosis not present

## 2023-11-02 DIAGNOSIS — J069 Acute upper respiratory infection, unspecified: Secondary | ICD-10-CM | POA: Diagnosis not present

## 2023-11-02 MED ORDER — HYDROCOD POLI-CHLORPHE POLI ER 10-8 MG/5ML PO SUER: 5.0000 mL | Freq: Two times a day (BID) | ORAL | 0 refills | Status: DC | PRN

## 2023-11-02 MED ORDER — AZITHROMYCIN 250 MG PO TABS: ORAL_TABLET | ORAL | 0 refills | Status: DC

## 2023-11-02 NOTE — ED Provider Notes (Signed)
 [name]    CSN: [id number] Arrival date & time: 11/02/23  1535      History   Chief Complaint Chief Complaint  Patient presents with   Cough    HPI Jean Calderon is a 53 y.o. female.   Patient known to me from prior visits.  He is here today for cough.  This is her third visit since 10/20/2023.  She was prescribed Augmentin .  States she was unable to take it because it upset her stomach.  Only took 2 days.  Thought she was feeling a bit better than the cough came back.  The cough keeps her awake at night.  She is terribly tired.  Feels short of breath.  States she cannot take prednisone  because of her diabetes.  Prior smoker.  Denies emphysema or lung disease.    Past Medical History:  Diagnosis Date   CAD (coronary artery disease)    Cervical radiculopathy 08/15/2017   CKD (chronic kidney disease), stage II    COVID-19 01/2022   DM (diabetes mellitus) (HCC)    HTN (hypertension)    Hyperlipidemia    Lumbar radiculopathy 06/21/2019   Last Assessment & Plan: Formatting of this note might be different from the original. Scheduled for laminectomy L5 4/27 Pre-op complete at hospital; labs and ekg reviewed. Scheduled for cardiology eval in one day Medical cleared for procedure by IM evaluation.   Myocardial infarction [facility])    Neuropathy    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   1. Severe tendinosis of the supraspinatus tendon.  2. Mild tendinosis of the infraspinatus tendon.  3. Thickening of the inferior joint capsule and intermediate signal  material effacing the normal subcoracoid fat as can be seen with  adhesive capsulitis.    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    Patient Active Problem List   Diagnosis Date Noted   Mucosal abnormality of esophagus 10/10/2023   Dysphagia 10/10/2023   Chronic pancreatitis (HCC) 05/13/2023   Elevated LFTs 05/13/2023    Common bile duct dilation 05/13/2023   Chronic midline low back pain 07/19/2022   History of lumbar fusion 07/19/2022   Chronic left SI joint pain 07/19/2022   Numbness and tingling of left leg 07/19/2022   Chronic pain syndrome 07/19/2022   Uncontrolled type 1 diabetes mellitus with hyperglycemia, with long-term current use of insulin  (HCC) 06/23/2022   CKD stage G3b/A3, GFR 30-44 and albumin creatinine ratio >300 mg/g (HCC) 06/06/2022   Transaminitis 06/06/2022   Acute pancreatitis 06/06/2022   Ulnar neuropathy of both upper extremities 12/23/2021   Hyperlipidemia 10/01/2021   Diabetic neuropathy (HCC) 09/30/2021   Meralgia paresthetica, right lower limb 09/30/2021   Gait instability 07/30/2021   Migraine without status migrainosus, not intractable 07/30/2021   Poorly controlled type 2 diabetes mellitus with peripheral neuropathy (HCC) 06/05/2021   Menopausal symptoms 04/15/2021   Anxiety 03/25/2021   CAD (coronary artery disease)    HTN (hypertension)    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   Heart valve disease 01/06/2021   Vitamin B12 deficiency 12/03/2020   Vitamin D deficiency 12/03/2020   Chronic left shoulder pain 11/25/2020   Generalized anxiety disorder 11/25/2020   Psoriasis 01/01/2020   Lumbar radiculopathy 06/21/2019   Uterine leiomyoma 03/06/2019   Depression with anxiety 02/02/2018   Primary insomnia 02/02/2018   Class 2 severe obesity due to excess calories with serious comorbidity and body mass index (BMI) of 38.0 to  38.9 in adult [facility]) 10/12/2017   Insulin  pump in place 10/12/2017   Cervical radiculopathy 08/15/2017   Family history of early CAD 07/21/2017   Obstructive sleep apnea syndrome 07/21/2017   Chronic right shoulder pain 05/30/2017   Type 2 diabetes mellitus with mild nonproliferative diabetic retinopathy without macular edema, bilateral (HCC) 03/18/2017   Congestive heart failure (HCC) 03/16/2017   Other cervical disc degeneration,  unspecified cervical region 11/11/2016   Adhesive capsulitis of right shoulder 10/08/2016   GERD (gastroesophageal reflux disease) 07/19/2016   Spinal stenosis of cervical region 12/01/2015   Cervical disc herniation 12/01/2015   Bilateral carpal tunnel syndrome 11/04/2015   Lesion of ulnar nerve, left upper limb 11/04/2015   Stented coronary artery 07/07/2015    Past Surgical History:  Procedure Laterality Date   APPENDECTOMY     BACK SURGERY     CESAREAN SECTION     CORONARY ANGIOPLASTY WITH STENT PLACEMENT     ESOPHAGOGASTRODUODENOSCOPY N/A 10/10/2023   Procedure: EGD (ESOPHAGOGASTRODUODENOSCOPY);  Surgeon: Wilhelmenia Aloha Raddle., MD;  Location: [facility];  Service: Gastroenterology;  Laterality: N/A;   MULTIPLE TOOTH EXTRACTIONS     ROTATOR CUFF REPAIR      OB History   No obstetric history on file.      Home Medications    Prior to Admission medications   Medication Sig Start Date End Date Taking? Authorizing Provider  azithromycin  (ZITHROMAX  Z-PAK) 250 MG tablet Take two pills today followed by one a day until gone 11/02/23  Yes Maranda Jamee Jacob, MD  chlorpheniramine-HYDROcodone  (TUSSIONEX) 10-8 MG/5ML Take 5 mLs by mouth every 12 (twelve) hours as needed for cough. 11/02/23  Yes Maranda Jamee Jacob, MD  acetaminophen  (TYLENOL ) 500 MG tablet Take 1 tablet (500 mg total) by mouth every 6 (six) hours as needed. 10/20/23   Dreama, [place]  N, FNP  aspirin  EC 81 MG tablet Take 81 mg by mouth daily.    [provider]  atorvastatin  (LIPITOR) 40 MG tablet TAKE 1 TABLET(40 MG) BY MOUTH DAILY 04/01/23   Rucker, Alethea Y, MD  azelastine (OPTIVAR) 0.05 % ophthalmic solution Apply 1 drop to eye daily. 06/26/23   [provider]  clopidogrel  (PLAVIX ) 75 MG tablet Take 1 tablet (75 mg total) by mouth daily. 09/19/23   Pietro Redell RAMAN, MD  Continuous Blood Gluc Receiver (DEXCOM G7 RECEIVER) DEVI Check sugars daily Dx E11.9 04/05/22   Colette Torrence GRADE, MD  Continuous  Blood Gluc Sensor (DEXCOM G7 SENSOR) MISC Check sugars daily Dx E11.9 04/05/22   Colette Torrence GRADE, MD  Continuous Glucose Transmitter (DEXCOM G6 TRANSMITTER) MISC  07/01/22   [provider]  Crisaborole (EUCRISA) 2 % OINT Apply 1 Application topically 2 (two) times daily. 06/11/20   [provider]  cyclobenzaprine  (FLEXERIL ) 10 MG tablet Take 5-10 mg by mouth. 06/29/23   [provider]  Estradiol -Norethindrone  Acet 0.5-0.1 MG tablet Take 1 tablet by mouth daily. 12/06/22   Colette Torrence GRADE, MD  fexofenadine  (ALLEGRA  ALLERGY) 180 MG tablet Take 1 tablet (180 mg total) by mouth daily for 15 days. 10/27/23 11/11/23  Teddy Sharper, FNP  glipiZIDE (GLUCOTROL XL) 5 MG 24 hr tablet Take 5 mg by mouth daily with breakfast.    [provider]  HUMALOG  100 UNIT/ML injection INJECT 25 UNITS INTO THE SKIN 3 TIMES DAILY BEFORE MEALS. USE INSULIN  PUMP 05/12/22   Colette Torrence GRADE, MD  Insulin  Aspart, w/Niacinamide, (FIASP ) 100 UNIT/ML SOLN To be used with insulin  pump. 07/08/22  [provider]  Insulin  Disposable Pump (OMNIPOD 5 G6 PODS, GEN 5,) MISC CHANGE POD EVERY OTHER DAY. 06/07/22   Colette Torrence GRADE, MD  isosorbide  mononitrate (IMDUR ) 30 MG 24 hr tablet TAKE 1 TABLET(30 MG) BY MOUTH DAILY 02/21/23   Colette Torrence GRADE, MD  JARDIANCE  25 MG TABS tablet Take 1 tablet (25 mg total) by mouth daily. 06/15/22   Colette Torrence GRADE, MD  ketoconazole  (NIZORAL ) 2 % shampoo Apply to affected areas 3 x a week for 8 weeks 11/18/22   Colette Torrence GRADE, MD  metoprolol  succinate (TOPROL -XL) 100 MG 24 hr tablet Take 1 tablet (100 mg total) by mouth daily. 11/12/22   Pietro Redell RAMAN, MD  mometasone  (NASONEX ) 50 MCG/ACT nasal spray One spray in each nostril twice a day, use left hand for right nostril, and right hand for left nostril.  Please dispense one bottle. 05/24/22   Colette Torrence GRADE, MD  montelukast  (SINGULAIR ) 10 MG tablet TAKE 1 TABLET(10 MG) BY MOUTH AT BEDTIME 04/25/23   Rucker,  Torrence GRADE, MD  nitroGLYCERIN  (NITROSTAT ) 0.4 MG SL tablet Place 1 tablet (0.4 mg total) under the tongue every 5 (five) minutes as needed for chest pain. 11/12/22   Pietro Redell RAMAN, MD  nystatin  ointment (MYCOSTATIN ) Apply 1 Application topically 2 (two) times daily. 07/22/22   Colette Torrence GRADE, MD  olopatadine  (PATADAY ) 0.1 % ophthalmic solution Place 1 drop into both eyes 2 (two) times daily. 05/24/22   Colette Torrence GRADE, MD  pantoprazole  (PROTONIX ) 40 MG tablet Take 1 tablet (40 mg total) by mouth 2 (two) times daily. 05/04/23   May, Deanna J, NP  pregabalin  (LYRICA ) 200 MG capsule TAKE 1 CAPSULE BY MOUTH TWICE DAILY 11/09/22   Rucker, Torrence GRADE, MD  sucralfate  (CARAFATE ) 1 g tablet TAKE 1 TABLET(1 GRAM) BY MOUTH FOUR TIMES DAILY AT BEDTIME WITH MEALS 05/09/23   Rucker, Torrence GRADE, MD  topiramate  (TOPAMAX ) 100 MG tablet Take by mouth 2 (two) times daily. 08/17/21   [provider]  traZODone  (DESYREL ) 100 MG tablet TAKE 1 TABLET(100 MG) BY MOUTH AT BEDTIME AS NEEDED FOR SLEEP 02/14/23   Colette Torrence GRADE, MD    Family History Family History  Problem Relation Age of Onset   Heart disease Mother    Heart disease Sister     Social History Social History   Tobacco Use   Smoking status: Former    Types: Cigarettes   Smokeless tobacco: Never  Vaping Use   Vaping status: Never Used  Substance Use Topics   Alcohol use: Never   Drug use: Never     Allergies   Tramadol, Nsaids, Sulfamethoxazole-trimethoprim, and Ibuprofen   Review of Systems Review of Systems See HPI  Physical Exam Triage Vital Signs ED Triage Vitals  Encounter Vitals Group     BP 11/02/23 1542 130/78     Girls Systolic BP Percentile --      Girls Diastolic BP Percentile --      Boys Systolic BP Percentile --      Boys Diastolic BP Percentile --      Pulse Rate 11/02/23 1542 99     Resp 11/02/23 1542 20     Temp 11/02/23 1542 98.1 F (36.7 C)     Temp src --      SpO2 11/02/23 1542 95 %     Weight  --      Height --      Head Circumference --  Peak Flow --      Pain Score 11/02/23 1546 7     Pain Loc --      Pain Education --      Exclude from Growth Chart --    No data found.  Updated Vital Signs BP 130/78   Pulse 99   Temp 98.1 F (36.7 C)   Resp 20   SpO2 95%       Physical Exam Constitutional:      General: She is not in acute distress.    Appearance: She is well-developed. She is obese. She is ill-appearing.  HENT:     Head: Normocephalic and atraumatic.  Eyes:     Conjunctiva/sclera: Conjunctivae normal.     Pupils: Pupils are equal, round, and reactive to light.  Cardiovascular:     Rate and Rhythm: Normal rate.     Heart sounds: Normal heart sounds.  Pulmonary:     Effort: Pulmonary effort is normal. No respiratory distress.     Comments: Diminished breath sounds throughout Musculoskeletal:        General: Normal range of motion.     Cervical back: Normal range of motion.  Skin:    General: Skin is warm and dry.  Neurological:     Mental Status: She is alert.      UC Treatments / Results  Labs (all labs ordered are listed, but only abnormal results are displayed) Labs Reviewed - No data to display  EKG   Radiology No results found.  Procedures Procedures (including critical care time)  Medications Ordered in UC Medications - No data to display  Initial Impression / Assessment and Plan / UC Course  I have reviewed the triage vital signs and the nursing notes.  Pertinent labs & imaging results that were available during my care of the patient were reviewed by me and considered in my medical decision making (see chart for details).    Patient has had cough symptoms for 2 weeks.  This may be a postinflammatory cough syndrome, but because she was unable to tolerate her antibiotics will cover her with a Z-Pak. Final Clinical Impressions(s) / UC Diagnoses   Final diagnoses:  Viral URI with cough  Acute bronchitis, unspecified  organism     Discharge Instructions      Take the azithromycin  as directed.  You will take 2 pills today, then 1 a day until gone.  Take this antibiotic with food Make sure you are drinking lots of water Take the cough medicine 2 times a day as directed This can cause drowsiness so do not drive on the strong cough medicine Call for problems See your doctor if not improved by next week   ED Prescriptions     Medication Sig Dispense Auth. Provider   chlorpheniramine-HYDROcodone  (TUSSIONEX) 10-8 MG/5ML Take 5 mLs by mouth every 12 (twelve) hours as needed for cough. 115 mL Maranda Jamee Jacob, MD   azithromycin  (ZITHROMAX  Z-PAK) 250 MG tablet Take two pills today followed by one a day until gone 6 tablet Maranda Jamee Jacob, MD      I have reviewed the PDMP during this encounter.   Maranda Jamee Jacob, MD 11/02/23 [phone]

## 2023-11-02 NOTE — ED Triage Notes (Signed)
 Has c/o cough, congestion. Reports she was seen for same on 8/21 and 8/28. Reports she is out of abx and cough syrup she was given. No fever. Has been taking tylenol .

## 2023-11-02 NOTE — Discharge Instructions (Signed)
 Take the azithromycin  as directed.  You will take 2 pills today, then 1 a day until gone.  Take this antibiotic with food Make sure you are drinking lots of water Take the cough medicine 2 times a day as directed This can cause drowsiness so do not drive on the strong cough medicine Call for problems See your doctor if not improved by next week

## 2023-11-03 ENCOUNTER — Other Ambulatory Visit

## 2023-11-03 ENCOUNTER — Ambulatory Visit (INDEPENDENT_AMBULATORY_CARE_PROVIDER_SITE_OTHER): Admitting: Nurse Practitioner

## 2023-11-03 ENCOUNTER — Encounter: Payer: Self-pay | Admitting: Nurse Practitioner

## 2023-11-03 ENCOUNTER — Telehealth: Payer: Self-pay

## 2023-11-03 VITALS — BP 90/56 | HR 90 | Ht 69.0 in | Wt 274.0 lb

## 2023-11-03 DIAGNOSIS — K5900 Constipation, unspecified: Secondary | ICD-10-CM

## 2023-11-03 DIAGNOSIS — K219 Gastro-esophageal reflux disease without esophagitis: Secondary | ICD-10-CM

## 2023-11-03 DIAGNOSIS — R162 Hepatomegaly with splenomegaly, not elsewhere classified: Secondary | ICD-10-CM | POA: Diagnosis not present

## 2023-11-03 DIAGNOSIS — K760 Fatty (change of) liver, not elsewhere classified: Secondary | ICD-10-CM

## 2023-11-03 DIAGNOSIS — R1013 Epigastric pain: Secondary | ICD-10-CM

## 2023-11-03 DIAGNOSIS — Z1211 Encounter for screening for malignant neoplasm of colon: Secondary | ICD-10-CM

## 2023-11-03 LAB — CBC WITH DIFFERENTIAL/PLATELET
Basophils Absolute: 0.1 K/uL (ref 0.0–0.1)
Basophils Relative: 0.8 % (ref 0.0–3.0)
Eosinophils Absolute: 0.2 K/uL (ref 0.0–0.7)
Eosinophils Relative: 2.2 % (ref 0.0–5.0)
HCT: 40.9 % (ref 36.0–46.0)
Hemoglobin: 13.4 g/dL (ref 12.0–15.0)
Lymphocytes Relative: 27.4 % (ref 12.0–46.0)
Lymphs Abs: 2.2 K/uL (ref 0.7–4.0)
MCHC: 32.7 g/dL (ref 30.0–36.0)
MCV: 78.2 fl (ref 78.0–100.0)
Monocytes Absolute: 0.5 K/uL (ref 0.1–1.0)
Monocytes Relative: 6.4 % (ref 3.0–12.0)
Neutro Abs: 5 K/uL (ref 1.4–7.7)
Neutrophils Relative %: 63.2 % (ref 43.0–77.0)
Platelets: 219 K/uL (ref 150.0–400.0)
RBC: 5.24 Mil/uL — ABNORMAL HIGH (ref 3.87–5.11)
RDW: 14.2 % (ref 11.5–15.5)
WBC: 7.9 K/uL (ref 4.0–10.5)

## 2023-11-03 LAB — HEPATIC FUNCTION PANEL
ALT: 41 U/L — ABNORMAL HIGH (ref 0–35)
AST: 49 U/L — ABNORMAL HIGH (ref 0–37)
Albumin: 4.2 g/dL (ref 3.5–5.2)
Alkaline Phosphatase: 53 U/L (ref 39–117)
Bilirubin, Direct: 0.1 mg/dL (ref 0.0–0.3)
Total Bilirubin: 0.6 mg/dL (ref 0.2–1.2)
Total Protein: 8 g/dL (ref 6.0–8.3)

## 2023-11-03 LAB — BASIC METABOLIC PANEL WITH GFR
BUN: 22 mg/dL (ref 6–23)
CO2: 24 meq/L (ref 19–32)
Calcium: 9.5 mg/dL (ref 8.4–10.5)
Chloride: 97 meq/L (ref 96–112)
Creatinine, Ser: 1.28 mg/dL — ABNORMAL HIGH (ref 0.40–1.20)
GFR: 47.16 mL/min — ABNORMAL LOW (ref >60.00)
Glucose, Bld: 316 mg/dL — ABNORMAL HIGH (ref 70–99)
Potassium: 4.2 meq/L (ref 3.5–5.1)
Sodium: 133 meq/L — ABNORMAL LOW (ref 135–145)

## 2023-11-03 NOTE — Telephone Encounter (Signed)
 [facility] Pre-operative Risk Assessment     Request for surgical clearance:     Endoscopy Procedure  What type of surgery is being performed?     EUS  When is this surgery scheduled?     TBD  What type of clearance is required ?   Pharmacy  Are there any medications that need to be held prior to surgery and how long? Plavix  5 days  Practice name and name of physician performing surgery?      [facility]  What is your office phone and fax number?      Phone- [phone]  Fax- [phone]  Anesthesia type (None, local, MAC, general) ?       MAC   Please route your response to Corean [place], [facility]

## 2023-11-03 NOTE — Progress Notes (Unsigned)
 11/03/2023 [name] [id number] Dec 28, 1970   Chief Complaint:  History of Present Illness: Shantera Monts is a 53 year old female with a past medical history of osteoarthritis, obesity, hypertension, hyperlipidemia, coronary artery disease s/p MI and stent placement on Plavix  and ASA,  aortic atherosclerosis, DM type II, CKD stage IIIb, GERD, pancreatitis, hepatic steatosis and hepatosplenomegaly. She was initially seen in office by [facility] May NP on 05/04/2023 for further evaluation regarding chest pain and acid reflux.   An abdominal MRI/MRCP with and without contrast 04/22/2023 at [facility] showed the CBD within the pancreatic head was prominent measuring up to 1.1 cm and tapers smoothly to the ampulla without calculus or other obstruction.  In retrospective review this is unchanged when compared to examination dating back to 2022 and of doubtful significance in the absence of clinical evidence of cholestasis.  Hepatomegaly with marked hepatosteatosis and splenomegaly were also noted.  It appears that she has gastroparesis and this impaired a recent EUS attempt  She had a prominent CBD in the pancreatic head and EUS was to follow this up  She has had prior pancreatitis  I do not think she has ever had colon cancer screening but the upper symptoms seem to be predominate for now.  We could consider a Cologuard I guess?  We will see how things go after your office visit with her and we can try to reschedule the EUS with a 24-hour liquid diet plan almost as if she were having a colonoscopy but without the prep   Seen in urgent care x 3, last visit 9/3 Patient known to me from prior visits.  He is here today for cough.  This is her third visit since 10/20/2023.  She was prescribed Augmentin .  States she was unable to take it because it upset her stomach.  Only took 2 days.  Thought she was feeling a bit better than the cough came back.  The cough keeps her awake at night.  She is terribly  tired.  Feels short of breath.  States she cannot take prednisone  because of her diabetes.  Prior smoker.  Denies emphysema or lung disease.  Did not tolerate Augmentin , given Z pak Sinusitis    Patients last colonoscopy and EGD was approximately 5 years in [place] , [place] and per patient normal. Reports she was told she is due in 5 years.  Patient's family history unknown, she was adopted. She does mention she had one episode of pancreatitis last April, per patient she was told it was due to her starting Skyrizi for psoriasis.     Cardiac clearance 08/02/2023 was previously received prior to her scheduled EGD/EUS 10/10/2023.  Plavix  was held for 5 days.  She had epigastrici pain last night, itchiness and cold sweats  Pain lasted 11 PM until 9 AM.   Pain comes and goes. Prior to yesterday, one week before. Intermittent 7 to 8 months. No N/V. Sometimes brings up acid. She takes Famotidine OTC PRN. More than 2 times weekly.   Pancreatitis 03/2023  Colonoscopy normal [place]  6 years ago   Bms passing small balls of stools  Miralax  No blood with BM         Latest Ref Rng & Units 02/26/2023    4:48 PM 07/20/2022    4:40 PM 04/12/2021    1:26 PM  CBC  WBC 4.0 - 10.5 K/uL 7.7  9.2  7.7   Hemoglobin 12.0 - 15.0 g/dL 86.0  85.8  14.5   Hematocrit 36.0 - 46.0 % 43.4  45.9  43.5   Platelets 150 - 400 K/uL 208  194  233        Latest Ref Rng & Units 05/04/2023   10:40 AM 02/26/2023    4:48 PM 07/20/2022    6:32 PM  CMP  Glucose 70 - 99 mg/dL  801    BUN 6 - 20 mg/dL  12    Creatinine 9.55 - 1.00 mg/dL  8.99    Sodium 864 - 854 mmol/L  137    Potassium 3.5 - 5.1 mmol/L  3.9    Chloride 98 - 111 mmol/L  103    CO2 22 - 32 mmol/L  26    Calcium  8.9 - 10.3 mg/dL  9.3    Total Protein 6.0 - 8.3 g/dL 7.6  6.9  7.5   Total Bilirubin 0.2 - 1.2 mg/dL 0.4  0.3  1.0   Alkaline Phos 39 - 117 U/L 93  53  69   AST 0 - 37 U/L 48  41  55   ALT 0 - 35 U/L 49  48  55      07/20/2022: FINDINGS: Lower chest: No acute abnormality.   Hepatobiliary: Liver is enlarged. There is fatty infiltration of the liver. Bile ducts and gallbladder are within normal limits.   Pancreas: Unremarkable. No pancreatic ductal dilatation or surrounding inflammatory changes.   Spleen: Mildly enlarged, increased in size from prior.   Adrenals/Urinary Tract: Adrenal glands are unremarkable. Kidneys are normal, without renal calculi, focal lesion, or hydronephrosis. Bladder is distended.   Stomach/Bowel: Stomach is within normal limits. No evidence of bowel wall thickening, distention, or inflammatory changes. There are few scattered colonic diverticula. The appendix is surgically absent.   Vascular/Lymphatic: No significant vascular findings are present. No enlarged abdominal or pelvic lymph nodes.   Reproductive: Uterus and bilateral adnexa are unremarkable.   Other: No abdominal wall hernia or abnormality. No abdominopelvic ascites.   Musculoskeletal: L4-5 posterior fusion hardware is present.   IMPRESSION: 1. No acute localizing process in the abdomen or pelvis. 2. Hepatosplenomegaly. 3. Fatty infiltration of the liver. 4. Colonic diverticulosis.   CTAP with contrast 12/05/2020 done due to abdominal pain and diarrhea: FINDINGS: Lower chest: The lung bases are clear.   Hepatobiliary: Diffuse fatty infiltration of the liver. No focal liver lesions. Gallbladder and bile ducts are unremarkable.   Pancreas: Unremarkable. No pancreatic ductal dilatation or surrounding inflammatory changes.   Spleen: Normal in size without focal abnormality.   Adrenals/Urinary Tract: Adrenal glands are unremarkable. Kidneys are normal, without renal calculi, focal lesion, or hydronephrosis. Bladder is unremarkable.   Stomach/Bowel: Stomach, small bowel, and colon are not abnormally distended. Under distention limits evaluation but there appears to be wall thickening of the  transverse colon. Although this could be artifact of under distension, this could indicate a focal colitis. No abscess or collection is identified. No significant diverticular disease. Appendix is surgically absent.   Vascular/Lymphatic: No significant vascular findings are present. No enlarged abdominal or pelvic lymph nodes.   Reproductive: Uterus and ovaries are not enlarged.   Other: No free air or free fluid in the abdomen. Abdominal wall musculature appears intact.   Musculoskeletal: Postoperative changes with posterior fixation of L4-5. Normal alignment of the lumbar spine. No destructive bone lesions.   IMPRESSION: 1. Wall thickening suggested in the transverse colon possibly indicating focal colitis. Follow-up after resolution of acute process is recommended to exclude underlying neoplastic  lesion. No evidence of obstruction. 2. Diffuse fatty infiltration of the liver. 3. Postoperative changes in the lower lumbar spine.  RUQ sono 04/07/23: 1. No cholelithiasis or sonographic evidence for acute cholecystitis. 2. Common bile duct is prominent measuring 7.7 mm. Recommend correlation with LFTs. If there is concern for biliary obstruction, recommend MRI/MRCP. 3. Increased hepatic parenchymal echogenicity suggestive of steatosis.     Abdominal MRI/MRCP 04/22/2023:  FINDINGS:  Lower chest: No acute abnormality.   Hepatobiliary: No solid liver abnormality is seen. Hepatomegaly,  maximum coronal span 24.5 cm. Marked hepatic steatosis. Contracted  gallbladder. No gallstones or gallbladder wall thickening. Low  insertion of the cystic duct. The common bile duct within the  pancreatic head is prominent measuring up to 1.1 cm in caliber,  however tapers smoothly to the ampulla without calculus or other  obstruction (series 8, image 8).   Pancreas: Intrapancreatic splenule within the tip of the pancreatic  tail (series 18, image 65). No pancreatic ductal dilatation or   surrounding inflammatory changes.   Spleen: Splenomegaly, maximum span 15.1 cm.   Adrenals/Urinary Tract: Adrenal glands are unremarkable. Kidneys are  normal, without renal calculi, solid lesion, or hydronephrosis.   Stomach/Bowel: Stomach is within normal limits. No evidence of bowel  wall thickening, distention, or inflammatory changes.   Vascular/Lymphatic: No significant vascular findings are present. No  enlarged abdominal lymph nodes.   Other: No abdominal wall hernia or abnormality. No ascites.   Musculoskeletal: No acute or significant osseous findings.   Impression:  1. The common bile duct within the pancreatic head is prominent  measuring up to 1.1 cm in caliber, however tapers smoothly to the  ampulla without calculus or other obstruction. In retrospective  review this is unchanged when compared to examinations dating back  to dated 2022 and of doubtful significance in the absence of  clinical evidence of cholestasis.  2. Hepatomegaly and marked hepatic steatosis.  3. Splenomegaly.   GI PROCEDURES:  EGD 10/10/2023 per Dr Wilhelmenia: - White nummular lesions in esophageal mucosa. Biopsied.  - Z-line irregular, 40 cm from the incisors.  - A large amount of food (residue) in the stomach.  - Retained food in the duodenum.  - Impairment of gastric and duodenal mucosa as a result of significant foodstuffs. A. ESOPHAGUS, BIOPSY:  Reactive squamous mucosa  Negative for glandular epithelium, eosinophils, acute inflammation,  dysplasia and carcinoma  Fungal organisms not identified    - Gastroparesis diet is recommended.  - Follow-up pathology and treat for Candida if positive).  - May restart Plavix  this evening or tomorrow morning, depending on when she takes it normally.  - Continue present medications otherwise.  - Further evaluation/treatment with primary GI, to optimize what appears to be gastroparesis (noted in prior history).  - If we are going to try to  redo EUS, she will need to be on clear liquids the entire day prior to her procedure. Will discuss with primary GI to help them let me know when she is ready for repeat EUS attempt.  Past Medical History:  Diagnosis Date   CAD (coronary artery disease)    Cervical radiculopathy 08/15/2017   CKD (chronic kidney disease), stage II    COVID-19 01/2022   DM (diabetes mellitus) (HCC)    HTN (hypertension)    Hyperlipidemia    Lumbar radiculopathy 06/21/2019   Last Assessment & Plan: Formatting of this note might be different from the original. Scheduled for laminectomy L5 4/27 Pre-op complete at hospital; labs and ekg  reviewed. Scheduled for cardiology eval in one day Medical cleared for procedure by IM evaluation.   Myocardial infarction [facility])    Neuropathy    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   1. Severe tendinosis of the supraspinatus tendon.  2. Mild tendinosis of the infraspinatus tendon.  3. Thickening of the inferior joint capsule and intermediate signal  material effacing the normal subcoracoid fat as can be seen with  adhesive capsulitis.    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   Past Surgical History:  Procedure Laterality Date   APPENDECTOMY     BACK SURGERY     CESAREAN SECTION     CORONARY ANGIOPLASTY WITH STENT PLACEMENT     ESOPHAGOGASTRODUODENOSCOPY N/A 10/10/2023   Procedure: EGD (ESOPHAGOGASTRODUODENOSCOPY);  Surgeon: Wilhelmenia Aloha Raddle., MD;  Location: [facility];  Service: Gastroenterology;  Laterality: N/A;   MULTIPLE TOOTH EXTRACTIONS     ROTATOR CUFF REPAIR         Current Medications, Allergies, Past Medical History, Past Surgical History, Family History and Social History were reviewed in [employer] record.   Review of Systems:   Constitutional: Negative for fever, sweats, chills or weight loss.  Respiratory: Negative for  shortness of breath.   Cardiovascular: Negative for chest pain, palpitations and leg swelling.  Gastrointestinal: See HPI.  Musculoskeletal: Negative for back pain or muscle aches.  Neurological: Negative for dizziness, headaches or paresthesias.    Physical Exam: There were no vitals taken for this visit. General: in no acute distress. Head: Normocephalic and atraumatic. Eyes: No scleral icterus. Conjunctiva pink . Ears: Normal auditory acuity. Mouth: Dentition intact. No ulcers or lesions.  Lungs: Clear throughout to auscultation. Heart: Regular rate and rhythm, no murmur. Abdomen: Soft, nontender and nondistended. No masses or hepatomegaly. Normal bowel sounds x 4 quadrants.  Rectal: *** Musculoskeletal: Symmetrical with no gross deformities. Extremities: No edema. Neurological: Alert oriented x 4. No focal deficits.  Psychological: Alert and cooperative. Normal mood and affect  Assessment and Recommendations: ***

## 2023-11-03 NOTE — Patient Instructions (Addendum)
 Follow up with Cardiology in regard to low blood pressure. Go to the ED if you develop any shortness of breath or dizziness.  We will contact you in regard to scheduling the EUS with Dr Wilhelmenia.   You will be contacted by our office prior to your procedure for directions on holding your Plavix .  If you do not hear from our office 1 week prior to your scheduled procedure, please call [phone] to discuss.   Your provider has requested that you go to the basement level for lab work before leaving today. Press B on the elevator. The lab is located at the first door on the left as you exit the elevator.   _______________________________________________________  If your blood pressure at your visit was 140/90 or greater, please contact your primary care physician to follow up on this.  _______________________________________________________  If you are age 55 or older, your body mass index should be between 23-30. Your Body mass index is 40.46 kg/m. If this is out of the aforementioned range listed, please consider follow up with your Primary Care Provider.  If you are age 35 or younger, your body mass index should be between 19-25. Your Body mass index is 40.46 kg/m. If this is out of the aformentioned range listed, please consider follow up with your Primary Care Provider.   ________________________________________________________  The [facility] providers would like to encourage you to use MYCHART to communicate with providers for non-urgent requests or questions.  Due to long hold times on the telephone, sending your provider a message by [facility] may be a faster and more efficient way to get a response.  Please allow 48 business hours for a response.  Please remember that this is for non-urgent requests.  _______________________________________________________  [facility] is using a team-based approach to care.  Your team is made up of your doctor and two to three APPS.  Our APPS (Nurse Practitioners and Physician Assistants) work with your physician to ensure care continuity for you. They are fully qualified to address your health concerns and develop a treatment plan. They communicate directly with your gastroenterologist to care for you. Seeing the Advanced Practice Practitioners on your physician's team can help you by facilitating care more promptly, often allowing for earlier appointments, access to diagnostic testing, procedures, and other specialty referrals.

## 2023-11-03 NOTE — Telephone Encounter (Signed)
Called to check on patient. No answer. Left voicemail.

## 2023-11-04 ENCOUNTER — Encounter: Payer: Self-pay | Admitting: Nurse Practitioner

## 2023-11-04 NOTE — Telephone Encounter (Signed)
   Patient Name: Jean Calderon  DOB: [birth year] MRN: [id number]  Primary Cardiologist: None  Chart reviewed as part of pre-operative protocol coverage. Given past medical history and time since last visit, based on ACC/AHA guidelines, Kateleen Encarnacion is at acceptable risk for the planned procedure without further cardiovascular testing.   Per office protocol, if patient is without any new symptoms or concerns at the time of their virtual visit, she may hold Plavix  for 5 days prior to procedure. Please resume Plavix  as soon as possible postprocedure, at the discretion of the surgeon.    The patient was advised that if she develops new symptoms prior to surgery to contact our office to arrange for a follow-up visit, and she verbalized understanding.  I will route this recommendation to the requesting party via Epic fax function and remove from pre-op pool.  Please call with questions.  Lamarr Satterfield, NP 11/04/2023, 9:05 AM

## 2023-11-04 NOTE — Progress Notes (Signed)
 Jean Calderon, pls contact patient and schedule her for a colonoscopy in [facility] with Dr. Albertus. Pls send cardiologist Plavix  hold request.   Note, [name] will schedule patient for EUS with Dr. Wilhelmenia at [facility], will also need Plavix  hold clearance for that procedure.  Also, pls remind patient that she must be 100% recovered from her upper respiratory/sinusitis infections prior to proceeding with either procedure.   Pls let me know date of colonoscopy

## 2023-11-06 ENCOUNTER — Ambulatory Visit: Payer: Self-pay | Admitting: Nurse Practitioner

## 2023-11-07 ENCOUNTER — Other Ambulatory Visit: Payer: Self-pay

## 2023-11-07 ENCOUNTER — Ambulatory Visit: Admitting: Nurse Practitioner

## 2023-11-07 DIAGNOSIS — Z1211 Encounter for screening for malignant neoplasm of colon: Secondary | ICD-10-CM

## 2023-11-07 MED ORDER — NA SULFATE-K SULFATE-MG SULF 17.5-3.13-1.6 GM/177ML PO SOLN: 1.0000 | Freq: Once | ORAL | 0 refills | Status: AC

## 2023-11-07 NOTE — Telephone Encounter (Signed)
 Pt scheduled for colon on 10/1 with Dr Albertus in [facility]. She has been informed of her Plavix  hold and states understanding.   Insulin  pump clearance faxed 11/07/23, waiting for response.

## 2023-11-07 NOTE — Progress Notes (Signed)
 Stephanie, pls let me know when colonoscopy scheduled. THX.

## 2023-11-07 NOTE — Progress Notes (Signed)
 Excellent work Designer, fashion/clothing, thanks.

## 2023-11-07 NOTE — Progress Notes (Signed)
 Patient has been scheduled for a colonoscopy on 11/30/23 with Dr Albertus. Plavix  clearance already received and pt informed. Prep and prep instructions sent. Noticed pt has an insulin  pump, insulin  pump clearance letter faxed 11/07/23 .

## 2023-11-10 ENCOUNTER — Telehealth: Payer: Self-pay

## 2023-11-10 ENCOUNTER — Other Ambulatory Visit: Payer: Self-pay

## 2023-11-10 DIAGNOSIS — Z8719 Personal history of other diseases of the digestive system: Secondary | ICD-10-CM

## 2023-11-10 DIAGNOSIS — K861 Other chronic pancreatitis: Secondary | ICD-10-CM

## 2023-11-10 DIAGNOSIS — K838 Other specified diseases of biliary tract: Secondary | ICD-10-CM

## 2023-11-10 DIAGNOSIS — Z1211 Encounter for screening for malignant neoplasm of colon: Secondary | ICD-10-CM

## 2023-11-10 DIAGNOSIS — K219 Gastro-esophageal reflux disease without esophagitis: Secondary | ICD-10-CM

## 2023-11-10 DIAGNOSIS — K831 Obstruction of bile duct: Secondary | ICD-10-CM

## 2023-11-10 NOTE — Telephone Encounter (Signed)
-----   Message from [facility] sent at 11/08/2023 10:50 AM EDT ----- Blondie, Since it is going to be so complex for the patient to have repeat instructions and Plavix  holds and insulin  pump adjustments, in this particular case I am okay to perform her colonoscopy at same time as the EUS. Please move forward with scheduling as able. Thanks. GM ----- Message ----- From: Suellen Blondie, CMA Sent: 11/07/2023   4:31 PM EDT To: Aloha Wilhelmenia Raddle., MD  Dr Wilhelmenia, I know you said that the Colonoscopy should be done separately, but this patient is on Plavix  and has an insulin  pump. Patient will need to hold Plavix  and adjust her insulin  pump x2. Is it possible that we can schedule both to make it more convenient for the patient and maybe a less confusing for her? I was looking at the end of Oct to get her scheduled for EUS.   Please advise.   Kandiss Ihrig ----- Message ----- From: Wilhelmenia Aloha Raddle., MD Sent: 11/04/2023  10:29 AM EDT To: Gordy CHRISTELLA Starch, MD; Blondie Suellen, CMA; Colle#  CKS, I am OK for EUS to be rescheduled as you have outlined.  Unfortunately, will not be able to coordinate this with Colonoscopy at same time if necessary.  Marieliz Strang, Please schedule as able Upper EUS. Thanks. GM ----- Message ----- From: Cara Elida CHRISTELLA, NP Sent: 11/04/2023   9:00 AM EDT To: Gordy CHRISTELLA Starch, MD; Aloha Wilhelmenia Raddle., MD#  Dr.Mansouraty, pls review case and verify if ok to schedule EUS at [facility], clear liquid diet x 24 hrs prior to procedure date, NPO after midnight. Pls let Jose Alleyne know if ok to schedule.  Will need to requests Plavix  hold instructions from cardiology.  Dr. Starch, patient wishes to pursue a colonoscopy. Pls verify if ok to schedule a conventional colonoscopy or if you prefer she complete a cologuard test first. THX.

## 2023-11-10 NOTE — Telephone Encounter (Signed)
 Called and spoke with patient. Her colonoscopy in [facility] with Dr Albertus has been cancelled. Patient has been r/s to [facility] 12/28/23 with Dr.Mansouraty for Colonoscopy and EUS. New instructions will be sent to patient. Patient has been advised okay to hold Plavix  x5 days prior to procedure. Patient has also been given instructions on how to adjust insulin  pump prior to procedure. Patient voiced understanding and will let us  know if she has any questions.

## 2023-11-14 ENCOUNTER — Ambulatory Visit

## 2023-11-18 NOTE — Telephone Encounter (Signed)
 Insulin  pump clearance faxed a 2nd time 11/18/23

## 2023-11-30 ENCOUNTER — Ambulatory Visit
Admission: EM | Admit: 2023-11-30 | Discharge: 2023-11-30 | Disposition: A | Discharge status: Home / Self Care | Attending: Family Medicine | Admitting: Family Medicine

## 2023-11-30 ENCOUNTER — Encounter: Admitting: Internal Medicine

## 2023-11-30 ENCOUNTER — Other Ambulatory Visit: Payer: Self-pay

## 2023-11-30 DIAGNOSIS — E1165 Type 2 diabetes mellitus with hyperglycemia: Secondary | ICD-10-CM | POA: Diagnosis not present

## 2023-11-30 DIAGNOSIS — Z794 Long term (current) use of insulin: Secondary | ICD-10-CM

## 2023-11-30 DIAGNOSIS — M792 Neuralgia and neuritis, unspecified: Secondary | ICD-10-CM

## 2023-11-30 DIAGNOSIS — E1349 Other specified diabetes mellitus with other diabetic neurological complication: Secondary | ICD-10-CM

## 2023-11-30 MED ORDER — AMITRIPTYLINE HCL 25 MG PO TABS: 25.0000 mg | ORAL_TABLET | Freq: Every day | ORAL | 0 refills | Status: AC

## 2023-11-30 NOTE — Discharge Instructions (Addendum)
 Continue taking pregabalin  (Lyrica ) for your diabetic nerve pain Try amitriptyline  (Elavil ) an hour before bed to see if this helps her sleep Call your primary care doctor if you need refills

## 2023-11-30 NOTE — ED Triage Notes (Signed)
 Since yesterday has zapping pain to right 1st toe (on top of toe). Has been taking tylenol .

## 2023-11-30 NOTE — ED Provider Notes (Signed)
 [facility]    CSN: [id number] Arrival date & time: 11/30/23  1245      History   Chief Complaint Chief Complaint  Patient presents with   Toe Pain    HPI Jean Calderon is a 53 y.o. female.   Patient is known to me from prior visits.  She is a medically complex patient with multiple medical problems compounded by noncompliance.  She is a poorly controlled diabetic with diabetic neuropathy who comes in complaining of nerve pain.  She also has a history of meralgia paresthetica and lumbar radiculopathy, morbid obesity, chronic pain syndrome, hypertension hyperlipidemia and coronary artery disease.  Among many diagnoses.  Polypharmacy.  She comes to me today stating she is having lancinating nerve pain into her right great toe.  It is random.  It kept her awake last night.  She takes pregabalin  200 mg 3 times daily.  She states Tylenol  has not helped her.  She states she cannot take anti-inflammatory drugs because of her Plavix  and her GERD.    Past Medical History:  Diagnosis Date   CAD (coronary artery disease)    Cervical radiculopathy 08/15/2017   CKD (chronic kidney disease), stage II    COVID-19 01/2022   DM (diabetes mellitus) (HCC)    HTN (hypertension)    Hyperlipidemia    Lumbar radiculopathy 06/21/2019   Last Assessment & Plan: Formatting of this note might be different from the original. Scheduled for laminectomy L5 4/27 Pre-op complete at hospital; labs and ekg reviewed. Scheduled for cardiology eval in one day Medical cleared for procedure by IM evaluation.   Myocardial infarction 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 Severe tendinosis of the supraspinatus tendon.  2. Mild tendinosis of the infraspinatus tendon.  3. Thickening of the inferior joint capsule and intermediate signal  material effacing the normal subcoracoid fat as can be seen with  adhesive capsulitis.    Other cervical disc degeneration, unspecified  cervical region 11/11/2016   Ulnar neuropathy of both upper extremities 12/23/2021    Patient Active Problem List   Diagnosis Date Noted   Mucosal abnormality of esophagus 10/10/2023   Dysphagia 10/10/2023   Chronic pancreatitis (HCC) 05/13/2023   Elevated LFTs 05/13/2023   Common bile duct dilation 05/13/2023   Chronic midline low back pain 07/19/2022   History of lumbar fusion 07/19/2022   Chronic left SI joint pain 07/19/2022   Numbness and tingling of left leg 07/19/2022   Chronic pain syndrome 07/19/2022   Uncontrolled type 1 diabetes mellitus with hyperglycemia, with long-term current use of insulin  (HCC) 06/23/2022   CKD stage G3b/A3, GFR 30-44 and albumin creatinine ratio >300 mg/g (HCC) 06/06/2022   Transaminitis 06/06/2022   Acute pancreatitis 06/06/2022   Ulnar neuropathy of both upper extremities 12/23/2021   Hyperlipidemia 10/01/2021   Diabetic neuropathy (HCC) 09/30/2021   Meralgia paresthetica, right lower limb 09/30/2021   Gait instability 07/30/2021   Migraine without status migrainosus, not intractable 07/30/2021   Poorly controlled type 2 diabetes mellitus with peripheral neuropathy (HCC) 06/05/2021   Menopausal symptoms 04/15/2021   Anxiety 03/25/2021   CAD (coronary artery disease)    HTN (hypertension)    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   Heart valve disease 01/06/2021   Vitamin B12 deficiency 12/03/2020   Vitamin D deficiency 12/03/2020   Chronic left shoulder pain 11/25/2020   Generalized anxiety disorder 11/25/2020   Psoriasis 01/01/2020   Lumbar radiculopathy 06/21/2019  Uterine leiomyoma 03/06/2019   Depression with anxiety 02/02/2018   Primary insomnia 02/02/2018   Class 2 severe obesity due to excess calories with serious comorbidity and body mass index (BMI) of 38.0 to 38.9 in adult 10/12/2017   Insulin  pump in place 10/12/2017   Cervical radiculopathy 08/15/2017   Family history of early CAD 07/21/2017   Obstructive sleep apnea  syndrome 07/21/2017   Chronic right shoulder pain 05/30/2017   Type 2 diabetes mellitus with mild nonproliferative diabetic retinopathy without macular edema, bilateral (HCC) 03/18/2017   Congestive heart failure (HCC) 03/16/2017   Other cervical disc degeneration, unspecified cervical region 11/11/2016   Adhesive capsulitis of right shoulder 10/08/2016   GERD (gastroesophageal reflux disease) 07/19/2016   Spinal stenosis of cervical region 12/01/2015   Cervical disc herniation 12/01/2015   Bilateral carpal tunnel syndrome 11/04/2015   Lesion of ulnar nerve, left upper limb 11/04/2015   Stented coronary artery 07/07/2015    Past Surgical History:  Procedure Laterality Date   APPENDECTOMY     BACK SURGERY     CESAREAN SECTION     CORONARY ANGIOPLASTY WITH STENT PLACEMENT     ESOPHAGOGASTRODUODENOSCOPY N/A 10/10/2023   Procedure: EGD (ESOPHAGOGASTRODUODENOSCOPY);  Surgeon: Wilhelmenia Aloha Raddle., MD;  Location: [facility];  Service: Gastroenterology;  Laterality: N/A;   MULTIPLE TOOTH EXTRACTIONS     ROTATOR CUFF REPAIR      OB History   No obstetric history on file.      Home Medications    Prior to Admission medications   Medication Sig Start Date End Date Taking? Authorizing Provider  amitriptyline  (ELAVIL ) 25 MG tablet Take 1 tablet (25 mg total) by mouth at bedtime. Take an hour before bed for nerve pain 11/30/23  Yes Maranda Jamee Jacob, MD  acetaminophen  (TYLENOL ) 500 MG tablet Take 1 tablet (500 mg total) by mouth every 6 (six) hours as needed. 10/20/23   Dreama, [place]  N, FNP  aspirin  EC 81 MG tablet Take 81 mg by mouth daily.    [provider]  atorvastatin  (LIPITOR) 40 MG tablet TAKE 1 TABLET(40 MG) BY MOUTH DAILY 04/01/23   Rucker, Alethea Y, MD  azelastine (OPTIVAR) 0.05 % ophthalmic solution Apply 1 drop to eye daily. 06/26/23   [provider]  clopidogrel  (PLAVIX ) 75 MG tablet Take 1 tablet (75 mg total) by mouth daily. 09/19/23   Pietro Redell RAMAN, MD  Continuous Blood Gluc Receiver (DEXCOM G7 RECEIVER) DEVI Check sugars daily Dx E11.9 04/05/22   Colette Torrence GRADE, MD  Continuous Blood Gluc Sensor (DEXCOM G7 SENSOR) MISC Check sugars daily Dx E11.9 04/05/22   Colette Torrence GRADE, MD  Continuous Glucose Transmitter (DEXCOM G6 TRANSMITTER) MISC  07/01/22   [provider]  Crisaborole (EUCRISA) 2 % OINT Apply 1 Application topically 2 (two) times daily. 06/11/20   [provider]  Estradiol -Norethindrone  Acet 0.5-0.1 MG tablet Take 1 tablet by mouth daily. 12/06/22   Colette Torrence GRADE, MD  fluticasone  (FLONASE ) 50 MCG/ACT nasal spray Place 1 spray into both nostrils daily. 06/26/23   [provider]  glipiZIDE (GLUCOTROL XL) 5 MG 24 hr tablet Take 5 mg by mouth daily with breakfast.    [provider]  HUMALOG  100 UNIT/ML injection INJECT 25 UNITS INTO THE SKIN 3 TIMES DAILY BEFORE MEALS. USE INSULIN  PUMP 05/12/22   Colette Torrence GRADE, MD  Insulin  Aspart, w/Niacinamide, (FIASP ) 100 UNIT/ML SOLN To be used with insulin  pump. 07/08/22   [provider]  Insulin  Disposable Pump (  OMNIPOD 5 G6 PODS, GEN 5,) MISC CHANGE POD EVERY OTHER DAY. 06/07/22   Colette Torrence GRADE, MD  isosorbide  mononitrate (IMDUR ) 30 MG 24 hr tablet TAKE 1 TABLET(30 MG) BY MOUTH DAILY 02/21/23   Colette Torrence GRADE, MD  JARDIANCE  25 MG TABS tablet Take 1 tablet (25 mg total) by mouth daily. 06/15/22   Colette Torrence GRADE, MD  ketoconazole  (NIZORAL ) 2 % shampoo Apply to affected areas 3 x a week for 8 weeks 11/18/22   Colette Torrence GRADE, MD  lisinopril -hydrochlorothiazide  (ZESTORETIC ) 20-12.5 MG tablet Take 1 tablet by mouth daily. 10/27/23   [provider]  metoprolol  succinate (TOPROL -XL) 100 MG 24 hr tablet Take 1 tablet (100 mg total) by mouth daily. 11/12/22   Pietro Redell RAMAN, MD  mometasone  (NASONEX ) 50 MCG/ACT nasal spray One spray in each nostril twice a day, use left hand for right nostril, and right hand for left nostril.  Please  dispense one bottle. 05/24/22   Colette Torrence GRADE, MD  montelukast  (SINGULAIR ) 10 MG tablet TAKE 1 TABLET(10 MG) BY MOUTH AT BEDTIME 04/25/23   Rucker, Torrence GRADE, MD  nitroGLYCERIN  (NITROSTAT ) 0.4 MG SL tablet Place 1 tablet (0.4 mg total) under the tongue every 5 (five) minutes as needed for chest pain. 11/12/22   Pietro Redell RAMAN, MD  NOVOLOG  100 UNIT/ML injection Inject 100 Units into the skin once. 02/20/23   [provider]  nystatin  ointment (MYCOSTATIN ) Apply 1 Application topically 2 (two) times daily. 07/22/22   Colette Torrence GRADE, MD  olopatadine  (PATADAY ) 0.1 % ophthalmic solution Place 1 drop into both eyes 2 (two) times daily. 05/24/22   Colette Torrence GRADE, MD  pantoprazole  (PROTONIX ) 40 MG tablet Take 1 tablet (40 mg total) by mouth 2 (two) times daily. 05/04/23   May, Deanna J, NP  pregabalin  (LYRICA ) 200 MG capsule TAKE 1 CAPSULE BY MOUTH TWICE DAILY 11/09/22   Colette Torrence GRADE, MD  sertraline  (ZOLOFT ) 100 MG tablet Take 100 mg by mouth daily. 10/27/23   [provider]  solifenacin (VESICARE) 10 MG tablet Take 10 mg by mouth daily.    [provider]  sucralfate  (CARAFATE ) 1 g tablet TAKE 1 TABLET(1 GRAM) BY MOUTH FOUR TIMES DAILY AT BEDTIME WITH MEALS 05/09/23   Rucker, Torrence GRADE, MD  topiramate  (TOPAMAX ) 100 MG tablet Take by mouth 2 (two) times daily. 08/17/21   [provider]  traZODone  (DESYREL ) 100 MG tablet TAKE 1 TABLET(100 MG) BY MOUTH AT BEDTIME AS NEEDED FOR SLEEP 02/14/23   Colette Torrence GRADE, MD    Family History Family History  Problem Relation Age of Onset   Heart disease Mother    Heart disease Sister     Social History Social History   Tobacco Use   Smoking status: Former    Types: Cigarettes   Smokeless tobacco: Never  Vaping Use   Vaping status: Never Used  Substance Use Topics   Alcohol use: Never   Drug use: Never     Allergies   Tramadol, Nsaids, Sulfamethoxazole-trimethoprim, and Ibuprofen   Review of  Systems Review of Systems See HPI  Physical Exam Triage Vital Signs ED Triage Vitals  Encounter Vitals Group     BP 11/30/23 1249 108/62     Girls Systolic BP Percentile --      Girls Diastolic BP Percentile --      Boys Systolic BP Percentile --      Boys Diastolic BP Percentile --      Pulse Rate  11/30/23 1249 83     Resp 11/30/23 1249 16     Temp 11/30/23 1249 98.3 F (36.8 C)     Temp src --      SpO2 11/30/23 1249 93 %     Weight --      Height --      Head Circumference --      Peak Flow --      Pain Score 11/30/23 1253 9     Pain Loc --      Pain Education --      Exclude from Growth Chart --    No data found.  Updated Vital Signs BP 108/62   Pulse 83   Temp 98.3 F (36.8 C)   Resp 16   SpO2 93%       Physical Exam Constitutional:      General: She is not in acute distress.    Appearance: She is well-developed. She is obese.  HENT:     Head: Normocephalic and atraumatic.  Eyes:     Conjunctiva/sclera: Conjunctivae normal.     Pupils: Pupils are equal, round, and reactive to light.  Cardiovascular:     Rate and Rhythm: Normal rate.  Pulmonary:     Effort: Pulmonary effort is normal. No respiratory distress.  Abdominal:     General: There is no distension.     Palpations: Abdomen is soft.  Musculoskeletal:        General: Normal range of motion.     Cervical back: Normal range of motion.     Right lower leg: No edema.     Left lower leg: No edema.  Skin:    General: Skin is warm and dry.     Comments: Both feet are examined.  There is no ulcer or wound infection or orthopedic deformity noted.  There is no tenderness to palpation.  No pain with movement of ankle foot or toe on right side.  Neurological:     Mental Status: She is alert.      UC Treatments / Results  Labs (all labs ordered are listed, but only abnormal results are displayed) Labs Reviewed - No data to display  EKG   Radiology No results found.  Procedures Procedures  (including critical care time)  Medications Ordered in UC Medications - No data to display  Initial Impression / Assessment and Plan / UC Course  I have reviewed the triage vital signs and the nursing notes.  Pertinent labs & imaging results that were available during my care of the patient were reviewed by me and considered in my medical decision making (see chart for details).     Explained to patient that with her known neuropathy, lumbar problems, meralgia paresthetica occasional nerve pain is not surprising.  I do not suspect any significant pathology.  As I have told her before I will not give her narcotics for her chronic pain condition.  Since this appears to be a nerve pain I will give her a limited number of Elavil  to try at bedtime.  Concern for risk of serotonin syndrome.  Patient is told if it causes symptoms to stop immediately.  Call PCP for follow-up Final Clinical Impressions(s) / UC Diagnoses   Final diagnoses:  Paroxysmal nerve pain  Poorly controlled diabetes mellitus (HCC)  Other diabetic neurological complication associated with other specified diabetes mellitus [facility])     Discharge Instructions      Continue taking pregabalin  (Lyrica ) for your diabetic nerve pain Try  amitriptyline  (Elavil ) an hour before bed to see if this helps her sleep Call your primary care doctor if you need refills    ED Prescriptions     Medication Sig Dispense Auth. Provider   amitriptyline  (ELAVIL ) 25 MG tablet Take 1 tablet (25 mg total) by mouth at bedtime. Take an hour before bed for nerve pain 20 tablet Maranda Jamee Jacob, MD      PDMP not reviewed this encounter.   Maranda Jamee Jacob, MD 11/30/23 [phone]

## 2023-12-13 NOTE — Telephone Encounter (Addendum)
 Spoke with pt to verify who manages her insulin  pump. She states she got insulin  pump clearance instructions from [facility] 2 weeks ago. We have not received a letter with this information as of 12/13/23.   Clearance letter faxed a 3rd time to [facility] at [phone].

## 2023-12-16 NOTE — ED Provider Notes (Signed)
 [facility]  ED Provider Note  Tele-Medical screening initiated and orders placed by Sammi DELENA Blanch, MD. 12/16/2023 / 3:51 PM  53 y.o. female hx of DM presents with upper abdominal pain (points to LUQ) since yesterday. Reports husband and daughter had same meal night before, no symptoms. Associated nausea/vomiting. Endorses urinary frequency and thirst x 1 day. Denies constipation or diarrhea. Denies fever. Denies hx of abdominal surgeries. States has planned EDG and colonscopy at [facility].   Afebrile. VSS. No acute distress.  Patient seen and received a tele-medical screening examination in triage. The provider performing the medical screening exam was not located at the facility and was located remotely. Patient understands that the provider is seeing them remotely and consents to the exam. Appropriate orders have been initiated based on my brief physical exam and HPI. Patient placed in appropriate area until a treatment room becomes available for further evaluation and management by the in-house provider.   This tele-medical screening exam was electronically signed by Sammi DELENA Blanch, MD on 12/16/2023 at 3:51 PM   Jean Calderon 53 y.o. female DOB: 08-Nov-1970 MRN: [id number] History   Chief Complaint  Patient presents with  . Abdominal Pain    C/o left upper abd pain with n/v x 1 day    HPI     Past Medical History:  Diagnosis Date  . CAD (coronary artery disease)   . Diabetes mellitus (*)     Past Surgical History:  Procedure Laterality Date  . Back surgery    . Coronary angioplasty with stent placement      Social History   Substance and Sexual Activity  Alcohol Use Never   Tobacco Use History[1] E-Cigarettes  . Vaping Use Never User   . Start Date    . Cartridges/Day    . Quit Date     Social History   Substance and Sexual Activity  Drug Use Never         Allergies[2]  Home Medications   ACCU-CHEK SOFTCLIX LANCETS  LANCETS    Use to monitor blood glucose 4 time(s) daily.   ATORVASTATIN  (LIPITOR) 40 MG TABLET    Take one tablet (40 mg dose) by mouth daily.   BLOOD GLUCOSE MONITORING SUPPL (ACCU-CHEK GUIDE) W/DEVICE KIT    Use as directed by provider to monitor blood sugars up to 4 times daily   CLONIDINE  (CATAPRES ) 0.1 MG TABLET    Take one tablet (0.1 mg dose) by mouth 2 (two) times daily.   CLOPIDOGREL  BISULFATE (PLAVIX ) 75 MG TABLET    Take one tablet (75 mg dose) by mouth daily.   CONTINUOUS GLUCOSE SENSOR (DEXCOM G7 SENSOR) MISC    Place 1 each onto the skin every 10 days.   CYCLOBENZAPRINE  (FLEXERIL ) 10 MG TABLET    Take one tablet (10 mg dose) by mouth 3 (three) times a day as needed.   DULOXETINE  HCL (CYMBALTA ) 30 MG CAPSULE    Start with 1 tablet once daily for 1 week.  After this, message your doctor through MyChart to let her know about any potential side effects of the medication; if things are going well, we will increase to 1 tab 2 times daily.   ESTRADIOL -NORETHINDRONE  ACET 0.5-0.1 MG PER TABLET    Take one tablet by mouth daily.   FEXOFENADINE  (ALLEGRA ) 180 MG TABLET    Take one tablet (180 mg dose) by mouth daily.   FLUTICASONE  PROPIONATE (FLONASE ) 50 MCG/ACTUATION NASAL SPRAY    two sprays  daily.   GLIPIZIDE ER (GLUCOTROL XL) 10 MG 24 HR TABLET    Take one tablet (10 mg dose) by mouth daily.   GLUCOSE BLOOD (ACCU-CHEK GUIDE) TEST STRIP    Use to monitor blood glucose 4 time(s) daily   HYDROXYZINE  HCL (ATARAX ) 25 MG TABLET    Take one tablet (25 mg dose) by mouth 3 (three) times a day.   INSULIN  DISPOSABLE PUMP (OMNIPOD 5 DEXG7G6 PODS GEN 5) MISC    Place one system onto the skin daily.   ISOSORBIDE  MONONITRATE (IMDUR ) 30 MG 24 HR TABLET    Take one tablet (30 mg dose) by mouth daily.   JARDIANCE  25 MG TABS TABLET    TAKE 1 TABLET(25 MG) BY MOUTH DAILY   LISINOPRIL -HYDROCHLOROTHIAZIDE  (PRINZIDE ,ZESTORETIC ) 20-12.5 MG PER TABLET    Take one tablet by mouth daily.   METOPROLOL  SUCCINATE  (TOPROL -XL) 100 MG 24 HR TABLET    Take one tablet (100 mg dose) by mouth daily.   MONTELUKAST  (SINGULAIR ) 10 MG TABLET    Take one tablet (10 mg dose) by mouth at bedtime.   NOVOLOG  100 UNIT/ML INJECTION    TO BE USED WITH INSULIN  PUMP. MAX DAILY DOSE UP TO 150 UNITS.   NYSTATIN  (MYCOSTATIN ) POWDER    Apply topically 3 (three) times a day.   PREGABALIN  (LYRICA ) 200 MG CAPSULE    Take one capsule (200 mg dose) by mouth 3 (three) times a day.   SERTRALINE  (ZOLOFT ) 100 MG TABLET    Take one tablet (100 mg dose) by mouth daily.   TRAZODONE  (DESYREL ) 100 MG TABLET    Take one tablet (100 mg dose) by mouth at bedtime.   TRAZODONE  (DESYREL ) 50 MG TABLET    Take one tablet (50 mg dose) by mouth at bedtime.   VENLAFAXINE  HCL (EFFEXOR -XR) 37.5 MG 24 HR CAPSULE    Take one capsule (37.5 mg dose) by mouth daily.    Primary Survey   Exposure   No visible chest trauma.        Review of Systems   Review of Systems  Constitutional: Negative.   HENT: Negative.    Eyes: Negative.   Respiratory: Negative.    Cardiovascular: Negative.   Gastrointestinal:  Positive for abdominal pain.  Endocrine: Negative.   Genitourinary: Negative.   Musculoskeletal: Negative.   Skin: Negative.   Allergic/Immunologic: Negative.   Neurological: Negative.   Hematological: Negative.   Psychiatric/Behavioral: Negative.      Physical Exam   ED Triage Vitals [12/16/23 1446]  BP (!) 110/45  Heart Rate 86  Resp 16  SpO2 100 %  Temp 98.5 F (36.9 C)    Physical Exam  Constitutional: She appears well-developed and well-nourished. She is in good hygiene. She has good hygiene. She does not appear distressed, does not appear ill and no respiratory distress. Not diaphoretic. HENT:  Head: Normocephalic and atraumatic.  Mouth/Throat: Voice normal.  Eyes: Conjunctivae are normal.  Neck: Voice normal.  Cardiovascular: Regular rhythm, normal heart sounds and intact distal pulses.  Pulmonary/Chest: No  respiratory distress. Good air movement. Not tachypneic. Respiratory effort normal and breath sounds normal. No visible chest trauma.  Abdominal: There is no abdominal tenderness. There is no guarding and no rebound. Abdomen not distended. There is no CVA tenderness.  Neurological: She is alert. Moves all extremities equally. Gait normal. She has normal speech.  Skin: Skin is warm. Not diaphoretic.  Psychiatric: She has a normal mood and affect. Her behavior is normal. Judgment and  thought content normal.     ED Course   Lab results:   CBC AND DIFFERENTIAL - Abnormal      Result Value   WBC 7.8     RBC 5.66 (*)    HGB 13.8     HCT 44.4     MCV 78.4 (*)    MCH 24.4 (*)    MCHC 31.1 (*)    Plt Ct 228     RDW SD 40.4     MPV 10.5     NRBC% 0.0     Absolute NRBC Count 0.00     NEUTROPHIL % 67.5     LYMPHOCYTE % 24.7     MONOCYTE % 5.4     Eosinophil % 1.4     BASOPHIL % 0.5     IG% 0.5     ABSOLUTE NEUTROPHIL COUNT 5.28     ABSOLUTE LYMPHOCYTE COUNT 1.93     Absolute Monocyte Count 0.42     Absolute Eosinophil Count 0.11     Absolute Basophil Count 0.04     Absolute Immature Granulocyte Count 0.04 (*)   COMPREHENSIVE METABOLIC PANEL - Abnormal   Na 129 (*)    Potassium 4.4     Cl 96 (*)    CO2 22     AGAP 11     Glucose 605 (*)    BUN 24     Creatinine 1.27 (*)    Ca 9.5     ALK PHOS 94     T Bili 0.3     Total Protein 7.9     Alb 4.1     GLOBULIN 3.8     ALBUMIN/GLOBULIN RATIO 1.1     BUN/CREAT RATIO 18.9     ALT 51 (*)    AST 50 (*)    eGFR 50 (*)    Comment: Normal GFR (glomerular filtration rate) > 60 mL/min/1.73 meters squared, < 60 may include impaired kidney function. Calculation based on the Chronic Kidney Disease Epidemiology Collaboration (CK-EPI)equation refit without adjustment for race.  LIPASE - Abnormal   Lipase 61 (*)   URINALYSIS W/MICRO REFLEX CULTURE - SYMPTOMATIC - Abnormal   Urine Color Yellow     Urine Clarity Clear     Urine Specific  Gravity 1.033 (*)    Comment: Specific gravity not compensated for elevated protein and/or glucose due to over-range (>) results.   Urine pH 6.5     Urine Protein - Dipstick Negative     Urine Glucose >1000 (*)    Urine Ketones 10 (*)    Urine Bilirubin Negative     Urine Blood Negative     Urine Nitrite Negative     Urine Urobilinogen <2     Urine Leukocyte Esterase Negative     UA Microscopic No Micro     Narrative:    Does not meet criteria for reflex to Urine Culture.  POCT GLUCOSE - Abnormal   Glucose, POC 478 (*)    OPERATOR ID [id number]     INSTRUMENT ID [id number]    POCT GLUCOSE  POCT GLUCOSE  POCT GLUCOSE  POCT GLUCOSE  POCT GLUCOSE  POCT GLUCOSE  POCT GLUCOSE  POCT GLUCOSE  POCT GLUCOSE  POCT GLUCOSE  LIGHT BLUE TOP  LAVENDER TOP  MINT GREEN-TOP TUBE (PST GEL/LI HEP)  GOLD SST    Imaging:   CT ABDOMEN PELVIS W IV CONTRAST   Narrative:    CT ABDOMEN AND PELVIS WITH CONTRAST  INDICATION:  Abdominal Pain  COMPARISON:  01/15/2023    TECHNIQUE:  CT imaging of the abdomen and pelvis was performed after the intravenous administration of 85 mL of Isovue-370 iodinated contrast. Dose reduction was utilized (automated exposure control, mA or kV adjustment based on patient size, or iterative image reconstruction). Coronal and sagittal reformatted images were generated and reviewed.  FINDINGS:  - Lower Thorax: Coronary calcifications.   - Liver: Hepatomegaly with steatosis.  - Biliary Tree and Gallbladder: Unremarkable gallbladder. Borderline size of the common bile duct, unchanged.  - Pancreas: Unremarkable.    - Spleen: Probable splenule adjacent to the pancreatic tail, unchanged compared to previous studies.  - Adrenal Glands: Unremarkable.    - Kidneys/Ureters: No hydronephrosis. Similar small cyst of the left anterior interpolar kidney. No hydroureter or ureteric stones.   - Peritoneum/Retroperitoneum: No free fluid.  No pneumoperitoneum. No focal  inflammatory changes.  - Gastrointestinal Tract: No obstruction. Appendectomy.  - Bladder: Within normal limits.    - Reproductive: Unremarkable.   - Vasculature: Normal caliber abdominal aorta.   - Musculoskeletal: No aggressive osseous lesion or acute osseous abnormality. Posterior rod and screw fixation of L4-L5.  - Miscellaneous: N/A    Impression:    Impression: 1.  No acute abnormality involving the abdomen or pelvis. 2.  Similar chronic/ancillary findings as above.  Electronically Signed by: Allison Kobus on 12/16/2023 6:32 PM     ECG: ECG Results   None                                                                        Pre-Sedation Procedures  ED Course as of 12/16/23 1839  Beverley FORBES Ion Documentation  Fri Dec 16, 2023  1646 With insulin -dependent diabetes she does have a glucometer and basal bolus regimen, initial differential diagnosis includes but not limited to gastroenteritis appendicitis pancreatitis cholecystitis small bowel obstruction, less likely to be cardiopulmonary etiology without any chest pain or shortness of breath, she is afebrile and no leukocytosis which is reassuring for unlikelihood of surgical emergency, additionally her family also presented with similar symptoms after recently eating at a restaurant which further suggest that this is probably enteritis.   1724 Removed her CGM prior to arrival in the emergency department her POC glucose is 605, does have mild hyponatremia however when corrected for her glucose her sodium levels are normal, hyperglycemia order set initiated with 12 units of subcutaneous insulin   1835 Patient reports her blood sugars have been ranging between 2 and 400s at home, did get 12 units of insulin  in the emergency department she denies any new or significantly worsening polyphasia polyuria polydipsia.  She took her CGM off to change it today.  Patient is requesting to be  discharged she says her symptoms have fully resolved.  CT imaging does not show any acute abnormalities.    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  Not in DKA.  Return to emergency department precautions discussed is extensively.   Amount and/or Complexity of Data  Reviewed External Data Reviewed: labs, radiology and notes. Radiology: ordered. Discussion of management or test interpretation with external provider(s): History exam labs imaging reviewed past medical history and repeat assessment utilized for discharge decision  Risk OTC drugs. Prescription drug management. Decision regarding hospitalization.          Provider Communication  New Prescriptions   No medications on file    Modified Medications   No medications on file    Discontinued Medications   No medications on file    Clinical Impression Final diagnoses:  Hyperglycemia  Enteritis  Abdominal pain, unspecified abdominal location    ED Disposition     ED Disposition  Discharge   Condition  Stable   Comment  --                 Follow-up Information     Torrence CINDERELLA Barrier, MD. Schedule an appointment as soon as possible for a visit in 2 days.   Specialty: Family Medicine Comments: As needed, If symptoms worsen Contact information: [address] [place] [address] [phone]                  Electronically signed by:       [1] Social History Tobacco Use  Smoking Status Former  . Current packs/day: 0.50  . Types: Cigarettes  Smokeless Tobacco Never  [2] Allergies Allergen Reactions  . Tramadol Confusion/Altered Mental Status    Other  reaction(s): Mental Status Changes (intolerance)  Caused Hallucinations  Caused Hallucinations  . Ibuprofen Other    Not an allergy. Pt stated she can't take it because of current use of ASA and Plavix   . Other Other    Cannot take because she's on Plavix .  . Sulfamethoxazole-Trimethoprim Nausea And Vomiting    History of Colitis   Beverley FORBES Dunk, PA 12/16/23 1839

## 2023-12-20 ENCOUNTER — Telehealth: Payer: Self-pay

## 2023-12-20 NOTE — Telephone Encounter (Signed)
 Insulin  pump clearance sent to Jean Calderon as [name] does not manage her pump.

## 2023-12-20 NOTE — Telephone Encounter (Signed)
 New clearance letter faxed to Jean Calderon as [facility] Jean Calderon returned letter states she doesn't manage patients insulin  pump.

## 2023-12-21 ENCOUNTER — Encounter (HOSPITAL_COMMUNITY): Payer: Self-pay | Admitting: Gastroenterology

## 2023-12-22 NOTE — Telephone Encounter (Signed)
 Spoke with [facility] office and they state the pt is no longer established at their clinic. Called PCP's office to find out if they manage her insulin  pump, they do not.   Found out [employer] manages her insulin  pump.  Phone # [phone]  Fax # [phone]  Clearance letter faxed.

## 2023-12-22 NOTE — Telephone Encounter (Signed)
 Insulin  pump clearance sent to Dr Wyonia Goltz. Confirmed she is the provider managing pts insulin  pump.

## 2023-12-23 NOTE — Progress Notes (Signed)
 Insulin  recommendations previously provided to patient by phone and [facility].  Have requested clinical support assistance in faxing to GI.

## 2023-12-26 ENCOUNTER — Telehealth: Payer: Self-pay

## 2023-12-26 NOTE — Telephone Encounter (Signed)
 Spoke with Maya at [facility] with Dr Lesia' office and advised we still have not received a clearance about the pts insulin  pump. I see Dr Lesia TE where she states the pt is informed on what she needs to do but we have no documentation. Maya states she will send a message high priority as the pts procedure is this Wednesday 10/29. Clinic fax # [phone] given.

## 2023-12-26 NOTE — Telephone Encounter (Signed)
 Procedure:COLON Procedure date: 12/28/23 Procedure location: [facility] Arrival Time: 8:57 Spoke with the patient Y/N: Y Any prep concerns? N  Has the patient obtained the prep from the pharmacy ? Y Do you have a care partner and transportation: Y Any additional concerns? N

## 2023-12-27 NOTE — Anesthesia Preprocedure Evaluation (Signed)
 Anesthesia Evaluation  Patient identified by MRN, date of birth, ID band Patient awake    Reviewed: Allergy & Precautions, NPO status , Patient's Chart, lab work & pertinent test results, reviewed documented beta blocker date and time   Airway Mallampati: III  TM Distance: >3 FB Neck ROM: Full    Dental  (+) Edentulous Upper, Edentulous Lower   Pulmonary sleep apnea , former smoker   Pulmonary exam normal breath sounds clear to auscultation       Cardiovascular hypertension (128/82 preop), Pt. on medications and Pt. on home beta blockers + CAD, + Past MI and + Cardiac Stents (plavix  LD: 5d ago)  Normal cardiovascular exam Rhythm:Regular Rate:Normal  Echo 2023 There is mild concentric left ventricular hypertrophy with normal wall  motion, normal systolic function and ejection fraction  60-65% .  The left ventricular size is normal.  Left ventricular filling pattern is prolonged relaxation.  There was insufficient TR detected to calculate RV systolic pressure.     Neuro/Psych  Headaches PSYCHIATRIC DISORDERS Anxiety Depression       GI/Hepatic Neg liver ROS,GERD  Controlled,,Chronic pancreatitis   Endo/Other  diabetes, Poorly Controlled, Type 2, Oral Hypoglycemic Agents, Insulin  Dependent  Last A1c in 2024 was 14 FS 341 in preop- 10 units insulin  given, per pt insulin  pump stopped working yesterday and she hasn't been checking fingersticks since then. Only on short acting insulin  at home.  Renal/GU negative Renal ROS  negative genitourinary   Musculoskeletal  (+) Arthritis , Osteoarthritis,    Abdominal  (+) + obese  Peds  Hematology negative hematology ROS (+)   Anesthesia Other Findings   Reproductive/Obstetrics negative OB ROS                              Anesthesia Physical Anesthesia Plan  ASA: 3  Anesthesia Plan: MAC   Post-op Pain Management:    Induction:   PONV Risk Score  and Plan: 2 and Propofol  infusion and TIVA  Airway Management Planned: Natural Airway and Simple Face Mask  Additional Equipment: None  Intra-op Plan:   Post-operative Plan:   Informed Consent: I have reviewed the patients History and Physical, chart, labs and discussed the procedure including the risks, benefits and alternatives for the proposed anesthesia with the patient or authorized representative who has indicated his/her understanding and acceptance.       Plan Discussed with: CRNA  Anesthesia Plan Comments:          Anesthesia Quick Evaluation

## 2023-12-27 NOTE — Telephone Encounter (Signed)
 Spoke with Nurse Leita at [facility] with Dr Lesia office in regards to insulin  pump clearance. Advised we still haven't received the clearance for our documentation. Nurse states she will send it over right now. Confirmed fax # [phone].

## 2023-12-28 ENCOUNTER — Telehealth: Payer: Self-pay

## 2023-12-28 ENCOUNTER — Encounter (HOSPITAL_COMMUNITY)
Admission: RE | Disposition: A | Discharge status: Home / Self Care | Payer: Self-pay | Source: Home / Self Care | Attending: Gastroenterology

## 2023-12-28 ENCOUNTER — Other Ambulatory Visit: Payer: Self-pay

## 2023-12-28 ENCOUNTER — Encounter (HOSPITAL_COMMUNITY): Payer: Self-pay | Admitting: Gastroenterology

## 2023-12-28 ENCOUNTER — Ambulatory Visit (HOSPITAL_COMMUNITY)
Admission: RE | Admit: 2023-12-28 | Discharge: 2023-12-28 | Disposition: A | Discharge status: Home / Self Care | Attending: Gastroenterology | Admitting: Gastroenterology

## 2023-12-28 ENCOUNTER — Ambulatory Visit (HOSPITAL_COMMUNITY): Payer: Self-pay | Admitting: Anesthesiology

## 2023-12-28 ENCOUNTER — Encounter (HOSPITAL_COMMUNITY): Payer: Self-pay | Admitting: Anesthesiology

## 2023-12-28 DIAGNOSIS — K2970 Gastritis, unspecified, without bleeding: Secondary | ICD-10-CM

## 2023-12-28 DIAGNOSIS — K219 Gastro-esophageal reflux disease without esophagitis: Secondary | ICD-10-CM

## 2023-12-28 DIAGNOSIS — K861 Other chronic pancreatitis: Secondary | ICD-10-CM

## 2023-12-28 DIAGNOSIS — K862 Cyst of pancreas: Secondary | ICD-10-CM | POA: Diagnosis not present

## 2023-12-28 DIAGNOSIS — K641 Second degree hemorrhoids: Secondary | ICD-10-CM | POA: Diagnosis not present

## 2023-12-28 DIAGNOSIS — K838 Other specified diseases of biliary tract: Secondary | ICD-10-CM

## 2023-12-28 DIAGNOSIS — Z1211 Encounter for screening for malignant neoplasm of colon: Secondary | ICD-10-CM

## 2023-12-28 DIAGNOSIS — K831 Obstruction of bile duct: Secondary | ICD-10-CM

## 2023-12-28 DIAGNOSIS — K2950 Unspecified chronic gastritis without bleeding: Secondary | ICD-10-CM

## 2023-12-28 DIAGNOSIS — K3189 Other diseases of stomach and duodenum: Secondary | ICD-10-CM

## 2023-12-28 DIAGNOSIS — K635 Polyp of colon: Secondary | ICD-10-CM

## 2023-12-28 DIAGNOSIS — Z860100 Personal history of colon polyps, unspecified: Secondary | ICD-10-CM

## 2023-12-28 DIAGNOSIS — K5730 Diverticulosis of large intestine without perforation or abscess without bleeding: Secondary | ICD-10-CM | POA: Diagnosis not present

## 2023-12-28 DIAGNOSIS — I899 Noninfective disorder of lymphatic vessels and lymph nodes, unspecified: Secondary | ICD-10-CM

## 2023-12-28 DIAGNOSIS — K2289 Other specified disease of esophagus: Secondary | ICD-10-CM

## 2023-12-28 HISTORY — PX: COLONOSCOPY: SHX5424

## 2023-12-28 HISTORY — PX: EUS: SHX5427

## 2023-12-28 LAB — GLUCOSE, CAPILLARY
Glucose-Capillary: 331 mg/dL — ABNORMAL HIGH (ref 70–99)
Glucose-Capillary: 331 mg/dL — ABNORMAL HIGH (ref 70–99)

## 2023-12-28 SURGERY — COLONOSCOPY
Anesthesia: Monitor Anesthesia Care

## 2023-12-28 MED ORDER — SODIUM CHLORIDE 0.9 % IV SOLN: INTRAVENOUS | Status: DC

## 2023-12-28 MED ORDER — PROPOFOL 1000 MG/100ML IV EMUL
INTRAVENOUS | Status: AC
  Filled 2023-12-28: qty 100

## 2023-12-28 MED ORDER — PHENYLEPHRINE HCL (PRESSORS) 10 MG/ML IV SOLN
INTRAVENOUS | Status: AC
  Filled 2023-12-28: qty 1

## 2023-12-28 MED ORDER — INSULIN ASPART 100 UNIT/ML IJ SOLN
10.0000 [IU] | Freq: Once | INTRAMUSCULAR | Status: AC
  Administered 2023-12-28: 10 [IU] via SUBCUTANEOUS

## 2023-12-28 MED ORDER — PROPOFOL 500 MG/50ML IV EMUL
INTRAVENOUS | Status: DC | PRN
  Administered 2023-12-28: 50 mg via INTRAVENOUS
  Administered 2023-12-28: 125 ug/kg/min via INTRAVENOUS
  Administered 2023-12-28: 50 mg via INTRAVENOUS
  Administered 2023-12-28: 30 mg via INTRAVENOUS
  Administered 2023-12-28: 20 mg via INTRAVENOUS

## 2023-12-28 MED ORDER — PHENYLEPHRINE HCL-NACL 20-0.9 MG/250ML-% IV SOLN
INTRAVENOUS | Status: DC | PRN
  Administered 2023-12-28: 20 ug/min via INTRAVENOUS

## 2023-12-28 MED ORDER — LIDOCAINE 2% (20 MG/ML) 5 ML SYRINGE
INTRAMUSCULAR | Status: DC | PRN
Start: 2023-12-28 — End: 2023-12-28
  Administered 2023-12-28: 100 mg via INTRAVENOUS

## 2023-12-28 MED ORDER — PHENYLEPHRINE 80 MCG/ML (10ML) SYRINGE FOR IV PUSH (FOR BLOOD PRESSURE SUPPORT)
PREFILLED_SYRINGE | INTRAVENOUS | Status: DC | PRN
  Administered 2023-12-28: 160 ug via INTRAVENOUS
  Administered 2023-12-28 (×2): 80 ug via INTRAVENOUS
  Administered 2023-12-28: 160 ug via INTRAVENOUS

## 2023-12-28 MED ORDER — INSULIN ASPART 100 UNIT/ML IJ SOLN
INTRAMUSCULAR | Status: AC
  Filled 2023-12-28: qty 1

## 2023-12-28 NOTE — Op Note (Signed)
 [facility] Patient Name: Jean Calderon Procedure Date: 12/28/2023 MRN: [id number] Attending MD: Aloha Finner , MD, [id number] Date of Birth: 1970-03-08 CSN: [id number] Age: 53 Admit Type: Outpatient Procedure:                Colonoscopy Indications:              Screening for colorectal malignant neoplasm Providers:                Aloha Finner, MD, Ozell Pouch, Curtistine Bishop, Technician Referring MD:              Medicines:                Monitored Anesthesia Care Complications:            No immediate complications. Estimated Blood Loss:     Estimated blood loss was minimal. Procedure:                Pre-Anesthesia Assessment:                           - Prior to the procedure, a History and Physical                            was performed, and patient medications and                            allergies were reviewed. The patient's tolerance of                            previous anesthesia was also reviewed. The risks                            and benefits of the procedure and the sedation                            options and risks were discussed with the patient.                            All questions were answered, and informed consent                            was obtained. Prior Anticoagulants: The patient                            last took Plavix  (clopidogrel ) 5 days prior to the                            procedure and has taken no anticoagulant or                            antiplatelet agents except for aspirin . ASA Grade  Assessment: III - A patient with severe systemic                            disease. After reviewing the risks and benefits,                            the patient was deemed in satisfactory condition to                            undergo the procedure.                           After obtaining informed consent, the colonoscope                            was  passed under direct vision. Throughout the                            procedure, the patient's blood pressure, pulse, and                            oxygen saturations were monitored continuously. The                            CF-HQ190L ([id number]) Olympus colonoscope was                            introduced through the anus and advanced to the the                            cecum, identified by appendiceal orifice and                            ileocecal valve. The colonoscopy was performed                            without difficulty. The patient tolerated the                            procedure. The quality of the bowel preparation was                            poor. The ileocecal valve, appendiceal orifice, and                            rectum were photographed. Scope In: 10:54:48 AM Scope Out: 11:04:48 AM Scope Withdrawal Time: 0 hours 6 minutes 4 seconds  Total Procedure Duration: 0 hours 10 minutes 0 seconds  Findings:      The digital rectal exam findings include hemorrhoids. Pertinent       negatives include no palpable rectal lesions.      A large amount of semi-liquid stool was found in the entire colon,       interfering with visualization. Lavage of the area was performed using  copious amounts, resulting in incomplete clearance with continued poor       visualization.      Three sessile polyps were found in the recto-sigmoid colon. The polyps       were 2 to 4 mm in size. These polyps were removed with a cold snare.       Resection and retrieval were complete.      Multiple small-mouthed diverticula were found in the recto-sigmoid colon       and sigmoid colon.      Non-bleeding non-thrombosed internal hemorrhoids were found during       retroflexion, during perianal exam and during digital exam. The       hemorrhoids were Grade II (internal hemorrhoids that prolapse but reduce       spontaneously). Impression:               - Stool in the entire examined colon.  Preparation                            of the colon was poor.                           - Hemorrhoids found on digital rectal exam.                           - Three 2 to 4 mm polyps at the recto-sigmoid                            colon, removed with a cold snare. Resected and                            retrieved.                           - Diverticulosis in the recto-sigmoid colon and in                            the sigmoid colon.                           - Non-bleeding non-thrombosed internal hemorrhoids. Moderate Sedation:      Not Applicable - Patient had care per Anesthesia. Recommendation:           - The patient will be observed post-procedure,                            until all discharge criteria are met.                           - Discharge patient to home.                           - Patient has a contact number available for                            emergencies. The signs and symptoms of potential  delayed complications were discussed with the                            patient. Return to normal activities tomorrow.                            Written discharge instructions were provided to the                            patient.                           - High fiber diet.                           - Use FiberCon 1-2 tablets PO daily.                           - May restart Plavix  on 10/31.                           - Continue present medications.                           - Await pathology results.                           - Repeat colonoscopy in 6-12 months because the                            bowel preparation was suboptimal with primary GI.                           - The findings and recommendations were discussed                            with the patient.                           - The findings and recommendations were discussed                            with the patient's family. Procedure Code(s):        --- Professional  ---                           [phone], Colonoscopy, flexible; with removal of                            tumor(s), polyp(s), or other lesion(s) by snare                            technique Diagnosis Code(s):        --- Professional ---                           Z12.11, Encounter for screening  for malignant                            neoplasm of colon                           K64.1, Second degree hemorrhoids                           D12.7, Benign neoplasm of rectosigmoid junction                           K57.30, Diverticulosis of large intestine without                            perforation or abscess without bleeding CPT copyright 2022 American [employer]. All rights reserved. The codes documented in this report are preliminary and upon coder review may  be revised to meet current compliance requirements. Aloha Finner, MD 12/28/2023 11:31:37 AM Number of Addenda: 0

## 2023-12-28 NOTE — Anesthesia Postprocedure Evaluation (Signed)
 Anesthesia Post Note  Patient: [name]  Procedure(s) Performed: COLONOSCOPY ULTRASOUND, UPPER GI TRACT, ENDOSCOPIC     Patient location during evaluation: PACU Anesthesia Type: MAC Level of consciousness: awake and alert Pain management: pain level controlled Vital Signs Assessment: post-procedure vital signs reviewed and stable Respiratory status: spontaneous breathing, nonlabored ventilation and respiratory function stable Cardiovascular status: blood pressure returned to baseline and stable Postop Assessment: no apparent nausea or vomiting Anesthetic complications: no Comments: Emergence delirium, combative on wakeup in PACU- self resolved   No notable events documented.  Last Vitals:  Vitals:   12/28/23 1130 12/28/23 1140  BP: 113/84 127/63  Pulse: 77 79  Resp: 18 19  Temp:    SpO2: 98% 99%    Last Pain:  Vitals:   12/28/23 1130  TempSrc:   PainSc: 5                  Jean Calderon

## 2023-12-28 NOTE — Telephone Encounter (Signed)
 Reminder sent to myself for a recall colonoscopy with a 2 day prep and a repeat MRI/MRCP for a follow up of a pancreatic cyst.

## 2023-12-28 NOTE — Op Note (Signed)
 [facility] Patient Name: Jean Calderon Procedure Date: 12/28/2023 MRN: [id number] Attending MD: Aloha Finner , MD, [id number] Date of Birth: 06-19-1970 CSN: [id number] Age: 53 Admit Type: Outpatient Procedure:                Upper EUS Indications:              Common bile duct dilation (acquired) seen on MRCP,                            Generalized abdominal pain Providers:                Aloha Finner, MD, Ozell Pouch, Curtistine Bishop, Technician Referring MD:              Medicines:                Monitored Anesthesia Care Complications:            No immediate complications. Estimated Blood Loss:     Estimated blood loss was minimal. Procedure:                Pre-Anesthesia Assessment:                           - Prior to the procedure, a History and Physical                            was performed, and patient medications and                            allergies were reviewed. The patient's tolerance of                            previous anesthesia was also reviewed. The risks                            and benefits of the procedure and the sedation                            options and risks were discussed with the patient.                            All questions were answered, and informed consent                            was obtained. Prior Anticoagulants: The patient                            last took Eliquis (apixaban) 2 days and Plavix                             (clopidogrel ) 5 days prior to the procedure. ASA  Grade Assessment: III - A patient with severe                            systemic disease. After reviewing the risks and                            benefits, the patient was deemed in satisfactory                            condition to undergo the procedure.                           After obtaining informed consent, the endoscope was                            passed under  direct vision. Throughout the                            procedure, the patient's blood pressure, pulse, and                            oxygen saturations were monitored continuously. The                            [id number]-[id number] ([id number]) Olympus endoscope was introduced                            through the mouth, and advanced to the second part                            of duodenum. The [id number] ([id number]) Olympus                            endosonoscope was introduced through the mouth, and                            advanced to the duodenum for ultrasound examination                            from the esophagus, stomach and duodenum. The upper                            EUS was accomplished without difficulty. The                            patient tolerated the procedure. Scope In: Scope Out: Findings:      ENDOSCOPIC FINDING: :      No gross lesions were noted in the entire esophagus.      The Z-line was irregular and was found 40 cm from the incisors.      Patchy mildly erythematous mucosa without bleeding was found in the       entire examined stomach. Biopsies were taken with a cold forceps for       histology and Helicobacter pylori testing.  No gross lesions were noted in the duodenal bulb, in the first portion       of the duodenum and in the second portion of the duodenum.      The major papilla was normal.      ENDOSONOGRAPHIC FINDING: :      An anechoic lesion suggestive of a cyst was identified in the pancreatic       tail. It is not in obvious communication with the pancreatic duct. The       lesion measured 6 mm by 5 mm in maximal cross-sectional diameter. There       was a single compartment without septae. The outer wall of the lesion       was not seen. There was no associated mass. There was no internal debris       within the fluid-filled cavity.      There was no sign of other significant endosonographic abnormality in       the pancreatic head (PD = 1.8 mm),  genu of the pancreas (PD = 1.3 -> 0.9       mm), pancreatic body (PD = 0.8 mm) and pancreatic tail (PD = 0.4 mm). No       masses, no calcifications, the pancreatic duct was thin in caliber.      There was dilation in the common bile duct (4.5 -> 6.5 mm) and in the       common hepatic duct (10.3 mm). No stones or sludge were noted.      There was no sign of significant endosonographic abnormality in the       gallbladder.      Endosonographic imaging of the ampulla showed no intramural       (subepithelial) lesion.      Endosonographic imaging in the visualized portion of the liver showed no       mass.      No malignant-appearing lymph nodes were visualized in the celiac region       (level 20), peripancreatic region and porta hepatis region.      The celiac region was visualized. Impression:               EGD impression:                           - No gross lesions in the entire esophagus. Z-line                            irregular, 40 cm from the incisors.                           - Erythematous mucosa in the stomach. Biopsied.                           - No gross lesions in the duodenal bulb, in the                            first portion of the duodenum and in the second                            portion of the duodenum.                           -  Normal major papilla.                           EUS impression:                           - A cystic lesion was seen in the pancreatic tail.                            Tissue has not been obtained. 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                           - There was no other sign of significant pathology                            in the pancreatic head, genu of the pancreas,                            pancreatic body and pancreatic tail.                           - There was dilation in the common bile  duct and in                            the common hepatic duct. No evidence of                            choledocholithiasis.                           - Gallbladder unremarkable.                           - No intramural ampullary lesion noted.                           - No malignant-appearing lymph nodes were                            visualized in the celiac region (level 20),                            peripancreatic region and porta hepatis region. Moderate Sedation:      Not Applicable - Patient had care per Anesthesia. Recommendation:           - Proceed to scheduled colonoscopy.                           - Observe patient's clinical course.                           -  Await path results.                           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                           - Observe patient's clinical course.                           - The findings and recommendations were discussed                            with the patient.                           - The findings and recommendations were discussed                            with the patient's family. Procedure Code(s):        --- Professional ---                           [phone], Esophagogastroduodenoscopy, flexible,                            transoral; with endoscopic ultrasound examination                            limited to the esophagus, stomach or duodenum, and                            adjacent structures                           [id number], Esophagogastroduodenoscopy, flexible,                            transoral; with biopsy, single or multiple Diagnosis Code(s):        --- Professional ---                           K22.89, Other specified disease of esophagus                           K31.89, Other diseases of stomach and duodenum                           K86.2, Cyst of pancreas                           K83.8, Other specified diseases of  biliary tract                           I89.9, Noninfective disorder of lymphatic vessels  and lymph nodes, unspecified                           R10.84, Generalized abdominal pain CPT copyright 2022 American Medical Association. All rights reserved. The codes documented in this report are preliminary and upon coder review may  be revised to meet current compliance requirements. Aloha Finner, MD 12/28/2023 11:26:40 AM Number of Addenda: 0

## 2023-12-28 NOTE — Telephone Encounter (Signed)
 Clearance received from Dr Lesia' office. Pt was already informed on what to do via phone with Dr Lesia per her TE. Pt also states she understands what she is to do.

## 2023-12-28 NOTE — Telephone Encounter (Signed)
-----   Message from Gordy HERO Pyrtle sent at 12/28/2023 11:46 AM EDT ----- Thanks Diann. Alan, can you please place the recall for MRI/MRCP to followup panc cyst and recall colon with 2 day prep in 6 months Thanks JMP ----- Message ----- From: Wilhelmenia Aloha Raddle., MD Sent: 12/28/2023  11:45 AM EDT To: Gordy HERO Starch, MD; Cathryne PARAS May, NP  Team,  EUS and Colonoscopy completed. EUS without concerning findings other than persistent biliary dilation and a TOP cyst.  Recommend 1 year MRI/MRCP to monitor both. Colonoscopy not clean.  Needs another in 6-12 months. Thanks. GM

## 2023-12-28 NOTE — Anesthesia Procedure Notes (Signed)
 Procedure Name: MAC Date/Time: 12/28/2023 10:23 AM  Performed by: Laverda Burnard LABOR, CRNAPre-anesthesia Checklist: Patient identified, Emergency Drugs available, Suction available and Patient being monitored Patient Re-evaluated:Patient Re-evaluated prior to induction Oxygen Delivery Method: Simple face mask Preoxygenation: Pre-oxygenation with 100% oxygen Induction Type: IV induction Airway Equipment and Method: Bite block Placement Confirmation: positive ETCO2 and breath sounds checked- equal and bilateral Dental Injury: Teeth and Oropharynx as per pre-operative assessment

## 2023-12-28 NOTE — H&P (Signed)
 GASTROENTEROLOGY PROCEDURE H&P NOTE   Primary Care Physician: Lyle Wells Louder, FNP  HPI: Jean Calderon is a 53 y.o. female who presents for EGD/EUS for dilated PD and Colonoscopy for history of polyps.  Past Medical History:  Diagnosis Date   CAD (coronary artery disease)    Cervical radiculopathy 08/15/2017   CKD (chronic kidney disease), stage II    COVID-19 01/2022   DM (diabetes mellitus) (HCC)    HTN (hypertension)    Hyperlipidemia    Lumbar radiculopathy 06/21/2019   Last Assessment & Plan: Formatting of this note might be different from the original. Scheduled for laminectomy L5 4/27 Pre-op complete at hospital; labs and ekg reviewed. Scheduled for cardiology eval in one day Medical cleared for procedure by IM evaluation.   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 Severe tendinosis of the supraspinatus tendon.  2. Mild tendinosis of the infraspinatus tendon.  3. Thickening of the inferior joint capsule and intermediate signal  material effacing the normal subcoracoid fat as can be seen with  adhesive capsulitis.    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   Past Surgical History:  Procedure Laterality Date   APPENDECTOMY     BACK SURGERY     CESAREAN SECTION     CORONARY ANGIOPLASTY WITH STENT PLACEMENT     ESOPHAGOGASTRODUODENOSCOPY N/A 10/10/2023   Procedure: EGD (ESOPHAGOGASTRODUODENOSCOPY);  Surgeon: Wilhelmenia Aloha Raddle., MD;  Location: [facility];  Service: Gastroenterology;  Laterality: N/A;   MULTIPLE TOOTH EXTRACTIONS     ROTATOR CUFF REPAIR     Current Facility-Administered Medications  Medication Dose Route Frequency Provider Last Rate Last Admin   0.9 %  sodium chloride  infusion   Intravenous Continuous Mansouraty, Khaiden Segreto Jr., MD 20 mL/hr at 12/28/23 1008 New Bag at 12/28/23 1008    Current  Facility-Administered Medications:    0.9 %  sodium chloride  infusion, , Intravenous, Continuous, Mansouraty, Aloha Raddle., MD, Last Rate: 20 mL/hr at 12/28/23 1008, New Bag at 12/28/23 1008 Allergies  Allergen Reactions   Tramadol     Other reaction(s): Mental Status Changes (intolerance) Caused Hallucinations   Nsaids Other (See Comments)    Cannot take because she's on Plavix .   Other reaction(s): Bleeding (intolerance) Cannot take because she's on Plavix .    Sulfamethoxazole-Trimethoprim Nausea And Vomiting    History of Colitis   Ibuprofen     Not an allergy. Pt stated she can't take it because of current use of ASA and Plavix    Family History  Problem Relation Age of Onset   Heart disease Mother    Heart disease Sister    Social History   Socioeconomic History   Marital status: Married    Spouse name: Not on file   Number of children: 4   Years of education: Not on file   Highest education level: Not on file  Occupational History   Not on file  Tobacco Use   Smoking status: Former    Types: Cigarettes   Smokeless tobacco: Never  Vaping Use   Vaping status: Never Used  Substance and Sexual Activity   Alcohol use: Never   Drug use: Never   Sexual activity: Not on file  Other Topics Concern   Not on file  Social History Narrative   Not on file   Social Drivers of Health   Financial Resource Strain: Not on file  Food Insecurity: Low Risk  (07/15/2023)   Received from [facility]   Hunger Vital Sign    Within the past 12 months, you worried that your food would run out before you got money to buy more: Never true    Within the past 12 months, the food you bought just didn't last and you didn't have money to get more. : Never true  Transportation Needs: No Transportation Needs (07/15/2023)   Received from [facility]    In the past 12 months, has lack of reliable transportation kept you from medical appointments, meetings, work or from  getting things needed for daily living? : No  Physical Activity: Not on file  Stress: No Stress Concern Present (06/06/2022)   Received from [facility] - Occupational Stress Questionnaire    Feeling of Stress : Not at all  Social Connections: Unknown (04/23/2022)   Received from [facility]   Social Network    Social Network: Not on file  Intimate Partner Violence: Not At Risk (12/16/2023)   Received from [facility]   HITS    Over the last 12 months how often did your partner physically hurt you?: Never    Over the last 12 months how often did your partner insult you or talk down to you?: Never    Over the last 12 months how often did your partner threaten you with physical harm?: Never    Over the last 12 months how often did your partner scream or curse at you?: Never    Physical Exam: Today's Vitals   12/21/23 1351 12/28/23 0952  BP:  128/82  Pulse:  94  Resp:  17  Temp:  97.6 F (36.4 C)  TempSrc:  Temporal  SpO2:  97%  Weight: 124 kg 122.5 kg  Height:  5' 9 (1.753 m)  PainSc:  0-No pain   Body mass index is 39.87 kg/m. GEN: NAD EYE: Sclerae anicteric ENT: MMM CV: Non-tachycardic GI: Soft, NT/ND NEURO:  Alert & Oriented x 3  Lab Results: No results for input(s): WBC, HGB, HCT, PLT in the last 72 hours. BMET No results for input(s): NA, K, CL, CO2, GLUCOSE, BUN, CREATININE, CALCIUM  in the last 72 hours. LFT No results for input(s): PROT, ALBUMIN, AST, ALT, ALKPHOS, BILITOT, BILIDIR, IBILI in the last 72 hours. PT/INR No results for input(s): LABPROT, INR in the last 72 hours.   Impression / Plan: This is a 53 y.o.female who presents for EGD/EUS for dilated PD and Colonoscopy for history of polyps.  The risks of an EUS including intestinal perforation, bleeding, infection, aspiration, and medication effects were discussed as was the possibility it may not give a  definitive diagnosis if a biopsy is performed.  When a biopsy of the pancreas is done as part of the EUS, there is an additional risk of pancreatitis at the rate of about 1-2%.  It was explained that procedure related pancreatitis is typically mild, although it can be severe and even life threatening, which is why we do not perform random pancreatic biopsies and only biopsy a lesion/area we feel is concerning enough to warrant the risk.   The risks and benefits of endoscopic evaluation/treatment were discussed with the patient and/or family; these include but are not limited to the risk of perforation, infection, bleeding, missed lesions, lack of diagnosis, severe illness requiring hospitalization, as well as anesthesia and sedation related illnesses.  The patient's history has been  reviewed, patient examined, no change in status, and deemed stable for procedure.  The patient and/or family is agreeable to proceed.    Aloha Finner, MD [facility] Office # [phone]

## 2023-12-28 NOTE — Progress Notes (Signed)
 Pt hyperglycemic pre-operatively. CBG 331. MDA Finucane notified, VO for 10 units insulin  aspart SQ x1. Medication given as ordered in pre-op, verified by this RN and Robie Breed, RN.  Jean VEAR Pouch, RN 12/28/23 10:22 AM

## 2023-12-28 NOTE — Transfer of Care (Signed)
 Immediate Anesthesia Transfer of Care Note  Patient: Jean Calderon  Procedure(s) Performed: COLONOSCOPY ULTRASOUND, UPPER GI TRACT, ENDOSCOPIC  Patient Location: PACU and Endoscopy Unit  Anesthesia Type:MAC  Level of Consciousness: drowsy and patient cooperative  Airway & Oxygen Therapy: Patient Spontanous Breathing and Patient connected to face mask oxygen  Post-op Assessment: Report given to RN and Post -op Vital signs reviewed and stable  Post vital signs: Reviewed and stable  Last Vitals:  Vitals Value Taken Time  BP 133/55 12/28/23 11:20  Temp 36.4 C 12/28/23 11:20  Pulse 80 12/28/23 11:24  Resp 20 12/28/23 11:24  SpO2 98 % 12/28/23 11:24  Vitals shown include unfiled device data.  Last Pain:  Vitals:   12/28/23 1120  TempSrc: Temporal  PainSc:          Complications: No notable events documented.

## 2023-12-28 NOTE — Discharge Instructions (Signed)

## 2023-12-30 LAB — SURGICAL PATHOLOGY

## 2024-01-06 ENCOUNTER — Ambulatory Visit: Payer: Self-pay | Admitting: Gastroenterology

## 2024-01-12 ENCOUNTER — Telehealth: Payer: Self-pay

## 2024-01-12 ENCOUNTER — Other Ambulatory Visit: Payer: Self-pay

## 2024-01-12 DIAGNOSIS — K219 Gastro-esophageal reflux disease without esophagitis: Secondary | ICD-10-CM

## 2024-01-12 MED ORDER — PANTOPRAZOLE SODIUM 40 MG PO TBEC: 40.0000 mg | DELAYED_RELEASE_TABLET | Freq: Two times a day (BID) | ORAL | 1 refills | Status: AC

## 2024-01-12 NOTE — Telephone Encounter (Signed)
 Informed patient that her ALT and AST levels are elevated per labs from 12/16/23 and Elida wants patient to have them repeated in 2 months. Informed patient I will contact her in December (2 months from last labs on 12/16/23) to have her come to our lab. Informed patient to still try to reduce carbs in her diet and exercise as tolerated to lose weight. Patient verbalized understanding. Patient also states she needs a refill of her pantoprazole . Prescription sent to patient's pharmacy.

## 2024-01-12 NOTE — Telephone Encounter (Signed)
 Left message for patient to return my call.

## 2024-01-12 NOTE — Telephone Encounter (Signed)
 Patient returning call.

## 2024-01-12 NOTE — Telephone Encounter (Signed)
-----   Message from Elida CHRISTELLA Shawl sent at 01/12/2024  8:03 AM EST ----- Alan, her AST and ALT levels are mildly elevated per labs 12/16/2023. Pls contact patient and send her back to our lab for a CMP in 2 months and as previously discussed with patient, pls encourage her to reduce the carbohydrates in diet ( ie: Bread, pasta, potatoes, rice and sweets) exercise as tolerated and lose weight to reduce the risk of developing fatty liver related disease.  Thank you ----- Message ----- From: Marcello Alan CROME, CMA Sent: 01/09/2024  10:16 AM EST To: Elida CHRISTELLA Shawl, NP  From result note on 11/03/23, you wanted patient to have repeat hepatic function panel in November. See Care everywhere for Cmet on 12/16/23. Do you still want patient to another hepatic function panel? ----- Message ----- From: Marcello Alan CROME, CMA Sent: 01/09/2024  12:00 AM EST To: Alan CROME Marcello, CMA  Needs 2 month f/u hepatic panel. Place order in Epic.

## 2024-01-31 NOTE — Discharge Summary (Signed)
 [facility]  [facility] Inpatient Discharge Summary  PCP: Torrence CINDERELLA Barrier, MD Discharge Details   Admit date:         01/26/2024 Discharge date:        01/31/2024  Hospital Days:    4 days  Code Status:   Full Code Advanced Directives on file: No Directive        Discharge Diagnoses:  Principal Problem:   Acute chest pain Active Problems:   Type 2 diabetes mellitus with hyperglycemia, with long-term current use of insulin  (*)   Essential hypertension   History of peripheral neuropathy   Anxiety and depression   Pseudohyponatremia   Severe obesity (BMI 35.0-35.9 with comorbidity) (*)   NSTEMI (non-ST elevated myocardial infarction) (*)     Follow-Up Appointments Suggested: Redell GORMAN Shallow, MD [address] [place] [address] [phone]     Torrence CINDERELLA Barrier, MD [address] [place] [address] [phone]     Follow-Up Appointments Already Scheduled: Future Appointments  Date Time Provider Department Center  02/01/2024  2:00 PM Delon Ken Nigh, NP TE END None    Discharge Medications:   Current Discharge Medication List     START taking these medications      Details  aspirin  81 mg chewable tablet Start taking on: February 01, 2024  Chew one tablet (81 mg dose) by mouth daily for 30 days. Quantity: 30 tablet   carvedilol 25 mg tablet Commonly known as: COREG  Take one tablet (25 mg dose) by mouth 2 (two) times daily for 30 days. Quantity: 60 tablet       CONTINUE these medications which have CHANGED      Details  atorvastatin  80 mg tablet Commonly known as: LIPITOR What changed: Another medication with the same name was removed. Continue taking this medication, and follow the directions you see here.  Take one tablet (80 mg dose) by mouth daily for 30 days. Quantity: 30 tablet       CONTINUE these medications which have NOT CHANGED      Details  ACCU-CHEK GUIDE  test strip Generic drug: glucose blood  Use to monitor blood glucose 4 time(s) daily Quantity: 200 each   ACCU-CHEK GUIDE w/Device Kit  Use as directed by provider to monitor blood sugars up to 4 times daily Quantity: 1 kit   ACCU-CHEK SOFTCLIX LANCETS lancets  Use to monitor blood glucose 4 time(s) daily. Quantity: 200 each   albuterol  sulfate HFA 108 (90 Base) MCG/ACT inhaler Commonly known as: PROVENTIL ,VENTOLIN ,PROAIR   SMARTSIG:2 Puff(s) By Mouth Every 4 Hours PRN   clopidogrel  bisulfate 75 mg tablet Commonly known as: PLAVIX   Take one tablet (75 mg dose) by mouth daily for 30 days. Quantity: 30 tablet   cyclobenzaprine  10 mg tablet Commonly known as: FLEXERIL   Take one tablet (10 mg dose) by mouth 3 (three) times a day as needed. Quantity: 20 tablet   DEXCOM G7 SENSOR Misc  Place 1 each onto the skin every 10 days. Quantity: 3 each   DULoxetine  HCl 30 mg capsule Commonly known as: CYMBALTA   Start with 1 tablet once daily for 1 week.  After this, message your doctor through MyChart to let her know about any potential side effects of the medication; if things are going well, we will increase to 1 tab 2 times daily.   fexofenadine  180 mg tablet Commonly known as: ALLEGRA   Take one tablet (180 mg dose) by mouth daily.  fluticasone  propionate 50 mcg/actuation nasal spray Commonly known as: FLONASE   two sprays daily.   hydrOXYzine  HCl 25 mg tablet Commonly known as: ATARAX   Take one tablet (25 mg dose) by mouth 3 (three) times a day.   insulin  lispro (HUMALOG ,ADMELOG ) 100 Units/mL injection  Inject into the skin. Use in insulin  pump up to 60 units d   isosorbide  mononitrate 30 mg 24 hr tablet Commonly known as: IMDUR   Take one tablet (30 mg dose) by mouth daily.   JARDIANCE  25 mg Tabs tablet Generic drug: empagliflozin   TAKE 1 TABLET(25 MG) BY MOUTH DAILY Quantity: 30 tablet   montelukast  10 MG tablet Commonly known as: SINGULAIR   Take one tablet (10 mg  dose) by mouth at bedtime.   NOVOLOG  100 UNIT/ML injection Generic drug: insulin  aspart (NOVOLOG )  TO BE USED WITH INSULIN  PUMP. MAX DAILY DOSE UP TO 150 UNITS. Quantity: 50 mL   nystatin  powder Commonly known as: MYCOSTATIN   Apply topically 3 (three) times a day. Quantity: 60 g   OMNIPOD 5 DEXG7G6 PODS GEN 5 Misc  Place one system onto the skin daily. Quantity: 30 system   pantoprazole  sodium 40 mg tablet Commonly known as: PROTONIX   Take one tablet (40 mg dose) by mouth 2 (two) times daily.   pregabalin  200 MG capsule Commonly known as: LYRICA   Take one capsule (200 mg dose) by mouth 3 (three) times a day.   sertraline  100 mg tablet Commonly known as: ZOLOFT   Take one tablet (100 mg dose) by mouth daily.   SKYRIZI PEN Soaj injection Generic drug: risankizumab-rzaa  Inject 1 mL (150 mg dose) into the skin once.   * traZODone  50 mg tablet Commonly known as: DESYREL   Take one tablet (50 mg dose) by mouth at bedtime.   * traZODone  100 mg tablet Commonly known as: DESYREL   Take one tablet (100 mg dose) by mouth at bedtime.   venlafaxine  HCl 37.5 mg 24 hr capsule Commonly known as: EFFEXOR -XR  Take one capsule (37.5 mg dose) by mouth daily.      * * This list has 2 medication(s) that are the same as other medications prescribed for you. Read the directions carefully, and ask your doctor or other care provider to review them with you.         * You might also be taking other medications not listed above. If you have questions about any of your other medications, talk to the person who prescribed them or your Primary Care Provider.          STOP taking these medications    cloNIDine  0.1 mg tablet Commonly known as: CATAPRES    Estradiol -Norethindrone  Acet 0.5-0.1 MG per tablet   glipiZIDE ER 10 mg 24 hr tablet Commonly known as: GLUCOTROL XL   lisinopril -hydrochlorothiazide  20-12.5 MG per tablet Commonly known as: PRINZIDE ,ZESTORETIC    metoprolol   succinate 100 mg 24 hr tablet Commonly known as: TOPROL -XL        Allergies: Allergies[1]  Consultations this Admission: IP CONSULT TO CARDIOLOGY CONSULT/REFERRAL TO CARDIOPULMONARY REHAB PROGRAM  Procedures/Imaging: Procedure(s) (LRB): Heart Cath-Diagnostic W/Intervention (N/A)    Echocardiogram Complete WO Enhancing Agent  Final Result  Left Ventricle: EF: 60-65%. Quantitative analysis of left ventricular   Global Longitudinal Strain (GLS) imaging is -16.300%, which is abnormal.  .  Right Ventricle: Systolic function is normal.  .  Aortic Valve: The aortic valve is tricuspid. The leaflets are not   thickened and exhibit normal excursion.  .  Mitral Valve: Mitral valve  structure is normal.  .  Tricuspid Valve: Tricuspid valve structure is normal.  .  Pericardium: There is no pericardial effusion.      CT Angio Chest Pulmonary  Final Result  IMPRESSION:  No evidence of pulmonary embolus or other acute process.    Electronically Signed by: Dorn Burkes on 01/27/2024 12:33 AM    XR Chest Ap Portable  Final Result  Impression: No acute airspace opacities are seen.       Electronically Signed by: Rock Croft, MD on 01/26/2024 8:11 PM    Cardiac Catheterization    (Results Pending)    Pertinent Labs:  Cardiac Labs: No results for input(s): CK, CKMB, CTNI, BNP in the last 168 hours. CBC: Recent Labs    Units 01/31/24 0217 01/30/24 0409 01/29/24 0419 01/28/24 0101 01/27/24 0645 01/26/24 1944  WBC thou/mcL 8.1 8.5 8.3 7.5 7.5 6.9  HGB gm/dL 87.3 87.1 87.1 88.2 88.3 12.7  PLT thou/mcL 207 240 220 214 186 239   BMP: Recent Labs    Units 01/31/24 0217 01/30/24 0409 01/29/24 0419 01/28/24 0101 01/26/24 1944  NA mmol/L 132* 134* 135* 138 128*  K mmol/L 4.4 4.0 3.9  --  4.0  CL mmol/L 100 97 99  --  95*  CO2 mmol/L 19* 24 21  --  14*  BUN mg/dL 16 17 19   --  22  CREATININE mg/dL 8.88* 8.99 8.82*  --  8.50*  MAGNESIUM mg/dL 1.7 1.9 1.6   --  1.6   Lipid Panel: Recent Labs    Units 01/27/24 0336  CHOL mg/dL 867  TRIG mg/dL 673*  HDL mg/dL 21*  LDL mg/dL 46   Liver Enzymes: Recent Labs    Units 01/31/24 0217 01/30/24 0409 01/29/24 0419 01/27/24 0336 01/26/24 1944  INR   --   --   --  1.1  --   AST U/L 49* 57* <5  --   --   ALT U/L 55* 52* 47*  --   --   ALKPHOS U/L 79 76 76  --  109  BILITOT mg/dL 0.2 0.3 0.3  --  0.4   Endocrine Panels: Recent Labs    Units 01/31/24 0817 01/31/24 0402 01/31/24 0217 01/31/24 0011 01/30/24 2031 01/30/24 1737 01/30/24 1218 01/30/24 0834 01/30/24 0409 01/30/24 0350 01/29/24 2349 01/29/24 2039 01/27/24 0453 01/27/24 0336  HGBA1C %  --   --   --   --   --   --   --   --   --   --   --   --   --  14.2*  GLUCOSE mg/dL 718* 698* 649* 616* 617* 236* 255* 269* 290* 256* 265* 412*   < >  --    < > = values in this interval not displayed.    Hospital Course   Physicians involved in care during this hospitalization Attending Provider: Grayce CHRISTELLA Ada, MD Attending Provider: Yancy Sorrel, MD Attending Provider: Atlee Abernethy, MD Admitting Provider: Yancy Sorrel, MD Consulting Physician: Yancy Sorrel, MD Consulting Physician: Augusto ONEIDA Kendall, MD Consulting Physician: Atlee Abernethy, MD    Hospital Course:     53 yo F evaluated for chest pain (later found to be ACS and NSTEMI). She was started on and maintained on a IV heparin drip. ASA 81, lipitor 80 and plavix  75 mg were all ordered and administered daily. Cardiology was consulted. They performed cardiac cath.  CARDIAC CATH--> angioplasty, balloon atherotomy and drug coated balloon to the mid LAD and  stenting of the mid to distal LAD on 12/1    Pt's pain did improve after the cardiac cath was completed.   DM2 remained uncontrolled (A1c 14.2). Sugars were managed with lantus  16 units sq bid and lispro SS. Amb referral to endocrinology was also made at discharge. HTN was managed with coreg 25 mg PO bid and imdur  30 mg pO  daily.   She was kept overnight after the cardiac cath and will be discharged on 01/31/2024. Scripts for ASA, lipitor and plavix  were sent to the pharmacy. Referral to outpt cardiology at [facility] was ordered --> pt to follow up w/ Dr. Pietro at [facility] (pt already established).   BP 125/75 (BP Location: Left Lower Arm, Patient Position: Lying)   Pulse 80   Temp 98.8 F (37.1 C) (Oral)   Resp 18   Ht 1.753 m (5' 9.02)   Wt 122.9 kg (270 lb 15.1 oz)   SpO2 95%   BMI 39.99 kg/m   Physical Exam HENT:     Head: Normocephalic.     Mouth/Throat:     Mouth: Mucous membranes are moist.  Cardiovascular:     Rate and Rhythm: Normal rate.  Pulmonary:     Effort: Pulmonary effort is normal.  Abdominal:     Palpations: Abdomen is soft.  Musculoskeletal:        General: Normal range of motion.  Skin:    General: Skin is warm.  Neurological:     Mental Status: She is alert. Mental status is at baseline.  Psychiatric:        Mood and Affect: Mood normal.     Post Hospital Care     Oxygen Orders for Discharge: O2 Device: None (Room air) SpO2: 95 % O2 Flow Rate (L/min): 0 L/min  Diet: Diet and Nourishment Orders (From admission, onward)     Start       01/30/24 1754  Cardiac diet Carb Consistent  Diet effective now       Question:  Additional restrictions:  Answer:  Carb Consistent                 Lines/Drains/Airways: Patient Lines/Drains/Airways Status     Active LDAs     Name Placement date Placement time Site Days   Peripheral IV 20 G Left Forearm 01/26/24  2243  Forearm  4   Peripheral IV 20 G Anterior;Right Forearm 01/27/24  0343  Forearm  4   Continuous Glucose Monitor Dexcom Left;Posterior;Upper Arm 01/27/24  0423  Arm  4               Home Health Orders: DME Orders (From admission, onward)    None      Home Health Agency     None       I spent 35 minutes performing discharge services.   Electronically signed: Zackery Sira,  MD 01/31/2024 / 10:37 AM      [1] Allergies Allergen Reactions  . Tramadol Confusion/Altered Mental Status    Other reaction(s): Mental Status Changes (intolerance)  Caused Hallucinations  Caused Hallucinations  . Ibuprofen Other    Not an allergy. Pt stated she can't take it because of current use of ASA and Plavix   . Other Other    Cannot take because she's on Plavix .  . Sulfamethoxazole-Trimethoprim Nausea And Vomiting    History of Colitis  *Some images could not be shown.

## 2024-02-06 ENCOUNTER — Telehealth: Payer: Self-pay

## 2024-02-06 NOTE — Telephone Encounter (Signed)
 Alan, thank you for letting me know. I reviewed her LFTs done during her hospital admission due to having a NSTEMI.  Please enter hepatic panel recall to be done in 2 months. THX.

## 2024-02-06 NOTE — Telephone Encounter (Signed)
 Lab reminder placed for 2 months.

## 2024-02-06 NOTE — Telephone Encounter (Signed)
-----   Message from [facility] Alan HERO sent at 01/12/2024  2:20 PM EST ----- See phone note from 01/12/24. Patient needs repeat CMet for elevated lft's. Call patient to remind her.

## 2024-02-06 NOTE — Telephone Encounter (Signed)
 Colleen,  You wanted patient to have a repeat CMP 2 months from the last lab work for elevated ALT and AST. Patient was in hospital recently and had a CMP on 01/31/24. Please review. Thanks.

## 2024-02-07 ENCOUNTER — Ambulatory Visit
Admission: EM | Admit: 2024-02-07 | Discharge: 2024-02-07 | Disposition: A | Discharge status: Home / Self Care | Attending: Family Medicine | Admitting: Family Medicine

## 2024-02-07 ENCOUNTER — Other Ambulatory Visit: Payer: Self-pay

## 2024-02-07 DIAGNOSIS — M5442 Lumbago with sciatica, left side: Secondary | ICD-10-CM

## 2024-02-07 DIAGNOSIS — R81 Glycosuria: Secondary | ICD-10-CM

## 2024-02-07 LAB — POCT FASTING CBG KUC MANUAL ENTRY: POCT Glucose (KUC): 214 mg/dL — AB (ref 70–99)

## 2024-02-07 LAB — POCT URINALYSIS DIP (MANUAL ENTRY)
Bilirubin, UA: NEGATIVE
Blood, UA: NEGATIVE
Glucose, UA: >=1000 mg/dL — AB
Ketones, POC UA: NEGATIVE mg/dL
Leukocytes, UA: NEGATIVE
Nitrite, UA: NEGATIVE
Protein Ur, POC: NEGATIVE mg/dL
Spec Grav, UA: 1.01 (ref 1.010–1.025)
Urobilinogen, UA: 0.2 U/dL
pH, UA: 6 (ref 5.0–8.0)

## 2024-02-07 MED ORDER — OXYCODONE-ACETAMINOPHEN 5-325 MG PO TABS: 1.0000 | ORAL_TABLET | Freq: Three times a day (TID) | ORAL | 0 refills | Status: AC | PRN

## 2024-02-07 NOTE — Discharge Instructions (Addendum)
 Patient advised of 1000 glucosuria in UA with CBG of 214.  Advised patient may take Percocet daily or as needed for left sided lower back pain.  Patient advised of sedate of effects of this medication.  Encouraged to increase daily water intake to 64 ounces per day while taking this medication.  Advised if symptoms worsen and/or unresolved please follow-up with your PCP, [facility], or back specialist.

## 2024-02-07 NOTE — ED Triage Notes (Addendum)
 Since yesterday has had lower left back pain that radiates around front. Hurts to urinate. Sts pain is in waves, dull aching. Constant waves. No hematuria. Has had tylenol . Can't take motrin. Tylenol  did not help. Last Tuesday got out of hospital (had 2 coronary stents placed).

## 2024-02-07 NOTE — ED Provider Notes (Signed)
 Jean Calderon    CSN: [id number] Arrival date & time: 02/07/24  0910      History   Chief Complaint Chief Complaint  Patient presents with   Dysuria   Back Pain    HPI Jean Calderon is a 53 y.o. female.   HPI 53 year old female presents with left lower back pain that radiates to her front.  Reports painful to urinate.  UA this morning revealed 1000 of glucose.  PMH significant for T2DM, morbid/severe obesity, lumbar radiculopathy, and CAD (s/p MI).  Patient reports having 2 coronary artery stents placed on Tuesday, 01/31/2024.  Patient is currently on Plavix  and denies any unusual bleeding.  Patient reports numerous back surgeries with rods and pins and is having difficulty walking due to back pain.  Past Medical History:  Diagnosis Date   CAD (coronary artery disease)    Cervical radiculopathy 08/15/2017   CKD (chronic kidney disease), stage II    COVID-19 01/2022   DM (diabetes mellitus) (HCC)    HTN (hypertension)    Hyperlipidemia    Lumbar radiculopathy 06/21/2019   Last Assessment & Plan: Formatting of this note might be different from the original. Scheduled for laminectomy L5 4/27 Pre-op complete at hospital; labs and ekg reviewed. Scheduled for cardiology eval in one day Medical cleared for procedure by IM evaluation.   Myocardial infarction [facility])    Neuropathy    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   1. Severe tendinosis of the supraspinatus tendon.  2. Mild tendinosis of the infraspinatus tendon.  3. Thickening of the inferior joint capsule and intermediate signal  material effacing the normal subcoracoid fat as can be seen with  adhesive capsulitis.    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    Patient Active Problem List   Diagnosis Date Noted   Colon cancer screening 12/28/2023   History of colonic polyps 12/28/2023   Gastritis without bleeding  12/28/2023   Mucosal abnormality of esophagus 10/10/2023   Dysphagia 10/10/2023   Chronic pancreatitis (HCC) 05/13/2023   Elevated LFTs 05/13/2023   Common bile duct dilation 05/13/2023   Chronic midline low back pain 07/19/2022   History of lumbar fusion 07/19/2022   Chronic left SI joint pain 07/19/2022   Numbness and tingling of left leg 07/19/2022   Chronic pain syndrome 07/19/2022   Uncontrolled type 1 diabetes mellitus with hyperglycemia, with long-term current use of insulin  (HCC) 06/23/2022   CKD stage G3b/A3, GFR 30-44 and albumin creatinine ratio >300 mg/g (HCC) 06/06/2022   Transaminitis 06/06/2022   Acute pancreatitis 06/06/2022   Ulnar neuropathy of both upper extremities 12/23/2021   Hyperlipidemia 10/01/2021   Diabetic neuropathy (HCC) 09/30/2021   Meralgia paresthetica, right lower limb 09/30/2021   Gait instability 07/30/2021   Migraine without status migrainosus, not intractable 07/30/2021   Poorly controlled type 2 diabetes mellitus with peripheral neuropathy (HCC) 06/05/2021   Menopausal symptoms 04/15/2021   Anxiety 03/25/2021   CAD (coronary artery disease)    HTN (hypertension)    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   Heart valve disease 01/06/2021   Vitamin B12 deficiency 12/03/2020   Vitamin D deficiency 12/03/2020   Chronic left shoulder pain 11/25/2020   Generalized anxiety disorder 11/25/2020   Psoriasis 01/01/2020   Lumbar radiculopathy 06/21/2019   Uterine leiomyoma 03/06/2019   Depression with anxiety 02/02/2018   Primary insomnia 02/02/2018   Class 2 severe obesity due to excess calories with serious comorbidity  and body mass index (BMI) of 38.0 to 38.9 in adult 10/12/2017   Insulin  pump in place 10/12/2017   Cervical radiculopathy 08/15/2017   Family history of early CAD 07/21/2017   Obstructive sleep apnea syndrome 07/21/2017   Chronic right shoulder pain 05/30/2017   Type 2 diabetes mellitus with mild nonproliferative diabetic  retinopathy without macular edema, bilateral (HCC) 03/18/2017   Congestive heart failure (HCC) 03/16/2017   Other cervical disc degeneration, unspecified cervical region 11/11/2016   Adhesive capsulitis of right shoulder 10/08/2016   GERD (gastroesophageal reflux disease) 07/19/2016   Spinal stenosis of cervical region 12/01/2015   Cervical disc herniation 12/01/2015   Bilateral carpal tunnel syndrome 11/04/2015   Lesion of ulnar nerve, left upper limb 11/04/2015   Stented coronary artery 07/07/2015    Past Surgical History:  Procedure Laterality Date   APPENDECTOMY     BACK SURGERY     CESAREAN SECTION     COLONOSCOPY N/A 12/28/2023   Procedure: COLONOSCOPY;  Surgeon: Jean Calderon., MD;  Location: [facility];  Service: Gastroenterology;  Laterality: N/A;   CORONARY ANGIOPLASTY WITH STENT PLACEMENT     CORONARY STENT PLACEMENT     ESOPHAGOGASTRODUODENOSCOPY N/A 10/10/2023   Procedure: EGD (ESOPHAGOGASTRODUODENOSCOPY);  Surgeon: Jean Calderon., MD;  Location: [facility];  Service: Gastroenterology;  Laterality: N/A;   EUS N/A 12/28/2023   Procedure: ULTRASOUND, UPPER GI TRACT, ENDOSCOPIC;  Surgeon: Jean Calderon., MD;  Location: [facility];  Service: Gastroenterology;  Laterality: N/A;   insulin  pump     MULTIPLE TOOTH EXTRACTIONS     ROTATOR CUFF REPAIR      OB History   No obstetric history on file.      Home Medications    Prior to Admission medications   Medication Sig Start Date End Date Taking? Authorizing Provider  oxyCODONE -acetaminophen  (PERCOCET/ROXICET) 5-325 MG tablet Take 1 tablet by mouth every 8 (eight) hours as needed for severe pain (pain score 7-10). 02/07/24  Yes Jean Sharper, FNP  acetaminophen  (TYLENOL ) 500 MG tablet Take 1 tablet (500 mg total) by mouth every 6 (six) hours as needed. 10/20/23   Jean Calderon, Jean Calderon  N, FNP  amitriptyline  (ELAVIL ) 25 MG tablet Take 1 tablet (25 mg total) by mouth at bedtime. Take an hour  before bed for nerve pain 11/30/23   Jean Jamee Jacob, MD  aspirin  EC 81 MG tablet Take 81 mg by mouth daily.    [provider]  atorvastatin  (LIPITOR) 40 MG tablet TAKE 1 TABLET(40 MG) BY MOUTH DAILY 04/01/23   Rucker, Alethea Y, MD  azelastine (OPTIVAR) 0.05 % ophthalmic solution Apply 1 drop to eye daily. 06/26/23   [provider]  clopidogrel  (PLAVIX ) 75 MG tablet Take 1 tablet (75 mg total) by mouth daily. 09/19/23   Pietro Redell RAMAN, MD  Continuous Blood Gluc Receiver (DEXCOM G7 RECEIVER) DEVI Check sugars daily Dx E11.9 04/05/22   Colette Torrence GRADE, MD  Continuous Blood Gluc Sensor (DEXCOM G7 SENSOR) MISC Check sugars daily Dx E11.9 04/05/22   Colette Torrence GRADE, MD  Continuous Glucose Transmitter (DEXCOM G6 TRANSMITTER) MISC  07/01/22   [provider]  Crisaborole (EUCRISA) 2 % OINT Apply 1 Application topically 2 (two) times daily. 06/11/20   [provider]  Estradiol -Norethindrone  Acet 0.5-0.1 MG tablet Take 1 tablet by mouth daily. 12/06/22   Colette Torrence GRADE, MD  fluticasone  (FLONASE ) 50 MCG/ACT nasal spray Place 1 spray into both nostrils daily. 06/26/23   [provider]  glipiZIDE (GLUCOTROL XL)  5 MG 24 hr tablet Take 5 mg by mouth daily with breakfast.    [provider]  HUMALOG  100 UNIT/ML injection INJECT 25 UNITS INTO THE SKIN 3 TIMES DAILY BEFORE MEALS. USE INSULIN  PUMP 05/12/22   Rucker, Torrence GRADE, MD  Insulin  Aspart, w/Niacinamide, (FIASP ) 100 UNIT/ML SOLN To be used with insulin  pump. 07/08/22   [provider]  Insulin  Disposable Pump (OMNIPOD 5 G6 PODS, GEN 5,) MISC CHANGE POD EVERY OTHER DAY. 06/07/22   Colette Torrence GRADE, MD  isosorbide  mononitrate (IMDUR ) 30 MG 24 hr tablet TAKE 1 TABLET(30 MG) BY MOUTH DAILY 02/21/23   Colette Torrence GRADE, MD  JARDIANCE  25 MG TABS tablet Take 1 tablet (25 mg total) by mouth daily. 06/15/22   Colette Torrence GRADE, MD  ketoconazole  (NIZORAL ) 2 % shampoo Apply to affected areas 3 x a week for 8  weeks 11/18/22   Colette Torrence GRADE, MD  lisinopril -hydrochlorothiazide  (ZESTORETIC ) 20-12.5 MG tablet Take 1 tablet by mouth daily. 10/27/23   [provider]  metoprolol  succinate (TOPROL -XL) 100 MG 24 hr tablet Take 1 tablet (100 mg total) by mouth daily. 11/12/22   Pietro Redell RAMAN, MD  mometasone  (NASONEX ) 50 MCG/ACT nasal spray One spray in each nostril twice a day, use left hand for right nostril, and right hand for left nostril.  Please dispense one bottle. 05/24/22   Colette Torrence GRADE, MD  montelukast  (SINGULAIR ) 10 MG tablet TAKE 1 TABLET(10 MG) BY MOUTH AT BEDTIME 04/25/23   Rucker, Torrence GRADE, MD  nitroGLYCERIN  (NITROSTAT ) 0.4 MG SL tablet Place 1 tablet (0.4 mg total) under the tongue every 5 (five) minutes as needed for chest pain. 11/12/22   Pietro Redell RAMAN, MD  NOVOLOG  100 UNIT/ML injection Inject 100 Units into the skin once. 02/20/23   [provider]  nystatin  ointment (MYCOSTATIN ) Apply 1 Application topically 2 (two) times daily. 07/22/22   Colette Torrence GRADE, MD  olopatadine  (PATADAY ) 0.1 % ophthalmic solution Place 1 drop into both eyes 2 (two) times daily. 05/24/22   Colette Torrence GRADE, MD  pantoprazole  (PROTONIX ) 40 MG tablet Take 1 tablet (40 mg total) by mouth 2 (two) times daily. 01/12/24   Kennedy-Smith, Colleen M, NP  pregabalin  (LYRICA ) 200 MG capsule TAKE 1 CAPSULE BY MOUTH TWICE DAILY 11/09/22   Colette Torrence GRADE, MD  sertraline  (ZOLOFT ) 100 MG tablet Take 100 mg by mouth daily. 10/27/23   [provider]  solifenacin (VESICARE) 10 MG tablet Take 10 mg by mouth daily.    [provider]  sucralfate  (CARAFATE ) 1 g tablet TAKE 1 TABLET(1 GRAM) BY MOUTH FOUR TIMES DAILY AT BEDTIME WITH MEALS 05/09/23   Rucker, Torrence GRADE, MD  topiramate  (TOPAMAX ) 100 MG tablet Take by mouth 2 (two) times daily. 08/17/21   [provider]  traZODone  (DESYREL ) 100 MG tablet TAKE 1 TABLET(100 MG) BY MOUTH AT BEDTIME AS NEEDED FOR SLEEP 02/14/23   Colette Torrence GRADE, MD    Family History Family History  Problem Relation Age of Onset   Heart disease Mother    Heart disease Sister     Social History Social History   Tobacco Use   Smoking status: Former    Types: Cigarettes   Smokeless tobacco: Never  Vaping Use   Vaping status: Never Used  Substance Use Topics   Alcohol use: Never   Drug use: Never     Allergies   Tramadol, Nsaids, Sulfamethoxazole-trimethoprim, and Ibuprofen   Review of Systems Review of Systems  Genitourinary:  Positive for dysuria.  Musculoskeletal:  Positive for back pain.     Physical Exam Triage Vital Signs ED Triage Vitals  Encounter Vitals Group     BP 02/07/24 0915 132/85     Girls Systolic BP Percentile --      Girls Diastolic BP Percentile --      Boys Systolic BP Percentile --      Boys Diastolic BP Percentile --      Pulse Rate 02/07/24 0915 90     Resp 02/07/24 0915 16     Temp 02/07/24 0915 97.9 F (36.6 C)     Temp src --      SpO2 02/07/24 0915 93 %     Weight --      Height --      Head Circumference --      Peak Flow --      Pain Score 02/07/24 0919 10     Pain Loc --      Pain Education --      Exclude from Growth Chart --    No data found.  Updated Vital Signs BP 132/85   Pulse 90   Temp 97.9 F (36.6 C)   Resp 16   SpO2 93%   Visual Acuity Right Eye Distance:   Left Eye Distance:   Bilateral Distance:    Right Eye Near:   Left Eye Near:    Bilateral Near:     Physical Exam Vitals and nursing note reviewed.  Constitutional:      Appearance: Normal appearance. She is obese.  HENT:     Head: Normocephalic.     Mouth/Throat:     Mouth: Mucous membranes are moist.     Pharynx: Oropharynx is clear.  Eyes:     Extraocular Movements: Extraocular movements intact.     Conjunctiva/sclera: Conjunctivae normal.     Pupils: Pupils are equal, round, and reactive to light.  Cardiovascular:     Rate and Rhythm: Normal rate and regular rhythm.     Heart  sounds: Normal heart sounds.  Pulmonary:     Effort: Pulmonary effort is normal.     Breath sounds: Normal breath sounds. No wheezing, rhonchi or rales.  Musculoskeletal:        General: Normal range of motion.     Comments: Lumbar sacral spine (inferior aspect left-sided): Patient reporting pain that radiates from this area to her left groin, no deformity noted  Skin:    General: Skin is warm and dry.  Neurological:     General: No focal deficit present.     Mental Status: She is alert and oriented to person, place, and time. Mental status is at baseline.  Psychiatric:        Mood and Affect: Mood normal.        Behavior: Behavior normal.      UC Treatments / Results  Labs (all labs ordered are listed, but only abnormal results are displayed) Labs Reviewed  POCT URINALYSIS DIP (MANUAL ENTRY) - Abnormal; Notable for the following components:      Result Value   Glucose, UA >=1,000 (*)    All other components within normal limits  POCT FASTING CBG [facility] MANUAL ENTRY - Abnormal; Notable for the following components:   POCT Glucose ([facility]) 214 (*)    All other components within normal limits    EKG   Radiology No results found.  Procedures Procedures (including critical Calderon time)  Medications Ordered in UC  Medications - No data to display  Initial Impression / Assessment and Plan / UC Course  I have reviewed the triage vital signs and the nursing notes.  Pertinent labs & imaging results that were available during my Calderon of the patient were reviewed by me and considered in my medical decision making (see chart for details).     MDM: 1.  Acute midline low back pain with left-sided sciatica-Rx'd Percocet 5/325 mg tablet: Take 1 tablet every 8 hours, as needed for severe left-sided low back pain; 2.  Glucosuria-Patient advised of 1000 glucosuria in UA with CBG of 214.  Advised patient may take Percocet daily or as needed for left sided lower back pain.  Patient advised of  sedate of effects of this medication.  Encouraged to increase daily water intake to 64 ounces per day while taking this medication.  Advised if symptoms worsen and/or unresolved please follow-up with your PCP, [facility], or back specialist.  Patient discharged home, hemodynamically stable. Final Clinical Impressions(s) / UC Diagnoses   Final diagnoses:  Glucosuria  Acute midline low back pain with left-sided sciatica     Discharge Instructions      Patient advised of 1000 glucosuria in UA with CBG of 214.  Advised patient may take Percocet daily or as needed for left sided lower back pain.  Patient advised of sedate of effects of this medication.  Encouraged to increase daily water intake to 64 ounces per day while taking this medication.  Advised if symptoms worsen and/or unresolved please follow-up with your PCP,  orthopedics, or back specialist.     ED Prescriptions     Medication Sig Dispense Auth. Provider   oxyCODONE -acetaminophen  (PERCOCET/ROXICET) 5-325 MG tablet Take 1 tablet by mouth every 8 (eight) hours as needed for severe pain (pain score 7-10). 15 tablet Ravonda Brecheen, FNP      I have reviewed the PDMP during this encounter.   Jean Sharper, FNP 02/07/24 1043

## 2024-02-08 ENCOUNTER — Telehealth: Payer: Self-pay

## 2024-02-16 ENCOUNTER — Encounter: Payer: Self-pay | Admitting: Cardiology

## 2024-02-16 ENCOUNTER — Emergency Department (HOSPITAL_COMMUNITY)

## 2024-02-16 ENCOUNTER — Inpatient Hospital Stay (HOSPITAL_COMMUNITY)
Admission: EM | Admit: 2024-02-16 | Discharge: 2024-02-18 | DRG: 281 | Disposition: A | Discharge status: Home / Self Care | Source: Ambulatory Visit | Attending: Internal Medicine | Admitting: Internal Medicine

## 2024-02-16 ENCOUNTER — Encounter (HOSPITAL_COMMUNITY): Payer: Self-pay | Admitting: Physician Assistant

## 2024-02-16 DIAGNOSIS — Z885 Allergy status to narcotic agent status: Secondary | ICD-10-CM

## 2024-02-16 DIAGNOSIS — I214 Non-ST elevation (NSTEMI) myocardial infarction: Secondary | ICD-10-CM | POA: Diagnosis present

## 2024-02-16 DIAGNOSIS — F32A Depression, unspecified: Secondary | ICD-10-CM | POA: Diagnosis present

## 2024-02-16 DIAGNOSIS — E1142 Type 2 diabetes mellitus with diabetic polyneuropathy: Secondary | ICD-10-CM | POA: Diagnosis present

## 2024-02-16 DIAGNOSIS — E1051 Type 1 diabetes mellitus with diabetic peripheral angiopathy without gangrene: Secondary | ICD-10-CM | POA: Diagnosis present

## 2024-02-16 DIAGNOSIS — E1065 Type 1 diabetes mellitus with hyperglycemia: Secondary | ICD-10-CM | POA: Diagnosis present

## 2024-02-16 DIAGNOSIS — I10 Essential (primary) hypertension: Secondary | ICD-10-CM | POA: Diagnosis not present

## 2024-02-16 DIAGNOSIS — E66812 Obesity, class 2: Secondary | ICD-10-CM | POA: Diagnosis present

## 2024-02-16 DIAGNOSIS — K7581 Nonalcoholic steatohepatitis (NASH): Secondary | ICD-10-CM | POA: Diagnosis present

## 2024-02-16 DIAGNOSIS — Z87891 Personal history of nicotine dependence: Secondary | ICD-10-CM

## 2024-02-16 DIAGNOSIS — Z7902 Long term (current) use of antithrombotics/antiplatelets: Secondary | ICD-10-CM | POA: Diagnosis not present

## 2024-02-16 DIAGNOSIS — E1165 Type 2 diabetes mellitus with hyperglycemia: Secondary | ICD-10-CM | POA: Diagnosis not present

## 2024-02-16 DIAGNOSIS — Z794 Long term (current) use of insulin: Secondary | ICD-10-CM | POA: Diagnosis not present

## 2024-02-16 DIAGNOSIS — I129 Hypertensive chronic kidney disease with stage 1 through stage 4 chronic kidney disease, or unspecified chronic kidney disease: Secondary | ICD-10-CM | POA: Diagnosis present

## 2024-02-16 DIAGNOSIS — F419 Anxiety disorder, unspecified: Secondary | ICD-10-CM | POA: Diagnosis present

## 2024-02-16 DIAGNOSIS — R7989 Other specified abnormal findings of blood chemistry: Secondary | ICD-10-CM | POA: Diagnosis present

## 2024-02-16 DIAGNOSIS — L299 Pruritus, unspecified: Secondary | ICD-10-CM | POA: Diagnosis not present

## 2024-02-16 DIAGNOSIS — Z23 Encounter for immunization: Secondary | ICD-10-CM

## 2024-02-16 DIAGNOSIS — R0789 Other chest pain: Secondary | ICD-10-CM | POA: Diagnosis present

## 2024-02-16 DIAGNOSIS — Z8616 Personal history of COVID-19: Secondary | ICD-10-CM

## 2024-02-16 DIAGNOSIS — N1831 Chronic kidney disease, stage 3a: Secondary | ICD-10-CM | POA: Diagnosis present

## 2024-02-16 DIAGNOSIS — Z886 Allergy status to analgesic agent status: Secondary | ICD-10-CM

## 2024-02-16 DIAGNOSIS — Z9861 Coronary angioplasty status: Secondary | ICD-10-CM | POA: Diagnosis not present

## 2024-02-16 DIAGNOSIS — T82855D Stenosis of coronary artery stent, subsequent encounter: Secondary | ICD-10-CM

## 2024-02-16 DIAGNOSIS — Z818 Family history of other mental and behavioral disorders: Secondary | ICD-10-CM

## 2024-02-16 DIAGNOSIS — I200 Unstable angina: Principal | ICD-10-CM

## 2024-02-16 DIAGNOSIS — E871 Hypo-osmolality and hyponatremia: Secondary | ICD-10-CM | POA: Diagnosis present

## 2024-02-16 DIAGNOSIS — E785 Hyperlipidemia, unspecified: Secondary | ICD-10-CM | POA: Diagnosis present

## 2024-02-16 DIAGNOSIS — I2510 Atherosclerotic heart disease of native coronary artery without angina pectoris: Secondary | ICD-10-CM | POA: Diagnosis present

## 2024-02-16 DIAGNOSIS — E1042 Type 1 diabetes mellitus with diabetic polyneuropathy: Secondary | ICD-10-CM | POA: Diagnosis present

## 2024-02-16 DIAGNOSIS — Z79899 Other long term (current) drug therapy: Secondary | ICD-10-CM

## 2024-02-16 DIAGNOSIS — G4733 Obstructive sleep apnea (adult) (pediatric): Secondary | ICD-10-CM | POA: Diagnosis present

## 2024-02-16 DIAGNOSIS — E669 Obesity, unspecified: Secondary | ICD-10-CM | POA: Diagnosis not present

## 2024-02-16 DIAGNOSIS — E1022 Type 1 diabetes mellitus with diabetic chronic kidney disease: Secondary | ICD-10-CM | POA: Diagnosis present

## 2024-02-16 DIAGNOSIS — Z7984 Long term (current) use of oral hypoglycemic drugs: Secondary | ICD-10-CM

## 2024-02-16 DIAGNOSIS — I509 Heart failure, unspecified: Secondary | ICD-10-CM

## 2024-02-16 DIAGNOSIS — R079 Chest pain, unspecified: Secondary | ICD-10-CM | POA: Diagnosis present

## 2024-02-16 DIAGNOSIS — Z9641 Presence of insulin pump (external) (internal): Secondary | ICD-10-CM | POA: Diagnosis present

## 2024-02-16 DIAGNOSIS — Z8249 Family history of ischemic heart disease and other diseases of the circulatory system: Secondary | ICD-10-CM

## 2024-02-16 DIAGNOSIS — Z7982 Long term (current) use of aspirin: Secondary | ICD-10-CM

## 2024-02-16 DIAGNOSIS — K219 Gastro-esophageal reflux disease without esophagitis: Secondary | ICD-10-CM | POA: Diagnosis present

## 2024-02-16 DIAGNOSIS — Z6838 Body mass index (BMI) 38.0-38.9, adult: Secondary | ICD-10-CM

## 2024-02-16 DIAGNOSIS — Z6839 Body mass index (BMI) 39.0-39.9, adult: Secondary | ICD-10-CM

## 2024-02-16 DIAGNOSIS — Z882 Allergy status to sulfonamides status: Secondary | ICD-10-CM

## 2024-02-16 DIAGNOSIS — R072 Precordial pain: Secondary | ICD-10-CM | POA: Diagnosis not present

## 2024-02-16 DIAGNOSIS — Z955 Presence of coronary angioplasty implant and graft: Secondary | ICD-10-CM

## 2024-02-16 LAB — BASIC METABOLIC PANEL WITH GFR
Anion gap: 15 (ref 5–15)
BUN: 23 mg/dL — ABNORMAL HIGH (ref 6–20)
CO2: 20 mmol/L — ABNORMAL LOW (ref 22–32)
Calcium: 10.2 mg/dL (ref 8.9–10.3)
Chloride: 99 mmol/L (ref 98–111)
Creatinine, Ser: 1.16 mg/dL — ABNORMAL HIGH (ref 0.44–1.00)
GFR, Estimated: 55 mL/min — ABNORMAL LOW (ref >60)
Glucose, Bld: 261 mg/dL — ABNORMAL HIGH (ref 70–99)
Potassium: 4.2 mmol/L (ref 3.5–5.1)
Sodium: 133 mmol/L — ABNORMAL LOW (ref 135–145)

## 2024-02-16 LAB — CBC
HCT: 44.3 % (ref 36.0–46.0)
HCT: 42.4 % (ref 36.0–46.0)
Hemoglobin: 14.3 g/dL (ref 12.0–15.0)
Hemoglobin: 13.4 g/dL (ref 12.0–15.0)
MCH: 26.1 pg (ref 26.0–34.0)
MCH: 25.7 pg — ABNORMAL LOW (ref 26.0–34.0)
MCHC: 32.3 g/dL (ref 30.0–36.0)
MCHC: 31.6 g/dL (ref 30.0–36.0)
MCV: 81 fL (ref 80.0–100.0)
MCV: 81.2 fL (ref 80.0–100.0)
Platelets: 273 K/uL (ref 150–400)
Platelets: 292 K/uL (ref 150–400)
RBC: 5.47 MIL/uL — ABNORMAL HIGH (ref 3.87–5.11)
RBC: 5.22 MIL/uL — ABNORMAL HIGH (ref 3.87–5.11)
RDW: 14.2 % (ref 11.5–15.5)
RDW: 14.5 % (ref 11.5–15.5)
WBC: 8.4 K/uL (ref 4.0–10.5)
WBC: 8.8 K/uL (ref 4.0–10.5)
nRBC: 0 % (ref 0.0–0.2)
nRBC: 0 % (ref 0.0–0.2)

## 2024-02-16 LAB — CREATININE, SERUM
Creatinine, Ser: 1.18 mg/dL — ABNORMAL HIGH (ref 0.44–1.00)
GFR, Estimated: 54 mL/min — ABNORMAL LOW (ref >60)

## 2024-02-16 LAB — HIV ANTIBODY (ROUTINE TESTING W REFLEX): HIV Screen 4th Generation wRfx: NONREACTIVE

## 2024-02-16 LAB — CBG MONITORING, ED: Glucose-Capillary: 123 mg/dL — ABNORMAL HIGH (ref 70–99)

## 2024-02-16 LAB — TROPONIN T, HIGH SENSITIVITY
Troponin T High Sensitivity: <15 ng/L (ref 0–19)
Troponin T High Sensitivity: <15 ng/L (ref 0–19)

## 2024-02-16 MED ORDER — ONDANSETRON HCL 4 MG/2ML IJ SOLN
4.0000 mg | Freq: Four times a day (QID) | INTRAMUSCULAR | Status: DC | PRN
  Administered 2024-02-17: 4 mg via INTRAVENOUS
  Filled 2024-02-16 (×2): qty 2

## 2024-02-16 MED ORDER — HYDRALAZINE HCL 20 MG/ML IJ SOLN: 10.0000 mg | Freq: Four times a day (QID) | INTRAMUSCULAR | Status: DC | PRN

## 2024-02-16 MED ORDER — ASPIRIN 81 MG PO TBEC
81.0000 mg | DELAYED_RELEASE_TABLET | Freq: Every day | ORAL | Status: DC
  Administered 2024-02-16 – 2024-02-18 (×3): 81 mg via ORAL
  Filled 2024-02-16 (×3): qty 1

## 2024-02-16 MED ORDER — LACTATED RINGERS IV BOLUS
500.0000 mL | Freq: Once | INTRAVENOUS | Status: AC
  Administered 2024-02-16: 16:00:00 500 mL via INTRAVENOUS

## 2024-02-16 MED ORDER — ACETAMINOPHEN 500 MG PO TABS
500.0000 mg | ORAL_TABLET | Freq: Four times a day (QID) | ORAL | Status: DC | PRN
  Administered 2024-02-17: 500 mg via ORAL
  Filled 2024-02-16: qty 1

## 2024-02-16 MED ORDER — FENTANYL CITRATE (PF) 50 MCG/ML IJ SOSY: 50.0000 ug | PREFILLED_SYRINGE | Freq: Once | INTRAMUSCULAR | Status: DC

## 2024-02-16 MED ORDER — SUCRALFATE 1 G PO TABS: 1.0000 g | ORAL_TABLET | Freq: Four times a day (QID) | ORAL | Status: DC

## 2024-02-16 MED ORDER — IPRATROPIUM BROMIDE 0.02 % IN SOLN
0.5000 mg | Freq: Four times a day (QID) | RESPIRATORY_TRACT | Status: DC
  Administered 2024-02-16 – 2024-02-17 (×3): 0.5 mg via RESPIRATORY_TRACT
  Filled 2024-02-16 (×5): qty 2.5

## 2024-02-16 MED ORDER — EMPAGLIFLOZIN 25 MG PO TABS
25.0000 mg | ORAL_TABLET | Freq: Every day | ORAL | Status: DC
  Administered 2024-02-17 – 2024-02-18 (×2): 25 mg via ORAL
  Filled 2024-02-16 (×2): qty 1

## 2024-02-16 MED ORDER — PANTOPRAZOLE SODIUM 40 MG PO TBEC
40.0000 mg | DELAYED_RELEASE_TABLET | Freq: Two times a day (BID) | ORAL | Status: DC
  Administered 2024-02-16 – 2024-02-18 (×4): 40 mg via ORAL
  Filled 2024-02-16 (×4): qty 1

## 2024-02-16 MED ORDER — INSULIN ASPART 100 UNIT/ML IJ SOLN
15.0000 [IU] | Freq: Three times a day (TID) | INTRAMUSCULAR | Status: DC
  Administered 2024-02-17 – 2024-02-18 (×5): 15 [IU] via SUBCUTANEOUS
  Filled 2024-02-16 (×5): qty 15

## 2024-02-16 MED ORDER — CLOPIDOGREL BISULFATE 75 MG PO TABS
75.0000 mg | ORAL_TABLET | Freq: Every day | ORAL | Status: DC
  Administered 2024-02-16 – 2024-02-18 (×3): 75 mg via ORAL
  Filled 2024-02-16 (×3): qty 1

## 2024-02-16 MED ORDER — ISOSORBIDE MONONITRATE ER 30 MG PO TB24: 30.0000 mg | ORAL_TABLET | Freq: Once | ORAL | Status: DC

## 2024-02-16 MED ORDER — HEPARIN SODIUM (PORCINE) 5000 UNIT/ML IJ SOLN
5000.0000 [IU] | Freq: Three times a day (TID) | INTRAMUSCULAR | Status: DC
  Administered 2024-02-16 – 2024-02-18 (×5): 5000 [IU] via SUBCUTANEOUS
  Filled 2024-02-16 (×5): qty 1

## 2024-02-16 MED ORDER — LISINOPRIL-HYDROCHLOROTHIAZIDE 20-12.5 MG PO TABS: 1.0000 | ORAL_TABLET | Freq: Every day | ORAL | Status: DC

## 2024-02-16 MED ORDER — MORPHINE SULFATE (PF) 2 MG/ML IV SOLN
2.0000 mg | INTRAVENOUS | Status: DC | PRN
  Administered 2024-02-16 – 2024-02-17 (×2): 2 mg via INTRAVENOUS
  Filled 2024-02-16 (×2): qty 1

## 2024-02-16 MED ORDER — LISINOPRIL 20 MG PO TABS
20.0000 mg | ORAL_TABLET | Freq: Every day | ORAL | Status: DC
  Administered 2024-02-17 – 2024-02-18 (×2): 20 mg via ORAL
  Filled 2024-02-16 (×2): qty 1

## 2024-02-16 MED ORDER — METOPROLOL SUCCINATE ER 25 MG PO TB24
100.0000 mg | ORAL_TABLET | Freq: Every day | ORAL | Status: DC
  Administered 2024-02-17 – 2024-02-18 (×2): 100 mg via ORAL
  Filled 2024-02-16 (×2): qty 1

## 2024-02-16 MED ORDER — MORPHINE SULFATE (PF) 4 MG/ML IV SOLN
4.0000 mg | Freq: Once | INTRAVENOUS | Status: AC
  Administered 2024-02-16: 16:00:00 4 mg via INTRAVENOUS
  Filled 2024-02-16: qty 1

## 2024-02-16 MED ORDER — INSULIN LISPRO 100 UNIT/ML IJ SOLN: 15.0000 [IU] | Freq: Three times a day (TID) | INTRAMUSCULAR | Status: DC

## 2024-02-16 MED ORDER — FENTANYL CITRATE (PF) 50 MCG/ML IJ SOSY
50.0000 ug | PREFILLED_SYRINGE | Freq: Once | INTRAMUSCULAR | Status: AC
  Administered 2024-02-16: 18:00:00 50 ug via INTRAVENOUS
  Filled 2024-02-16: qty 1

## 2024-02-16 MED ORDER — ISOSORBIDE MONONITRATE ER 30 MG PO TB24
60.0000 mg | ORAL_TABLET | Freq: Once | ORAL | Status: AC
  Administered 2024-02-16: 19:00:00 60 mg via ORAL
  Filled 2024-02-16: qty 2

## 2024-02-16 MED ORDER — HYDROCHLOROTHIAZIDE 12.5 MG PO TABS
12.5000 mg | ORAL_TABLET | Freq: Every day | ORAL | Status: DC
  Administered 2024-02-17 – 2024-02-18 (×2): 12.5 mg via ORAL
  Filled 2024-02-16 (×2): qty 1

## 2024-02-16 MED ORDER — ATORVASTATIN CALCIUM 40 MG PO TABS
40.0000 mg | ORAL_TABLET | Freq: Every day | ORAL | Status: DC
  Administered 2024-02-16 – 2024-02-18 (×3): 40 mg via ORAL
  Filled 2024-02-16 (×3): qty 1

## 2024-02-16 MED ORDER — ISOSORBIDE MONONITRATE ER 60 MG PO TB24
60.0000 mg | ORAL_TABLET | Freq: Every day | ORAL | Status: DC
  Administered 2024-02-17 – 2024-02-18 (×2): 60 mg via ORAL
  Filled 2024-02-16: qty 1
  Filled 2024-02-16: qty 2

## 2024-02-16 MED ORDER — ONDANSETRON HCL 4 MG PO TABS: 4.0000 mg | ORAL_TABLET | Freq: Four times a day (QID) | ORAL | Status: DC | PRN

## 2024-02-16 MED ORDER — INSULIN ASPART 100 UNIT/ML IJ SOLN
0.0000 [IU] | INTRAMUSCULAR | Status: DC
  Administered 2024-02-16: 20:00:00 1 [IU] via SUBCUTANEOUS
  Administered 2024-02-17: 9 [IU] via SUBCUTANEOUS
  Administered 2024-02-17 (×2): 7 [IU] via SUBCUTANEOUS
  Administered 2024-02-17: 9 [IU] via SUBCUTANEOUS
  Administered 2024-02-17 (×2): 7 [IU] via SUBCUTANEOUS
  Administered 2024-02-18 (×2): 3 [IU] via SUBCUTANEOUS
  Administered 2024-02-18: 5 [IU] via SUBCUTANEOUS
  Administered 2024-02-18: 3 [IU] via SUBCUTANEOUS
  Filled 2024-02-16 (×2): qty 7
  Filled 2024-02-16: qty 5
  Filled 2024-02-16: qty 7
  Filled 2024-02-16: qty 9
  Filled 2024-02-16: qty 3
  Filled 2024-02-16: qty 9
  Filled 2024-02-16: qty 3
  Filled 2024-02-16: qty 1
  Filled 2024-02-16: qty 3
  Filled 2024-02-16: qty 7

## 2024-02-16 NOTE — ED Provider Notes (Signed)
 [place] EMERGENCY DEPARTMENT AT [facility] Provider Note   CSN: [id number] Arrival date & time: 02/16/24  1239     Patient presents with: Chest Pain   Jean Calderon is a 53 y.o. female.   Chest Pain Patient is a 53 year old female presenting today for chest pain and shortness of breath that started last night and progressively worse this morning, worse on exertion.  Noted to have recently underwent 5 cardiac stents on 01/30/2024 for NSTEMI.  Has been taking Plavix  with no missed doses.  Notes that the pain radiates down left arm and epigastric region.  Notes that pain is even persistent at rest.  Reportedly took 2 nitro before arrival with some mild resolution of symptoms.   Denies fever, vision changes, cough, congestion, nausea, vomiting, diarrhea, melena, hematochezia, dysuria, vaginal bleeding, lower leg swelling.  Prior to Admission medications  Medication Sig Start Date End Date Taking? Authorizing Provider  acetaminophen  (TYLENOL ) 500 MG tablet Take 1 tablet (500 mg total) by mouth every 6 (six) hours as needed. 10/20/23   Dreama, [place]  N, FNP  amitriptyline  (ELAVIL ) 25 MG tablet Take 1 tablet (25 mg total) by mouth at bedtime. Take an hour before bed for nerve pain 11/30/23   Maranda Jamee Jacob, MD  aspirin  EC 81 MG tablet Take 81 mg by mouth daily.    [provider]  atorvastatin  (LIPITOR) 40 MG tablet TAKE 1 TABLET(40 MG) BY MOUTH DAILY 04/01/23   Rucker, Alethea Y, MD  azelastine (OPTIVAR) 0.05 % ophthalmic solution Apply 1 drop to eye daily. 06/26/23   [provider]  clopidogrel  (PLAVIX ) 75 MG tablet Take 1 tablet (75 mg total) by mouth daily. 09/19/23   Pietro Redell RAMAN, MD  Continuous Blood Gluc Receiver (DEXCOM G7 RECEIVER) DEVI Check sugars daily Dx E11.9 04/05/22   Colette Torrence GRADE, MD  Continuous Blood Gluc Sensor (DEXCOM G7 SENSOR) MISC Check sugars daily Dx E11.9 04/05/22   Colette Torrence GRADE, MD  Continuous Glucose Transmitter (DEXCOM  G6 TRANSMITTER) MISC  07/01/22   [provider]  Crisaborole (EUCRISA) 2 % OINT Apply 1 Application topically 2 (two) times daily. 06/11/20   [provider]  Estradiol -Norethindrone  Acet 0.5-0.1 MG tablet Take 1 tablet by mouth daily. 12/06/22   Colette Torrence GRADE, MD  fluticasone  (FLONASE ) 50 MCG/ACT nasal spray Place 1 spray into both nostrils daily. 06/26/23   [provider]  glipiZIDE (GLUCOTROL XL) 5 MG 24 hr tablet Take 5 mg by mouth daily with breakfast.    [provider]  HUMALOG  100 UNIT/ML injection INJECT 25 UNITS INTO THE SKIN 3 TIMES DAILY BEFORE MEALS. USE INSULIN  PUMP 05/12/22   Colette Torrence GRADE, MD  Insulin  Aspart, w/Niacinamide, (FIASP ) 100 UNIT/ML SOLN To be used with insulin  pump. 07/08/22   [provider]  Insulin  Disposable Pump (OMNIPOD 5 G6 PODS, GEN 5,) MISC CHANGE POD EVERY OTHER DAY. 06/07/22   Colette Torrence GRADE, MD  isosorbide  mononitrate (IMDUR ) 30 MG 24 hr tablet TAKE 1 TABLET(30 MG) BY MOUTH DAILY 02/21/23   Colette Torrence GRADE, MD  JARDIANCE  25 MG TABS tablet Take 1 tablet (25 mg total) by mouth daily. 06/15/22   Colette Torrence GRADE, MD  ketoconazole  (NIZORAL ) 2 % shampoo Apply to affected areas 3 x a week for 8 weeks 11/18/22   Colette Torrence GRADE, MD  lisinopril -hydrochlorothiazide  (ZESTORETIC ) 20-12.5 MG tablet Take 1 tablet by mouth daily. 10/27/23   [provider]  metoprolol  succinate (TOPROL -XL) 100 MG 24 hr  tablet Take 1 tablet (100 mg total) by mouth daily. 11/12/22   Pietro Redell RAMAN, MD  mometasone  (NASONEX ) 50 MCG/ACT nasal spray One spray in each nostril twice a day, use left hand for right nostril, and right hand for left nostril.  Please dispense one bottle. 05/24/22   Colette Torrence GRADE, MD  montelukast  (SINGULAIR ) 10 MG tablet TAKE 1 TABLET(10 MG) BY MOUTH AT BEDTIME 04/25/23   Rucker, Torrence GRADE, MD  nitroGLYCERIN  (NITROSTAT ) 0.4 MG SL tablet Place 1 tablet (0.4 mg total) under the tongue every 5 (five) minutes as  needed for chest pain. 11/12/22   Pietro Redell RAMAN, MD  NOVOLOG  100 UNIT/ML injection Inject 100 Units into the skin once. 02/20/23   [provider]  nystatin  ointment (MYCOSTATIN ) Apply 1 Application topically 2 (two) times daily. 07/22/22   Colette Torrence GRADE, MD  olopatadine  (PATADAY ) 0.1 % ophthalmic solution Place 1 drop into both eyes 2 (two) times daily. 05/24/22   Colette Torrence GRADE, MD  oxyCODONE -acetaminophen  (PERCOCET/ROXICET) 5-325 MG tablet Take 1 tablet by mouth every 8 (eight) hours as needed for severe pain (pain score 7-10). 02/07/24   Ragan, Michael, FNP  pantoprazole  (PROTONIX ) 40 MG tablet Take 1 tablet (40 mg total) by mouth 2 (two) times daily. 01/12/24   Kennedy-Smith, Colleen M, NP  pregabalin  (LYRICA ) 200 MG capsule TAKE 1 CAPSULE BY MOUTH TWICE DAILY 11/09/22   Colette Torrence GRADE, MD  sertraline  (ZOLOFT ) 100 MG tablet Take 100 mg by mouth daily. 10/27/23   [provider]  solifenacin (VESICARE) 10 MG tablet Take 10 mg by mouth daily.    [provider]  sucralfate  (CARAFATE ) 1 g tablet TAKE 1 TABLET(1 GRAM) BY MOUTH FOUR TIMES DAILY AT BEDTIME WITH MEALS 05/09/23   Rucker, Torrence GRADE, MD  topiramate  (TOPAMAX ) 100 MG tablet Take by mouth 2 (two) times daily. 08/17/21   [provider]  traZODone  (DESYREL ) 100 MG tablet TAKE 1 TABLET(100 MG) BY MOUTH AT BEDTIME AS NEEDED FOR SLEEP 02/14/23   Colette Torrence GRADE, MD    Allergies: Tramadol, Nsaids, Sulfamethoxazole-trimethoprim, and Ibuprofen    Review of Systems  Cardiovascular:  Positive for chest pain.    Updated Vital Signs BP 117/72 (BP Location: Left Arm)   Pulse 93   Temp 98.3 F (36.8 C) (Oral)   Resp 20   Ht 5' 9 (1.753 m)   Wt 120.2 kg   SpO2 98%   BMI 39.13 kg/m   Physical Exam Vitals and nursing note reviewed.  Constitutional:      General: She is not in acute distress.    Appearance: Normal appearance. She is not ill-appearing or diaphoretic.  HENT:     Head:  Normocephalic and atraumatic.  Eyes:     General: No scleral icterus.       Right eye: No discharge.        Left eye: No discharge.     Extraocular Movements: Extraocular movements intact.     Conjunctiva/sclera: Conjunctivae normal.  Cardiovascular:     Rate and Rhythm: Normal rate and regular rhythm.     Pulses: Normal pulses.     Heart sounds: Normal heart sounds. No murmur heard.    No friction rub. No gallop.  Pulmonary:     Effort: Pulmonary effort is normal. No respiratory distress.     Breath sounds: No stridor. No wheezing, rhonchi or rales.  Chest:     Chest wall: No tenderness.  Abdominal:     General:  Abdomen is flat. There is no distension.     Palpations: Abdomen is soft.     Tenderness: There is abdominal tenderness (Mild epigastric abdominal tenderness). There is no right CVA tenderness, left CVA tenderness, guarding or rebound.  Musculoskeletal:        General: No swelling, deformity or signs of injury.     Cervical back: Normal range of motion. No rigidity.     Right lower leg: No edema.     Left lower leg: No edema.  Skin:    General: Skin is warm and dry.     Findings: No bruising, erythema or lesion.  Neurological:     General: No focal deficit present.     Mental Status: She is alert and oriented to person, place, and time. Mental status is at baseline.     Sensory: No sensory deficit.     Motor: No weakness.  Psychiatric:        Mood and Affect: Mood normal.     (all labs ordered are listed, but only abnormal results are displayed) Labs Reviewed  BASIC METABOLIC PANEL WITH GFR - Abnormal; Notable for the following components:      Result Value   Sodium 133 (*)    CO2 20 (*)    Glucose, Bld 261 (*)    BUN 23 (*)    Creatinine, Ser 1.16 (*)    GFR, Estimated 55 (*)    All other components within normal limits  CBC - Abnormal; Notable for the following components:   RBC 5.47 (*)    All other components within normal limits  TROPONIN T, HIGH  SENSITIVITY  TROPONIN T, HIGH SENSITIVITY    EKG: EKG Interpretation Date/Time:  Thursday February 16 2024 12:58:04 EST Ventricular Rate:  92 PR Interval:  158 QRS Duration:  80 QT Interval:  366 QTC Calculation: 452 R Axis:   56  Text Interpretation: Normal sinus rhythm Anterior infarct , age undetermined Abnormal ECG When compared with ECG of 26-Feb-2023 16:38, PREVIOUS ECG IS PRESENT Confirmed by Yolande Charleston [id number]) on 02/16/2024 3:04:43 PM  Radiology: [facility] Chest 2 View Result Date: 02/16/2024 EXAM: 2 VIEW(S) XRAY OF THE CHEST 02/16/2024 01:48:00 PM COMPARISON: 02/26/2023 CLINICAL HISTORY: CP FINDINGS: LUNGS AND PLEURA: Low lung volume. No focal pulmonary opacity. No pleural effusion. No pneumothorax. HEART AND MEDIASTINUM: No acute abnormality of the cardiac and mediastinal silhouettes. BONES AND SOFT TISSUES: No acute osseous abnormality. IMPRESSION: 1. Low lung volumes. 2. No acute findings. Electronically signed by: Waddell Calk MD 02/16/2024 02:14 PM EST RP Workstation: [id number]   Procedures   Medications Ordered in the ED  morphine  (PF) 4 MG/ML injection 4 mg (has no administration in time range)  lactated ringers  bolus 500 mL (has no administration in time range)    Clinical Course as of 02/16/24 1523  Thu Feb 16, 2024  1451 Unstable angina, pending cardiac recs [SE]  1516 Spoke with cardiology [CB]    Clinical Course User Index [CB] Beola Terrall RAMAN, PA-C [SE] Guillermina Hamilton, MD    Medical Decision Making Amount and/or Complexity of Data Reviewed Labs: ordered. Radiology: ordered.   This patient is a 53 year old Female who presents to the ED for concern of acute onset chest pain starting last night progressively worsening today, noted to be persistent even at rest, but worse on exertion.  Noted to recent underwent cardiac stenting on 12/1, having 5 stents placed.  Has been taking Plavix  with no missed doses.  Reported having taken nitro  x 2 with mild relief  of symptoms but chest pain still present at this time.  On physical exam, patient is in no acute distress, afebrile, alert and orient x 4, speaking in full sentences, nontachypneic, nontachycardic.  LCTAB, RRR, no murmur.  No leg edema.  Mild epigastric tenderness to palpation.  With exam otherwise unremarkable.  Patient notably tearful and in pain, patient provided morphine .  Lab work does not show any acute abnormalities at this time, with negative troponin.  However with persistent chest pain, suspecting likely unstable angina and will consult cardiology.  Spoke with cardiology and spoke to cards master who said that they would come and evaluate the patient.  Differential diagnoses prior to evaluation: The emergent differential diagnosis includes, but is not limited to, ACS, AAS, Pulmonary Embolism, Tension Pneumothorax, Esophageal Rupture, Cardiac Tamponade, Pericarditis, Myocarditis, Pneumothorax, Pneumonia, Aortic Stenosis, Musculoskeletal Chest Wall Pain, Anxiety / Panic Attack. This is not an exhaustive differential.   Past Medical History / Co-morbidities / Social History: CAD, HTN, chronic shoulder pain, GERD, coronary stenting, migraine, type 1 diabetes, CKD stage III,  Additional history: Chart reviewed. Pertinent results include:   Last admitted on 01/26/2024 for NSTEMI Underwent coronary stenting on 12/1 by Dr. Rosiland.  Lab Tests/Imaging studies: I personally interpreted labs/imaging and the pertinent results include:   CBC notes an elevated RBC of 5.47 but otherwise unremarkable BMP shows mild hyponatremia, elevated creatinine and mildly decreased GFR Troponin undetectable Chest x-ray shows low lung volumes but no acute findings I agree with the radiologist interpretation.  Cardiac monitoring: EKG obtained and interpreted by myself and attending physician which shows: NSR with anterior infarct of undetermined age  EKG Interpretation Date/Time:  Thursday February 16 2024  12:58:04 EST Ventricular Rate:  92 PR Interval:  158 QRS Duration:  80 QT Interval:  366 QTC Calculation: 452 R Axis:   56  Text Interpretation: Normal sinus rhythm Anterior infarct , age undetermined Abnormal ECG When compared with ECG of 26-Feb-2023 16:38, PREVIOUS ECG IS PRESENT Confirmed by Yolande Charleston [phone]) on 02/16/2024 3:04:43 PM          Medications: I ordered medication including LR, morphine .  I have reviewed the patients home medicines and have made adjustments as needed.  Critical Interventions: None  Social Determinants of Health: None  Disposition: 3:23 PM Care of Quetzali Nesser transferred to Dr. Glendia Ancona at the end of my shift as the patient will require reassessment once labs/imaging have resulted. Patient presentation, ED course, and plan of care discussed with review of all pertinent labs and imaging. Please see his/her note for further details regarding further ED course and disposition. Plan at time of handoff is await cardiology evaluation, anticipate likely admission for observation until chest pain-free. This may be altered or completely changed at the discretion of the oncoming team pending results of further workup.    Final diagnoses:  Unstable angina [facility])    ED Discharge Orders     None          Beola Terrall RAMAN, [place] 02/16/24 1523

## 2024-02-16 NOTE — H&P (Signed)
 History and Physical    Jean Calderon FMW:[id number] DOB: Jul 26, 1970 DOA: 02/16/2024  PCP: Lyle Wells Louder, FNP  Patient coming from: home  I have personally briefly reviewed patient's old medical records in [facility]  Chief Complaint: chest pain   HPI: Jean Calderon is a 53 y.o. female with medical history significant of  hx of CAD s/p remote PCI 2010,with recent NSTEMI s/p PCI to LAD 01/30/24 at [facility], PVD, CKD IIIa, uncontrolled IDDM, HTN, HLD, elevated LFTs, radiculopathy, neuropathy, arthritis, anxiety, depression, snoring, former tobacco use who presents to ed with complaint of chest pain. Patient BIB EMS from urgent card due to chest pain with radiation to her left arm and back x 1 day. Patient notes associated sob as well as nausea.  En route patient was given 325 asa 1 SL nitroglycerine with some relief.  Patient currently states    ED Course:  Vitals afeb,  BP 117/72,  hr 93, rr 20 , sat 98%  ED EKG: NSR , anterior infarct unchanged from prior EKG Wbc 8.4, hgb 14.3plt 273 Na 133, K 4.2, Cl 99, bicarb 20, Glu 261 cr 1.16 at baseline  CE<15,15 Cxr  IMPRESSION: 1. Low lung volumes. 2. No acute findings.  Tx morphine , LR500,fentanyl ,plavix  , imdur  60 mg Tx atorvastatin  , asa 81mg  , carafate    Review of Systems: As per HPI otherwise 10 point review of systems negative.   Past Medical History:  Diagnosis Date   CAD (coronary artery disease)    Cervical radiculopathy 08/15/2017   CKD (chronic kidney disease), stage II    COVID-19 01/2022   DM (diabetes mellitus) (HCC)    HTN (hypertension)    Hyperlipidemia    Lumbar radiculopathy 06/21/2019   Myocardial infarction [facility])    Neuropathy    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   1. Severe tendinosis of the supraspinatus tendon.  2. Mild tendinosis of the infraspinatus tendon.  3. Thickening of the inferior joint capsule and intermediate signal  material effacing the normal subcoracoid fat as can be  seen with  adhesive capsulitis.    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    Past Surgical History:  Procedure Laterality Date   APPENDECTOMY     BACK SURGERY     CESAREAN SECTION     COLONOSCOPY N/A 12/28/2023   Procedure: COLONOSCOPY;  Surgeon: Wilhelmenia Aloha Raddle., MD;  Location: [facility];  Service: Gastroenterology;  Laterality: N/A;   CORONARY ANGIOPLASTY WITH STENT PLACEMENT     CORONARY STENT PLACEMENT     ESOPHAGOGASTRODUODENOSCOPY N/A 10/10/2023   Procedure: EGD (ESOPHAGOGASTRODUODENOSCOPY);  Surgeon: Wilhelmenia Aloha Raddle., MD;  Location: [facility];  Service: Gastroenterology;  Laterality: N/A;   EUS N/A 12/28/2023   Procedure: ULTRASOUND, UPPER GI TRACT, ENDOSCOPIC;  Surgeon: Wilhelmenia Aloha Raddle., MD;  Location: [facility];  Service: Gastroenterology;  Laterality: N/A;   insulin  pump     MULTIPLE TOOTH EXTRACTIONS     ROTATOR CUFF REPAIR       reports that she has quit smoking. Her smoking use included cigarettes. She has never used smokeless tobacco. She reports that she does not drink alcohol and does not use drugs.  Allergies[1]  Family History  Problem Relation Age of Onset   Heart disease Mother    Heart disease Sister     Prior to Admission medications  Medication Sig Start Date End Date Taking? Authorizing Provider  acetaminophen  (TYLENOL ) 500 MG tablet Take  1 tablet (500 mg total) by mouth every 6 (six) hours as needed. 10/20/23   Dreama, [place]  N, FNP  amitriptyline  (ELAVIL ) 25 MG tablet Take 1 tablet (25 mg total) by mouth at bedtime. Take an hour before bed for nerve pain 11/30/23   Maranda Jamee Jacob, MD  aspirin  EC 81 MG tablet Take 81 mg by mouth daily.    [provider]  atorvastatin  (LIPITOR) 40 MG tablet TAKE 1 TABLET(40 MG) BY MOUTH DAILY 04/01/23   Rucker, Alethea Y, MD  azelastine (OPTIVAR) 0.05 % ophthalmic solution Apply 1 drop to  eye daily. 06/26/23   [provider]  clopidogrel  (PLAVIX ) 75 MG tablet Take 1 tablet (75 mg total) by mouth daily. 09/19/23   Pietro Redell RAMAN, MD  Continuous Blood Gluc Receiver (DEXCOM G7 RECEIVER) DEVI Check sugars daily Dx E11.9 04/05/22   Colette Torrence GRADE, MD  Continuous Blood Gluc Sensor (DEXCOM G7 SENSOR) MISC Check sugars daily Dx E11.9 04/05/22   Colette Torrence GRADE, MD  Continuous Glucose Transmitter (DEXCOM G6 TRANSMITTER) MISC  07/01/22   [provider]  Crisaborole (EUCRISA) 2 % OINT Apply 1 Application topically 2 (two) times daily. 06/11/20   [provider]  Estradiol -Norethindrone  Acet 0.5-0.1 MG tablet Take 1 tablet by mouth daily. 12/06/22   Colette Torrence GRADE, MD  fluticasone  (FLONASE ) 50 MCG/ACT nasal spray Place 1 spray into both nostrils daily. 06/26/23   [provider]  glipiZIDE (GLUCOTROL XL) 5 MG 24 hr tablet Take 5 mg by mouth daily with breakfast.    [provider]  HUMALOG  100 UNIT/ML injection INJECT 25 UNITS INTO THE SKIN 3 TIMES DAILY BEFORE MEALS. USE INSULIN  PUMP 05/12/22   Rucker, Torrence GRADE, MD  Insulin  Aspart, w/Niacinamide, (FIASP ) 100 UNIT/ML SOLN To be used with insulin  pump. 07/08/22   [provider]  Insulin  Disposable Pump (OMNIPOD 5 G6 PODS, GEN 5,) MISC CHANGE POD EVERY OTHER DAY. 06/07/22   Colette Torrence GRADE, MD  isosorbide  mononitrate (IMDUR ) 30 MG 24 hr tablet TAKE 1 TABLET(30 MG) BY MOUTH DAILY 02/21/23   Colette Torrence GRADE, MD  JARDIANCE  25 MG TABS tablet Take 1 tablet (25 mg total) by mouth daily. 06/15/22   Colette Torrence GRADE, MD  ketoconazole  (NIZORAL ) 2 % shampoo Apply to affected areas 3 x a week for 8 weeks 11/18/22   Colette Torrence GRADE, MD  lisinopril -hydrochlorothiazide  (ZESTORETIC ) 20-12.5 MG tablet Take 1 tablet by mouth daily. 10/27/23   [provider]  metoprolol  succinate (TOPROL -XL) 100 MG 24 hr tablet Take 1 tablet (100 mg total) by mouth daily. 11/12/22   Pietro Redell RAMAN, MD  mometasone   (NASONEX ) 50 MCG/ACT nasal spray One spray in each nostril twice a day, use left hand for right nostril, and right hand for left nostril.  Please dispense one bottle. 05/24/22   Rucker, Torrence GRADE, MD  montelukast  (SINGULAIR ) 10 MG tablet TAKE 1 TABLET(10 MG) BY MOUTH AT BEDTIME 04/25/23   Rucker, Torrence GRADE, MD  nitroGLYCERIN  (NITROSTAT ) 0.4 MG SL tablet Place 1 tablet (0.4 mg total) under the tongue every 5 (five) minutes as needed for chest pain. 11/12/22   Pietro Redell RAMAN, MD  NOVOLOG  100 UNIT/ML injection Inject 100 Units into the skin once. 02/20/23   [provider]  nystatin  ointment (MYCOSTATIN ) Apply 1 Application topically 2 (two) times daily. 07/22/22   Colette Torrence GRADE, MD  olopatadine  (PATADAY ) 0.1 % ophthalmic solution Place 1 drop into both eyes 2 (two) times daily. 05/24/22  Colette Torrence GRADE, MD  oxyCODONE -acetaminophen  (PERCOCET/ROXICET) 5-325 MG tablet Take 1 tablet by mouth every 8 (eight) hours as needed for severe pain (pain score 7-10). 02/07/24   Ragan, Michael, FNP  pantoprazole  (PROTONIX ) 40 MG tablet Take 1 tablet (40 mg total) by mouth 2 (two) times daily. 01/12/24   Kennedy-Smith, Colleen M, NP  pregabalin  (LYRICA ) 200 MG capsule TAKE 1 CAPSULE BY MOUTH TWICE DAILY 11/09/22   Colette Torrence GRADE, MD  sertraline  (ZOLOFT ) 100 MG tablet Take 100 mg by mouth daily. 10/27/23   [provider]  solifenacin (VESICARE) 10 MG tablet Take 10 mg by mouth daily.    [provider]  sucralfate  (CARAFATE ) 1 g tablet TAKE 1 TABLET(1 GRAM) BY MOUTH FOUR TIMES DAILY AT BEDTIME WITH MEALS 05/09/23   Rucker, Torrence GRADE, MD  topiramate  (TOPAMAX ) 100 MG tablet Take by mouth 2 (two) times daily. 08/17/21   [provider]  traZODone  (DESYREL ) 100 MG tablet TAKE 1 TABLET(100 MG) BY MOUTH AT BEDTIME AS NEEDED FOR SLEEP 02/14/23   Colette Torrence GRADE, MD    Physical Exam: Vitals:   02/16/24 1615 02/16/24 1800 02/16/24 1915 02/16/24 1930  BP: (!) 111/55 111/62 136/68  120/73  Pulse: 85 83 86 60  Resp: 13 (!) 21 10 13   Temp:      TempSrc:      SpO2: 100% 95% 96% 97%  Weight:      Height:        Constitutional: NAD, calm, comfortable Vitals:   02/16/24 1615 02/16/24 1800 02/16/24 1915 02/16/24 1930  BP: (!) 111/55 111/62 136/68 120/73  Pulse: 85 83 86 60  Resp: 13 (!) 21 10 13   Temp:      TempSrc:      SpO2: 100% 95% 96% 97%  Weight:      Height:       Eyes: PERRL, lids and conjunctivae normal ENMT: Mucous membranes are moist. Posterior pharynx clear of any exudate or lesions.Normal dentition.  Neck: normal, supple, no masses, no thyromegaly Respiratory: clear to auscultation bilaterally, no wheezing, no crackles. Normal respiratory effort. No accessory muscle use.  Cardiovascular: Regular rate and rhythm, no murmurs / rubs / gallops. No extremity edema. 2+ pedal pulses. No carotid bruits.  Abdomen: no tenderness, no masses palpated. No hepatosplenomegaly. Bowel sounds positive.  Musculoskeletal: no clubbing / cyanosis. No joint deformity upper and lower extremities. Good ROM, no contractures. Normal muscle tone.  Skin: no rashes, lesions, ulcers. No induration Neurologic: CN 2-12 grossly intact. Sensation intact, DTR normal. Strength 5/5 in all 4.  Psychiatric: Normal judgment and insight. Alert and oriented x 3. Normal mood.    Labs on Admission: I have personally reviewed following labs and imaging studies  CBC: Recent Labs  Lab 02/16/24 1315  WBC 8.4  HGB 14.3  HCT 44.3  MCV 81.0  PLT 273   Basic Metabolic Panel: Recent Labs  Lab 02/16/24 1315  NA 133*  K 4.2  CL 99  CO2 20*  GLUCOSE 261*  BUN 23*  CREATININE 1.16*  CALCIUM  10.2   GFR: Estimated Creatinine Clearance: 76 mL/min (A) (by C-G formula based on SCr of 1.16 mg/dL (H)). Liver Function Tests: No results for input(s): AST, ALT, ALKPHOS, BILITOT, PROT, ALBUMIN in the last 168 hours. No results for input(s): LIPASE, AMYLASE in the last 168  hours. No results for input(s): AMMONIA in the last 168 hours. Coagulation Profile: No results for input(s): INR, PROTIME in the last 168 hours. Cardiac Enzymes:  No results for input(s): CKTOTAL, CKMB, CKMBINDEX, TROPONINI in the last 168 hours. BNP (last 3 results) No results for input(s): PROBNP in the last 8760 hours. HbA1C: No results for input(s): HGBA1C in the last 72 hours. CBG: No results for input(s): GLUCAP in the last 168 hours. Lipid Profile: No results for input(s): CHOL, HDL, LDLCALC, TRIG, CHOLHDL, LDLDIRECT in the last 72 hours. Thyroid Function Tests: No results for input(s): TSH, T4TOTAL, FREET4, T3FREE, THYROIDAB in the last 72 hours. Anemia Panel: No results for input(s): VITAMINB12, FOLATE, FERRITIN, TIBC, IRON, RETICCTPCT in the last 72 hours. Urine analysis:    Component Value Date/Time   COLORURINE YELLOW 01/14/2021 1158   APPEARANCEUR CLEAR 01/14/2021 1158   LABSPEC 1.010 01/14/2021 1158   PHURINE 5.5 01/14/2021 1158   GLUCOSEU >=500 (A) 01/14/2021 1158   HGBUR NEGATIVE 01/14/2021 1158   BILIRUBINUR negative 02/07/2024 0927   KETONESUR negative 02/07/2024 0927   KETONESUR NEGATIVE 01/14/2021 1158   PROTEINUR negative 02/07/2024 0927   PROTEINUR NEGATIVE 01/14/2021 1158   UROBILINOGEN 0.2 02/07/2024 0927   NITRITE Negative 02/07/2024 0927   NITRITE NEGATIVE 01/14/2021 1158   LEUKOCYTESUR Negative 02/07/2024 0927   LEUKOCYTESUR NEGATIVE 01/14/2021 1158    Radiological Exams on Admission: DG Chest 2 View Result Date: 02/16/2024 EXAM: 2 VIEW(S) XRAY OF THE CHEST 02/16/2024 01:48:00 PM COMPARISON: 02/26/2023 CLINICAL HISTORY: CP FINDINGS: LUNGS AND PLEURA: Low lung volume. No focal pulmonary opacity. No pleural effusion. No pneumothorax. HEART AND MEDIASTINUM: No acute abnormality of the cardiac and mediastinal silhouettes. BONES AND SOFT TISSUES: No acute osseous abnormality. IMPRESSION: 1. Low lung  volumes. 2. No acute findings. Electronically signed by: Waddell Calk MD 02/16/2024 02:14 PM EST RP Workstation: [id number]    EKG: Independently reviewed.   Assessment/Plan  Chest pain with mixed features r/o Unstable Angina  CAD s/p NSTEMI s/p PCI/DES -patient with recent Nstemi with stenting 01/30/24  - current evaluation noted negative CE x 2 , and unchanged EKG  - patient seen by cardiology who recommends increasing imdur  to 60mg  daily  -will continue with metoprolol , plavix  /asa/ statin    Uncontrolled DMII - iss/fs  -resume novolog  with meals -resume jardiance   -last A1c   CKDIIIa -at baseline   Depression /Anxiety  -resume home medications elavil  -continue topamax   HLD Continue with statin  HTN -stable , continue on zestroretic , metoprolol    GERD -ppi    DVT prophylaxis:  heparin  Code Status: full/ as discussed per patient wishes in event of cardiac arrest  Family Communication: none at bedside Disposition Plan: patient  expected to be admitted greater than 2 midnights  Consults called: Cardiology  Admission status: progessive   Camila DELENA Ned MD [facility]   If 7PM-7AM, please contact night-coverage [email or web address] Password [email or web address]  02/16/2024, 7:55 PM        [1]  Allergies Allergen Reactions   Tramadol     Other reaction(s): Mental Status Changes (intolerance) Caused Hallucinations   Nsaids Other (See Comments)    Cannot take because she's on Plavix .   Other reaction(s): Bleeding (intolerance) Cannot take because she's on Plavix .    Sulfamethoxazole-Trimethoprim Nausea And Vomiting    History of Colitis   Ibuprofen     Not an allergy. Pt stated she can't take it because of current use of ASA and Plavix 

## 2024-02-16 NOTE — ED Triage Notes (Signed)
 Pt arrives via [facility] from [facility] for L sided CP radiating to her L arm and to her back that started last night. Pt states it suddenly worsened at 0800 and woke her from sleep. Pt also c/o SOB and nausea. Given 325 asa and 1 SL nitroglycerin  without relief. Pt with 3 MI's in the past.

## 2024-02-16 NOTE — Consult Note (Addendum)
 Cardiology Consultation   Patient ID: Jean Calderon MRN: [id number]; DOB: 12-Aug-1970  Admit date: 02/16/2024 Date of Consult: 02/16/2024  PCP:  Lyle Wells Louder, FNP   [facility] Providers Cardiologist:  Redell Shallow, MD        Patient Profile: Jean Calderon is a 53 y.o. female with a hx of CAD s/p remote PCI 2010 (in [place] , details unclear) with recent NSTEMI s/p PCI to LAD 01/30/24 at [facility], PVD (mild atherosclerosis on LE duplex 03/2023), CKD 3a, uncontrolled IDDM, HTN, HLD, elevated LFTs, radiculopathy, neuropathy, arthritis, anxiety, depression, snoring, former tobacco use who is being seen 02/16/2024 for the evaluation of chest pain at the request of Dr. Yolande.  History of Present Illness: [name] sees Dr. Shallow with above history with remote PCI 2010, details unclear. Cath 2022 showed nonobstructive disease. However, she was recently admitted to [facility] 11/27-12/2/25 with diagnosis of NSTEMI with hsTroponins 17-19. CTA neg for PE. Underwent cath 01/30/24 with 70% mid-distal Cx, 60% OM1, 50% ramus, 15% prox LAD, 85% mLAD, 15% dLAD2, 50% prox RCA, 35% dRCA, 50% dLAD. She underwent angioplasty of in-stent restenosis within the mid LAD, drug-eluting balloon angioplasty of the mid LAD in-stent restenosis extending into the mid LAD, and stenting (DES) of the mid LAD just above the third previously deployed stent due to plaque disruption during angioplasty. Echo 01/28/24 showed EF 60-65%, otherwise no significant abnormalities. Discharge med rec indicates addition of ASA (to pre-existing Plavix ) and carvedilol, increase in atorvastatin , cessation of clonidine , estradiol -norethindrone , glipizide, metoprolol , and lisinopril -hydrochlorothiazide . Unclear why latter were discontinued; note mentions lisinopril /hydrochlorothiazide  held while NPO. She had uncontrolled DM during stay with A1C 14.2 and pseudohyponatremia as well as abnormal LFTs similar to prior.  Last night  she developed generalized chest pressure radiating to her left arm and epigastric region. She had a hard time resting last night as she felt generally uncomfortable. She was finally able to drift off but then awoke around 8am with sensation of racing heart with HR 100-108 associated with SOB and nausea. She tried to get ready for the day, shower, etc but symptoms persisted. She saw primary care and also reported episodic SOB and nausea. EMS was called. She was given 325mg  ASA. Also received 2 SL NTG with mild improvement. In the ED she was treated with IV fluids, 4mg  morphine  and 50mcg of fentanyl  due to ongoing chest pain with some improvement, still has some vague awareness of the chest pressure. She states she has a lot going on, somewhat tearful, with regard to her diabetes. Labs show neg troponin x2, normal H/H, Cr 1.16 c/w prior, glucose 261 with sodium 133. CXR with low lung volumes otherwise nonacute. EKG shows NSR 92bpm, old anterior infarct, lower voltage, nonspecific TWI III, without significant change from prior. She has not had any period since last night where she was completely free of chest discomfort.  Past Medical History:  Diagnosis Date   CAD (coronary artery disease)    Cervical radiculopathy 08/15/2017   CKD (chronic kidney disease), stage II    COVID-19 01/2022   DM (diabetes mellitus) (HCC)    HTN (hypertension)    Hyperlipidemia    Lumbar radiculopathy 06/21/2019   Myocardial infarction [facility])    Neuropathy    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   1. Severe tendinosis of the supraspinatus tendon.  2. Mild tendinosis of the infraspinatus tendon.  3. Thickening of the inferior joint capsule and intermediate signal  material effacing the normal subcoracoid fat as can be seen  with  adhesive capsulitis.    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    Past Surgical History:   Procedure Laterality Date   APPENDECTOMY     BACK SURGERY     CESAREAN SECTION     COLONOSCOPY N/A 12/28/2023   Procedure: COLONOSCOPY;  Surgeon: Wilhelmenia Aloha Raddle., MD;  Location: [facility];  Service: Gastroenterology;  Laterality: N/A;   CORONARY ANGIOPLASTY WITH STENT PLACEMENT     CORONARY STENT PLACEMENT     ESOPHAGOGASTRODUODENOSCOPY N/A 10/10/2023   Procedure: EGD (ESOPHAGOGASTRODUODENOSCOPY);  Surgeon: Wilhelmenia Aloha Raddle., MD;  Location: [facility];  Service: Gastroenterology;  Laterality: N/A;   EUS N/A 12/28/2023   Procedure: ULTRASOUND, UPPER GI TRACT, ENDOSCOPIC;  Surgeon: Wilhelmenia Aloha Raddle., MD;  Location: [facility];  Service: Gastroenterology;  Laterality: N/A;   insulin  pump     MULTIPLE TOOTH EXTRACTIONS     ROTATOR CUFF REPAIR       Home Medications:  Prior to Admission medications  Medication Sig Start Date End Date Taking? Authorizing Provider  acetaminophen  (TYLENOL ) 500 MG tablet Take 1 tablet (500 mg total) by mouth every 6 (six) hours as needed. 10/20/23   Dreama, [place]  N, FNP  amitriptyline  (ELAVIL ) 25 MG tablet Take 1 tablet (25 mg total) by mouth at bedtime. Take an hour before bed for nerve pain 11/30/23   Maranda Jamee Jacob, MD  aspirin  EC 81 MG tablet Take 81 mg by mouth daily.    [provider]  atorvastatin  (LIPITOR) 40 MG tablet TAKE 1 TABLET(40 MG) BY MOUTH DAILY 04/01/23   Rucker, Alethea Y, MD  azelastine (OPTIVAR) 0.05 % ophthalmic solution Apply 1 drop to eye daily. 06/26/23   [provider]  clopidogrel  (PLAVIX ) 75 MG tablet Take 1 tablet (75 mg total) by mouth daily. 09/19/23   Pietro Redell RAMAN, MD  Continuous Blood Gluc Receiver (DEXCOM G7 RECEIVER) DEVI Check sugars daily Dx E11.9 04/05/22   Colette Torrence GRADE, MD  Continuous Blood Gluc Sensor (DEXCOM G7 SENSOR) MISC Check sugars daily Dx E11.9 04/05/22   Colette Torrence GRADE, MD  Continuous Glucose Transmitter (DEXCOM G6 TRANSMITTER) MISC  07/01/22   [provider]  Crisaborole (EUCRISA) 2 % OINT Apply 1 Application topically 2 (two) times daily. 06/11/20   [provider]  Estradiol -Norethindrone  Acet 0.5-0.1 MG tablet Take 1 tablet by mouth daily. 12/06/22   Colette Torrence GRADE, MD  fluticasone  (FLONASE ) 50 MCG/ACT nasal spray Place 1 spray into both nostrils daily. 06/26/23   [provider]  glipiZIDE (GLUCOTROL XL) 5 MG 24 hr tablet Take 5 mg by mouth daily with breakfast.    [provider]  HUMALOG  100 UNIT/ML injection INJECT 25 UNITS INTO THE SKIN 3 TIMES DAILY BEFORE MEALS. USE INSULIN  PUMP 05/12/22   Colette Torrence GRADE, MD  Insulin  Aspart, w/Niacinamide, (FIASP ) 100 UNIT/ML SOLN To be used with insulin  pump. 07/08/22   [provider]  Insulin  Disposable Pump (OMNIPOD 5 G6 PODS, GEN 5,) MISC CHANGE POD EVERY OTHER DAY. 06/07/22   Colette Torrence GRADE, MD  isosorbide  mononitrate (IMDUR ) 30 MG 24 hr tablet TAKE 1 TABLET(30 MG) BY MOUTH DAILY 02/21/23   Colette Torrence GRADE, MD  JARDIANCE  25 MG TABS tablet Take 1 tablet (25 mg total) by mouth daily. 06/15/22   Colette Torrence GRADE, MD  ketoconazole  (NIZORAL ) 2 % shampoo Apply to affected areas 3 x a week for 8 weeks 11/18/22  Colette Torrence GRADE, MD  lisinopril -hydrochlorothiazide  (ZESTORETIC ) 20-12.5 MG tablet Take 1 tablet by mouth daily. 10/27/23   [provider]  metoprolol  succinate (TOPROL -XL) 100 MG 24 hr tablet Take 1 tablet (100 mg total) by mouth daily. 11/12/22   Pietro Redell RAMAN, MD  mometasone  (NASONEX ) 50 MCG/ACT nasal spray One spray in each nostril twice a day, use left hand for right nostril, and right hand for left nostril.  Please dispense one bottle. 05/24/22   Colette Torrence GRADE, MD  montelukast  (SINGULAIR ) 10 MG tablet TAKE 1 TABLET(10 MG) BY MOUTH AT BEDTIME 04/25/23   Rucker, Torrence GRADE, MD  nitroGLYCERIN  (NITROSTAT ) 0.4 MG SL tablet Place 1 tablet (0.4 mg total) under the tongue every 5 (five) minutes as needed for chest pain. 11/12/22   Pietro Redell RAMAN, MD  NOVOLOG  100 UNIT/ML injection Inject 100 Units into the skin once. 02/20/23   [provider]  nystatin  ointment (MYCOSTATIN ) Apply 1 Application topically 2 (two) times daily. 07/22/22   Colette Torrence GRADE, MD  olopatadine  (PATADAY ) 0.1 % ophthalmic solution Place 1 drop into both eyes 2 (two) times daily. 05/24/22   Colette Torrence GRADE, MD  oxyCODONE -acetaminophen  (PERCOCET/ROXICET) 5-325 MG tablet Take 1 tablet by mouth every 8 (eight) hours as needed for severe pain (pain score 7-10). 02/07/24   Ragan, Michael, FNP  pantoprazole  (PROTONIX ) 40 MG tablet Take 1 tablet (40 mg total) by mouth 2 (two) times daily. 01/12/24   Kennedy-Smith, Colleen M, NP  pregabalin  (LYRICA ) 200 MG capsule TAKE 1 CAPSULE BY MOUTH TWICE DAILY 11/09/22   Colette Torrence GRADE, MD  sertraline  (ZOLOFT ) 100 MG tablet Take 100 mg by mouth daily. 10/27/23   [provider]  solifenacin (VESICARE) 10 MG tablet Take 10 mg by mouth daily.    [provider]  sucralfate  (CARAFATE ) 1 g tablet TAKE 1 TABLET(1 GRAM) BY MOUTH FOUR TIMES DAILY AT BEDTIME WITH MEALS 05/09/23   Rucker, Torrence GRADE, MD  topiramate  (TOPAMAX ) 100 MG tablet Take by mouth 2 (two) times daily. 08/17/21   [provider]  traZODone  (DESYREL ) 100 MG tablet TAKE 1 TABLET(100 MG) BY MOUTH AT BEDTIME AS NEEDED FOR SLEEP 02/14/23   Colette Torrence GRADE, MD    Scheduled Meds:  Continuous Infusions:  PRN Meds:   Allergies:   Allergies[1]  Social History:   Social History   Socioeconomic History   Marital status: Married    Spouse name: Not on file   Number of children: 4   Years of education: Not on file   Highest education level: Not on file  Occupational History   Not on file  Tobacco Use   Smoking status: Former    Types: Cigarettes   Smokeless tobacco: Never  Vaping Use   Vaping status: Never Used  Substance and Sexual Activity   Alcohol use: Never   Drug use: Never   Sexual activity: Not on file  Other  Topics Concern   Not on file  Social History Narrative   Not on file   Social Drivers of Health   Tobacco Use: Medium Risk (02/16/2024)   Received from [facility]   Patient History    Smoking Tobacco Use: Former    Smokeless Tobacco Use: Never    Passive Exposure: Past  Physicist, Medical Strain: Low Risk (01/30/2024)   Received from [facility]   Overall Financial Resource Strain (CARDIA)    How hard is it for you to pay for the very basics like food,  housing, medical care, and heating?: Not hard at all  Food Insecurity: Patient Declined (01/30/2024)   Received from [facility]   Epic    Within the past 12 months, you worried that your food would run out before you got the money to buy more.: Patient declined    Within the past 12 months, the food you bought just didn't last and you didn't have money to get more.: Patient declined  Transportation Needs: Patient Declined (01/30/2024)   Received from [facility]    In the past 12 months, has lack of transportation kept you from medical appointments or from getting medications?: Patient declined    In the past 12 months, has lack of transportation kept you from meetings, work, or from getting things needed for daily living?: Patient declined  Physical Activity: Inactive (01/30/2024)   Received from [facility]   Exercise Vital Sign    On average, how many days per week do you engage in moderate to strenuous exercise (like a brisk walk)?: Patient declined    On average, how many minutes do you engage in exercise at this level?: 0 min  Stress: Patient Declined (01/30/2024)   Received from [facility] of Occupational Health - Occupational Stress Questionnaire    Do you feel stress - tense, restless, nervous, or anxious, or unable to sleep at night because your mind is troubled all the time - these days?: Patient declined  Social Connections: Unknown (07/02/2021)   Received from [facility]   Social  Network    Social Network: Not on file  Intimate Partner Violence: Patient Declined (01/30/2024)   Received from [facility]   HITS    Over the last 12 months how often did your partner scream or curse at you?: Patient declined    Over the last 12 months how often did your partner physically hurt you?: Patient declined    Over the last 12 months how often did your partner insult you or talk down to you?: Patient declined    Over the last 12 months how often did your partner threaten you with physical harm?: Patient declined  Depression (PHQ2-9): Low Risk (08/23/2022)   Depression (PHQ2-9)    PHQ-2 Score: 0  Recent Concern: Depression (PHQ2-9) - High Risk (07/19/2022)   Depression (PHQ2-9)    PHQ-2 Score: 17  Alcohol Screen: Not on file  Housing: Unknown (01/30/2024)   Received from [facility]    In the last 12 months, was there a time when you were not able to pay the mortgage or rent on time?: Patient declined    In the past 12 months, how many times have you moved where you were living?: 0    At any time in the past 12 months, were you homeless or living in a shelter (including now)?: No  Utilities: Patient Declined (01/30/2024)   Received from [facility]    In the past 12 months has the electric, gas, oil, or water company threatened to shut off services in your home?: Patient declined  Health Literacy: Not on file    Family History:   Family History  Problem Relation Age of Onset   Heart disease Mother    Heart disease Sister      ROS:  Please see the history of present illness.  All other ROS reviewed and negative.     Physical Exam/Data: Vitals:   02/16/24 1406 02/16/24 1603  02/16/24 1615 02/16/24 1800  BP:  (!) 112/57 (!) 111/55 111/62  Pulse:  84 85 83  Resp:  16 13 (!) 21  Temp:  98.1 F (36.7 C)    TempSrc:  Oral    SpO2:  (!) 86% 100% 95%  Weight: 120.2 kg     Height: 5' 9 (1.753 m)      No intake or output data in the 24 hours ending  02/16/24 1820    02/16/2024    2:06 PM 12/28/2023    9:52 AM 12/21/2023    1:51 PM  Last 3 Weights  Weight (lbs) 265 lb 270 lb 273 lb 5.9 oz  Weight (kg) 120.203 kg 122.471 kg 124 kg     Body mass index is 39.13 kg/m.  General: Obese AAF in no acute distress. Head: Normocephalic, atraumatic, sclera non-icteric, no xanthomas, nares are without discharge. Neck: Negative for carotid bruits. JVP not elevated. Lungs: Clear bilaterally to auscultation without wheezes, rales, or rhonchi. Breathing is unlabored. Heart: RRR S1 S2 without murmurs, rubs, or gallops.  Abdomen: Soft, non-tender, non-distended with normoactive bowel sounds. No rebound/guarding. Extremities: No clubbing or cyanosis. No edema. Distal pedal pulses are 2+ and equal bilaterally. Neuro: Alert and oriented X 3. Moves all extremities spontaneously. Psych:  Responds to questions appropriately with a normal affect.   EKG:  The EKG was personally reviewed and demonstrates:  NSR 92bpm, old anterior infarct, lower voltage, nonspecific TWI III, otherwise nonacute Telemetry:  Telemetry was personally reviewed and demonstrates:  NSR  Relevant CV Studies:  [facility] 2d echo 01/28/24  Left Ventricle: EF: 60-65%. Quantitative analysis of left ventricular  Global Longitudinal Strain (GLS) imaging is -16.300%, which is abnormal.    Right Ventricle: Systolic function is normal.    Aortic Valve: The aortic valve is tricuspid. The leaflets are not  thickened and exhibit normal excursion.    Mitral Valve: Mitral valve structure is normal.    Tricuspid Valve: Tricuspid valve structure is normal.    Pericardium: There is no pericardial effusion.   [facility] Cath 01/30/24 Impression Mid Cx to Dist Cx lesion is 70% stenosed.   1st Mrg lesion is 60% stenosed.   Ramus lesion is 50% stenosed.   Prox LAD lesion is 15% stenosed.   Mid LAD lesion is 85% stenosed.   Dist LAD-2 lesion is 15% stenosed.   Prox RCA lesion is 50% stenosed.   Dist  RCA lesion is 35% stenosed.   Dist LAD-1 lesion is 50% stenosed.  POST-PROCEDURE DIAGNOSIS:  Two-vessel obstructive coronary artery disease of the left anterior descending artery distal left circumflex with moderate branch vessel disease of the ramus intermedius, circumflex marginal branch and right coronary artery  FINDINGS: Hemodynamics 1. Aortic Pressure -113/62 mmHg.  Mean 78 mmHg 2. Left Ventricular -118/10 mmHg Additional comments on on hemodynamics: LVEDP 14  Coronary Angiography Anatomically right dominant Diffuse fluoroscopic coronary calcification   Left Main: Angiographically normal.  Trifurcates distally. Left anterior descending artery: Stent noted in the proximal LAD.  Minor in-stent restenosis no more than 15 to 20%.  Mild irregularities in the proximal to mid segment.  Stent noted in the mid LAD.  There was severe in-stent restenosis in the mid to distal stent extending beyond the stent into the mid LAD.  There was moderate irregularities in the mid LAD.   There was a third stent in the mid to distal LAD.  Minor in-stent restenosis no more than 15% irregularities.  Distal LAD was small with diffuse  irregularities. Diagonal branches: First diagonal branch arises off of the proximal to mid LAD and has mild irregularities. Ramus Intermedius: Small vessel.  Diffuse irregularities.  40% mid vessel stenosis. Left Circumflex: Anatomically nondominant.  Mild diffuse luminal regularities.  70% stenosis in the distal segment where it tapers to a small vessel. Obtuse Marginal branches: -Small first marginal branch of the mid left circumflex has 60% stenosis just above the bifurcation and then diffuse moderate irregularities in the small branch vessels. Right Coronary Artery: Anatomically dominant.  Mild diffuse irregularities.  50% mid vessel stenosis.  30 to 40% distal stenosis. Posterior Descending Artery: Small vessel with diffuse irregularities. Posterolateral  Branch: Small vessel with diffuse irregularities. Left Ventriculography -not performed.   PROCEDURES PERFORMED:  PERCUTANEOUS INTERVENTION: Percutaneous coronary intervention of mid LAD - Angioplasty of in-stent restenosis within the mid LAD - Drug-eluting balloon angioplasty of the mid LAD in-stent restenosis extending into the mid LAD. - Stenting of the mid LAD just above the third previously deployed stent due to plaque disruption during angioplasty.  EQUIPMENT: 41F EBU 3.5 guide catheter 0.014 inch x 180  cm Runthrough guidewire 2.5 x 15 mm Emerge balloon 2.5 x 15 mm Wolverine cutting balloon 3.0 x 20 mm [place] Emerge balloon 3.5 x 12 mm [place] Emerge balloon 3.0 x 20 mm agent drug coated balloon. 2.5 x 20 mm Synergy DES   MEDICATIONS: Additional Heparin  7500 units IVP Patient has been on Plavix  and on outpatient basis.  COMPLICATIONS: None  DESCRIPTION OF PROCEDURE: EBU 3.5 guide was placed at the left main coronary artery.  Run-through guidewire was advanced into the LAD and the tip placed in the mid to distal LAD beyond the third stent visualized in the mid to distal segment.  The noncompliant balloon was brought into the LAD to treat the in-stent restenosis that extended from the distal two thirds of the mid vessel stent into the mid LAD.  Sequential angioplasty performed but the balloon tended to watermelon seed into the mid LAD.  Further use was discontinued.  A 2.5 x 15 mm Wolverine cutting balloon was utilized.  This treated the in-stent restenosis as well as the 12 mm segment beyond the mid vessel stent.  This was followed by treatment with a 3.0 x 20 mm noncompliant Emerge balloon.  This was taken up to 6 atm then 8 atm.   There was marked improvement of the in-stent restenosis in the stenosis in the native mid LAD in the unstented segment.  The digits appear to be a small plaque disruption proximal to the third stent in the mid to distal LAD.   It was elected to  treat this with drug-eluting stent.  A 2.5 x 20 mm Synergy drug-eluting stent was obtained and overlapped with the third stent and deployed at 15 atm.  A drug-coated balloon was then brought in to treat the segment of in-stent restenosis with a previously displayed stent in the mid LAD.  This drug-coated balloon covered the distal two thirds of the stent and also of the mid LAD beyond the stent.  It was inflated to 6 atm and held for 1 minute.  Final angiography was performed revealing excellent angiographic result.  Resolution of in-stent restenosis, resolution of plaque disruption in the mid LAD treated with a drug-eluting stent.  TIMI-3 flow throughout the LAD.   Culprit vessel #1 mid LAD  Initial stenosis: In-stent restenosis within the second stent in the mid LAD extending into the mid LAD.  High-grade stenosis, eccentric, type  B, mild visible calcification, TIMI-3 flow. Additional stenosis/plaque disruption in the mid/mid to distal LAD above the third previously deployed stent, lesion length 15 mm, type B stenosis, TIMI-3 flow  Final angiography: No residual stenosis within in-stent restenosis within mid LAD following  treatment with angioplasty, Cutting Balloon atherotomy and drug coated balloon. No residual stenosis within the mid LAD above previously deployed stent in the mid to distal LAD.  Treated with drug-eluting stent overlapped with the previous stent in the mid to distal segment. TIMI-3 flow throughout the LAD.  No residual obstructive stenosis.  CONCLUSIONS: non-ST elevation myocardial infarction Multivessel coronary artery disease with severe  obstructive stenosis within the mid and mid to distal LAD as culprit vessel/stenoses Successful intervention with angioplasty, balloon atherotomy and drug coated balloon to the mid LAD and stenting of the mid to distal LAD Hypertension Hyperlipidemia Diabetes mellitus with poor control with hemoglobin A1c  14  RECOMMENDATIONS: Aggressive risk factor modification , Guideline directed medical therapy. Continue ongoing dual antiplatelet therapy with aspirin  and Plavix   Electronically signed: Norleen CHRISTELLA Solar, MD 01/30/2024 / 4:34 PM [facility]     Laboratory Data: High Sensitivity Troponin:  No results for input(s): TROPONINIHS in the last 720 hours.  Recent Labs  Lab 02/16/24 1315  TRNPT <15      Chemistry Recent Labs  Lab 02/16/24 1315  NA 133*  K 4.2  CL 99  CO2 20*  GLUCOSE 261*  BUN 23*  CREATININE 1.16*  CALCIUM  10.2  GFRNONAA 55*  ANIONGAP 15    No results for input(s): PROT, ALBUMIN, AST, ALT, ALKPHOS, BILITOT in the last 168 hours. Lipids No results for input(s): CHOL, TRIG, HDL, LABVLDL, LDLCALC, CHOLHDL in the last 168 hours.  Hematology Recent Labs  Lab 02/16/24 1315  WBC 8.4  RBC 5.47*  HGB 14.3  HCT 44.3  MCV 81.0  MCH 26.1  MCHC 32.3  RDW 14.2  PLT 273   Thyroid No results for input(s): TSH, FREET4 in the last 168 hours.  BNPNo results for input(s): BNP, PROBNP in the last 168 hours.  DDimer No results for input(s): DDIMER in the last 168 hours.  Radiology/Studies:  DG Chest 2 View Result Date: 02/16/2024 EXAM: 2 VIEW(S) XRAY OF THE CHEST 02/16/2024 01:48:00 PM COMPARISON: 02/26/2023 CLINICAL HISTORY: CP FINDINGS: LUNGS AND PLEURA: Low lung volume. No focal pulmonary opacity. No pleural effusion. No pneumothorax. HEART AND MEDIASTINUM: No acute abnormality of the cardiac and mediastinal silhouettes. BONES AND SOFT TISSUES: No acute osseous abnormality. IMPRESSION: 1. Low lung volumes. 2. No acute findings. Electronically signed by: Waddell Calk MD 02/16/2024 02:14 PM EST RP Workstation: [id number]     Assessment and Plan:  1. Chest pressure, epigastric discomfort with mixed features, sensation of elevated heart rate upon awakening, negative troponin x2 superimposed on known history of CAD s/p PCI  2010, recent low level NSTEMI s/p PCI to LAD in 01/30/24 2. Uncontrolled DM on insulin  pump 3. Chronically elevated LFTs 4. Essential HTN 5. Hyperlipidemia with recent increase in atorvastatin  6. Anxiety/depression 7. Snoring with suspected OSA - O2 sat appears 100% while awake, with episodic desaturations while sleeping   Patient seen/examined with Dr. Jeffrie. Patient presents with continuous chest pressure, as well as sensation of heart racing upon awakening. She is in NSR here with normal troponins. Would expect if there was substantial ischemia actively occurring to see some bump in the level by now. Per discussion with Dr. Jeffrie, would increase Imdur  from 30mg  to 60mg  daily. Follow  on telemetry. Suspect the HR response she experienced may have been related to untreated OSA, but need to exclude transient arrhythmia. Would strongly recommend sleep study; previously referred.   Would continue with the cardiac medications otherwise as outlined from recent [facility] discharge. Note this medicine list is different than what we have listed here historically. She denies any missed doses of DAPT and should be continued on such. Recommend medicine admission given comorbidities and extensive home medication list.  Patient did not yet take her ASA, Plavix  or Imdur  for the day, but got 325mg  ASA with EMS. Will order the Imdur  and Plavix  to be given now. Also relayed to ED team.   Risk Assessment/Risk Scores:       For questions or updates, please contact [facility] Please consult [email or web address] for contact info under      Signed, Dayna N Dunn, PA-C  02/16/2024 6:20 PM  Personally seen and examined. Agree with above.  52 year old with recent discharge from [facility] on 5 December after PCI to LAD in the setting of troponin 17-19 with chest pain.  PE negative CT.  She was started on carvedilol 25 mg twice a day as well as isosorbide  30 mg a day in that setting.  She does state that she has  been taking these medications.  She struggles with her diabetic control, has insulin  pump and despite this has an A1c of 14.  On exam, she appears comfortable, alert and oriented x 3, regular rate and rhythm, normal lungs no crackles, no significant edema.  Troponin here is negative x 2.  EKG no signs of ischemia, no ST segment changes.  Plan will be to observe her overnight to ensure stability.  Thankfully troponins are normal indicating that she is not experiencing acute coronary syndrome especially in the setting of her recent LAD stent.  There is no evidence of stent thrombosis.  Continue with dual antiplatelet therapy.  We will increase her isosorbide  from 30-60 to hopefully provide better antianginal support.  However, her chest discomfort may be multifactorial including musculoskeletal or perhaps GI.  We will follow-up.  Oneil Parchment, MD     [1]  Allergies Allergen Reactions   Tramadol     Other reaction(s): Mental Status Changes (intolerance) Caused Hallucinations   Nsaids Other (See Comments)    Cannot take because she's on Plavix .   Other reaction(s): Bleeding (intolerance) Cannot take because she's on Plavix .    Sulfamethoxazole-Trimethoprim Nausea And Vomiting    History of Colitis   Ibuprofen     Not an allergy. Pt stated she can't take it because of current use of ASA and Plavix 

## 2024-02-16 NOTE — ED Notes (Signed)
 Pt given sandwich bag and drink. Pt is okay to PO per Admitting provider.

## 2024-02-16 NOTE — ED Provider Notes (Addendum)
 Assume Care - Medical Decision Making  Care of patient assumed from previous emergency medicine provider at 1500. See their note for further details of history, physical exam and plan.  Briefly, Jean Calderon is a 53 y.o. female who presents with: Chest pain and shortness of breath that started last night and got progressively worse throughout the night.  Clinical Course as of 02/16/24 1951  Thu Feb 16, 2024  1451 Unstable angina, pending cardiac recs [SE]  1516 Spoke with cardiology [CB]  1949 Dw Dr Debby from hospitalist for admission. [RP]    Clinical Course User Index [CB] Beola Terrall RAMAN, PA-C [RP] Yolande Lamar BROCKS, MD [SE] Guillermina Hamilton, MD     Reassessment: I personally reassessed the patient:  Vital Signs:  The most current vitals were  Vitals:   02/16/24 1252 02/16/24 1406  BP: 117/72   Pulse: 93   Resp: 20   Temp: 98.3 F (36.8 C)   TempSrc: Oral   SpO2: 98%   Weight:  120.2 kg  Height:  5' 9 (1.753 m)   Hemodynamics:  The patient is hemodynamically stable. Mental Status:  The patient is alert  Additional MDM/ED Course: Patient ended up in hemodynamically stable condition.  Cardiology recommending admission for observation. CBC here with no significant leukocytosis or anemia.  BMP with mild hyponatremia but stable creatinine.  Spoke with the hospitalist team who agrees.  Patient given home Plavix  and Imdur  today given she has not taken it.  Patient admitted to hospitalist service for further workup and management.  Disposition:  I discussed the case with Dr.Thomas who graciously agreed to admit the patient to their service for continued care.   Clinical Impression:  1. Unstable angina [facility])     The plan for this patient was discussed with Dr. Yolande, who voiced agreement and who oversaw evaluation and treatment of this patient.   Electronically signed by:   Hamilton Carlin Guillermina, M.D. PGY-2, Emergency Medicine     Guillermina Hamilton, MD 02/16/24  2109    Yolande Lamar BROCKS, MD 02/16/24 2220

## 2024-02-16 NOTE — ED Notes (Signed)
 Pt self-administered 1 SL nitroglycerin  in EMS triage area

## 2024-02-17 ENCOUNTER — Inpatient Hospital Stay (HOSPITAL_COMMUNITY)

## 2024-02-17 DIAGNOSIS — E1165 Type 2 diabetes mellitus with hyperglycemia: Secondary | ICD-10-CM | POA: Diagnosis not present

## 2024-02-17 DIAGNOSIS — Z6838 Body mass index (BMI) 38.0-38.9, adult: Secondary | ICD-10-CM | POA: Diagnosis not present

## 2024-02-17 DIAGNOSIS — E669 Obesity, unspecified: Secondary | ICD-10-CM

## 2024-02-17 DIAGNOSIS — R7989 Other specified abnormal findings of blood chemistry: Secondary | ICD-10-CM | POA: Diagnosis not present

## 2024-02-17 DIAGNOSIS — R079 Chest pain, unspecified: Secondary | ICD-10-CM | POA: Diagnosis not present

## 2024-02-17 DIAGNOSIS — Z9861 Coronary angioplasty status: Secondary | ICD-10-CM

## 2024-02-17 DIAGNOSIS — I10 Essential (primary) hypertension: Secondary | ICD-10-CM

## 2024-02-17 DIAGNOSIS — Z794 Long term (current) use of insulin: Secondary | ICD-10-CM | POA: Diagnosis not present

## 2024-02-17 DIAGNOSIS — R072 Precordial pain: Secondary | ICD-10-CM | POA: Diagnosis not present

## 2024-02-17 DIAGNOSIS — R0789 Other chest pain: Secondary | ICD-10-CM | POA: Diagnosis not present

## 2024-02-17 DIAGNOSIS — E66812 Obesity, class 2: Secondary | ICD-10-CM | POA: Diagnosis not present

## 2024-02-17 LAB — CBG MONITORING, ED
Glucose-Capillary: 304 mg/dL — ABNORMAL HIGH (ref 70–99)
Glucose-Capillary: 322 mg/dL — ABNORMAL HIGH (ref 70–99)
Glucose-Capillary: 344 mg/dL — ABNORMAL HIGH (ref 70–99)
Glucose-Capillary: 356 mg/dL — ABNORMAL HIGH (ref 70–99)

## 2024-02-17 LAB — COMPREHENSIVE METABOLIC PANEL WITH GFR
ALT: 41 U/L (ref 0–44)
AST: 47 U/L — ABNORMAL HIGH (ref 15–41)
Albumin: 4.2 g/dL (ref 3.5–5.0)
Alkaline Phosphatase: 52 U/L (ref 38–126)
Anion gap: 13 (ref 5–15)
BUN: 23 mg/dL — ABNORMAL HIGH (ref 6–20)
CO2: 22 mmol/L (ref 22–32)
Calcium: 10.2 mg/dL (ref 8.9–10.3)
Chloride: 98 mmol/L (ref 98–111)
Creatinine, Ser: 1.21 mg/dL — ABNORMAL HIGH (ref 0.44–1.00)
GFR, Estimated: 53 mL/min — ABNORMAL LOW
Glucose, Bld: 340 mg/dL — ABNORMAL HIGH (ref 70–99)
Potassium: 4 mmol/L (ref 3.5–5.1)
Sodium: 133 mmol/L — ABNORMAL LOW (ref 135–145)
Total Bilirubin: 0.4 mg/dL (ref 0.0–1.2)
Total Protein: 7.4 g/dL (ref 6.5–8.1)

## 2024-02-17 LAB — MRSA NEXT GEN BY PCR, NASAL: MRSA by PCR Next Gen: NOT DETECTED

## 2024-02-17 LAB — CBC
HCT: 42.2 % (ref 36.0–46.0)
Hemoglobin: 13.8 g/dL (ref 12.0–15.0)
MCH: 26.5 pg (ref 26.0–34.0)
MCHC: 32.7 g/dL (ref 30.0–36.0)
MCV: 81 fL (ref 80.0–100.0)
Platelets: 253 K/uL (ref 150–400)
RBC: 5.21 MIL/uL — ABNORMAL HIGH (ref 3.87–5.11)
RDW: 14.5 % (ref 11.5–15.5)
WBC: 8.8 K/uL (ref 4.0–10.5)
nRBC: 0 % (ref 0.0–0.2)

## 2024-02-17 LAB — HEMOGLOBIN A1C
Hgb A1c MFr Bld: 12.6 % — ABNORMAL HIGH (ref 4.8–5.6)
Hgb A1c MFr Bld: 12.5 % — ABNORMAL HIGH (ref 4.8–5.6)
Mean Plasma Glucose: 314.92 mg/dL
Mean Plasma Glucose: 312.05 mg/dL

## 2024-02-17 LAB — ECHOCARDIOGRAM COMPLETE
Area-P 1/2: 2.35 cm2
Height: 69 in
Weight: 4240 [oz_av]

## 2024-02-17 LAB — TROPONIN T, HIGH SENSITIVITY
Troponin T High Sensitivity: <15 ng/L (ref 0–19)
Troponin T High Sensitivity: <15 ng/L (ref 0–19)

## 2024-02-17 LAB — GLUCOSE, CAPILLARY
Glucose-Capillary: 336 mg/dL — ABNORMAL HIGH (ref 70–99)
Glucose-Capillary: 353 mg/dL — ABNORMAL HIGH (ref 70–99)

## 2024-02-17 MED ORDER — PERFLUTREN LIPID MICROSPHERE
1.0000 mL | INTRAVENOUS | Status: AC | PRN
  Administered 2024-02-17: 4 mL via INTRAVENOUS

## 2024-02-17 MED ORDER — SERTRALINE HCL 100 MG PO TABS
100.0000 mg | ORAL_TABLET | Freq: Every day | ORAL | Status: DC
  Administered 2024-02-17 – 2024-02-18 (×2): 100 mg via ORAL
  Filled 2024-02-17 (×2): qty 1

## 2024-02-17 MED ORDER — INSULIN GLARGINE 100 UNIT/ML ~~LOC~~ SOLN
20.0000 [IU] | Freq: Every day | SUBCUTANEOUS | Status: DC
  Administered 2024-02-17 – 2024-02-18 (×2): 20 [IU] via SUBCUTANEOUS
  Filled 2024-02-17 (×2): qty 0.2

## 2024-02-17 MED ORDER — MONTELUKAST SODIUM 10 MG PO TABS
10.0000 mg | ORAL_TABLET | Freq: Every day | ORAL | Status: DC
  Administered 2024-02-17: 10 mg via ORAL
  Filled 2024-02-17: qty 1

## 2024-02-17 MED ORDER — ALUM & MAG HYDROXIDE-SIMETH 200-200-20 MG/5ML PO SUSP: 30.0000 mL | ORAL | Status: DC | PRN

## 2024-02-17 MED ORDER — NITROGLYCERIN 2 % TD OINT
0.5000 [in_us] | TOPICAL_OINTMENT | Freq: Four times a day (QID) | TRANSDERMAL | Status: DC
  Administered 2024-02-17 – 2024-02-18 (×6): 0.5 [in_us] via TOPICAL
  Filled 2024-02-17: qty 1
  Filled 2024-02-17: qty 30
  Filled 2024-02-17 (×2): qty 1

## 2024-02-17 MED ORDER — INFLUENZA VIRUS VACC SPLIT PF (FLUZONE) 0.5 ML IM SUSY
0.5000 mL | PREFILLED_SYRINGE | INTRAMUSCULAR | Status: AC
  Administered 2024-02-18: 0.5 mL via INTRAMUSCULAR
  Filled 2024-02-17: qty 0.5

## 2024-02-17 MED ORDER — OXYCODONE HCL 5 MG PO TABS
5.0000 mg | ORAL_TABLET | Freq: Four times a day (QID) | ORAL | Status: DC | PRN
  Administered 2024-02-17 (×2): 5 mg via ORAL
  Filled 2024-02-17 (×4): qty 1

## 2024-02-17 MED ORDER — LORAZEPAM 2 MG/ML IJ SOLN
0.5000 mg | Freq: Once | INTRAMUSCULAR | Status: AC
  Administered 2024-02-17: 0.5 mg via INTRAVENOUS
  Filled 2024-02-17: qty 1

## 2024-02-17 MED ORDER — DIPHENHYDRAMINE HCL 25 MG PO CAPS
25.0000 mg | ORAL_CAPSULE | Freq: Three times a day (TID) | ORAL | Status: DC | PRN
  Administered 2024-02-17 – 2024-02-18 (×3): 25 mg via ORAL
  Filled 2024-02-17 (×3): qty 1

## 2024-02-17 MED ORDER — IPRATROPIUM-ALBUTEROL 0.5-2.5 (3) MG/3ML IN SOLN: 3.0000 mL | RESPIRATORY_TRACT | Status: DC | PRN

## 2024-02-17 NOTE — ED Notes (Signed)
 Secure chat sent to Provider per request from pt. She would like to speak to him in person

## 2024-02-17 NOTE — Progress Notes (Signed)
"  °  Progress Note  Patient Name: Jean Calderon Date of Encounter: 02/17/2024 [facility] Cardiologist: Redell Shallow, MD   Interval Summary   Vomited a few minutes ago.  Vital Signs Vitals:   02/17/24 0545 02/17/24 0548 02/17/24 0615 02/17/24 0856  BP:   115/64 (!) 124/52  Pulse: 90  89 83  Resp: 16  14   Temp:  97.7 F (36.5 C)  98.2 F (36.8 C)  TempSrc:    Axillary  SpO2: 97%  97% 99%  Weight:      Height:       No intake or output data in the 24 hours ending 02/17/24 0945    02/16/2024    2:06 PM 12/28/2023    9:52 AM 12/21/2023    1:51 PM  Last 3 Weights  Weight (lbs) 265 lb 270 lb 273 lb 5.9 oz  Weight (kg) 120.203 kg 122.471 kg 124 kg      Telemetry/ECG  No adverse arrhythmias- Personally Reviewed  Physical Exam  GEN: No acute distress.   Neck: No JVD Cardiac: RRR, no murmurs, rubs, or gallops.  Respiratory: Clear to auscultation bilaterally. GI: Soft, nontender, non-distended  MS: No edema  Assessment & Plan   53 year old female with coronary artery disease recent PCI to LAD in the setting of troponin of 17-19 with chest pain just discharged from [facility] on December fifth here with chest pain, epigastric discomfort.  PE CT was negative at outside hospital.  She was started on carvedilol 25 mg twice a day as well as isosorbide  30 mg a day at [facility].  She has been taking these new medications.  She struggles with diabetes control A1c is 14.  Chest pain - Thankfully troponins are all normal, x 3.  No signs of myocardial injury.  EKG no ischemia. -No further cardiac workup necessary. - We will increase her isosorbide  to 60 mg just for antianginal support.  Nausea vomiting/uncontrolled diabetes - Likely related to her uncontrolled diabetes, perhaps gastroparesis.  Encourage tighter outpatient blood glucose control.  She has an insulin  pump.  She is working with an endocrinologist.  Hypertension - Continue current medication  management  Hyperlipidemia - Continue with recent increase in atorvastatin .  Chronically elevated LFTs - Per primary team.  We are going to sign off.  Please let us  know if we can be of further assistance.  No further cardiology workup needed.   For questions or updates, please contact [facility] Please consult [email or web address] for contact info under         Signed, Oneil Parchment, MD   "

## 2024-02-17 NOTE — ED Notes (Signed)
 Pt belongings - purse, phone, tablet and clothing sent with pt.

## 2024-02-17 NOTE — ED Notes (Signed)
 Provider Darci Pore MD at bedside

## 2024-02-17 NOTE — ED Notes (Addendum)
 Pt requesting to hold off am medications due to nausea. Advised pt to call RN once she feels better. Pt verbalized understanding

## 2024-02-17 NOTE — ED Notes (Signed)
 Per pt she had an episode of vomiting in the bathroom

## 2024-02-17 NOTE — Progress Notes (Signed)
 " Progress Note   Patient: Jean Calderon FMW:[id number] DOB: 1970/06/03 DOA: 02/16/2024     1 DOS: the patient was seen and examined on 02/17/2024   Brief hospital course: Jean Calderon is a 53 y.o. female with medical history significant of  hx of CAD s/p remote PCI 2010,with recent NSTEMI s/p PCI to LAD 01/30/24 at [facility], PVD, CKD IIIa, uncontrolled IDDM, HTN, HLD, elevated LFTs, radiculopathy, neuropathy, arthritis, anxiety, depression, snoring, former tobacco use presented for chest pain. Admitted to TR[facility] service for further management.  Assessment and Plan: Atypical chest pain CAD s/p PCI DES LAD- She has epigastric, left lower chest pain. Seem more like gas discomfort, has tenderness over LUQ abdomen. Trial of maalox ordered. Continue PPI. Troponin negative, EKG unremarkable. Cardiology evaluation and follow up appreciated. Imdur  dose increased to 60mg .  Uncontrolled type 2 diabetes- Blood sugars high >300. A1c 12.6. Continue Novolog  15 tid, accucheks, sliding scale. Semglee  20u daily ordered. She has insulin  pump, will need the settings. DM educator consult called for education, insulin  pump management as outpatient. RN noted that she is eating excess likely causing her abdominal discomfort, nausea. Calorie restricted diet advised.  NASH LFT stable. Outpatient work up, weight reduction advised.  Essential hypertension BP stable. Continue antihypertensive meds, metoprolol , imdur .  Hyperlipidemia Continue statin.  Anxiety/ depression Zoloft  resumed.  Possible OSA Obesity class II BMI 39.13 Diet, exercise and weight reduction advised, Outpatient sleep study suggested.      Out of bed to chair. Incentive spirometry. Nursing supportive care. Fall, aspiration precautions. Diet:  Diet Orders (From admission, onward)     Start     Ordered   02/17/24 1113  Diet heart healthy/carb modified Room service appropriate? Yes; Fluid consistency: Thin  Diet effective now        Comments: Calorie restriction to 1800kcal/dal  Question Answer Comment  Diet-HS Snack? Nothing   Room service appropriate? Yes   Fluid consistency: Thin      02/17/24 1113           DVT prophylaxis: heparin  injection 5,000 Units Start: 02/16/24 2200  Level of care: Progressive   Code Status: Full Code  Subjective: Patient is seen and examined today morning. She has LUQ, lower chest pain, tenderness. Has gas discomfort. Has nausea. Able to tolerate diet.  Physical Exam: Vitals:   02/17/24 0545 02/17/24 0548 02/17/24 0615 02/17/24 0856  BP:   115/64 (!) 124/52  Pulse: 90  89 83  Resp: 16  14   Temp:  97.7 F (36.5 C)  98.2 F (36.8 C)  TempSrc:    Axillary  SpO2: 97%  97% 99%  Weight:      Height:        General - Middle aged obese African American female, distress due to pain, nausea HEENT - PERRLA, EOMI, atraumatic head, non tender sinuses. Lung - Clear, basal rales, rhonchi, no wheezes. Heart - S1, S2 heard, no murmurs, rubs, trace pedal edema. Abdomen - Soft, non tender, bowel sounds good Neuro - Alert, awake and oriented x 3, non focal exam. Skin - Warm and dry.  Data Reviewed:      Latest Ref Rng & Units 02/17/2024    3:41 AM 02/16/2024    7:53 PM 02/16/2024    1:15 PM  CBC  WBC 4.0 - 10.5 K/uL 8.8  8.8  8.4   Hemoglobin 12.0 - 15.0 g/dL 86.1  86.5  85.6   Hematocrit 36.0 - 46.0 % 42.2  42.4  44.3  Platelets 150 - 400 K/uL 253  292  273       Latest Ref Rng & Units 02/17/2024    3:41 AM 02/16/2024    7:53 PM 02/16/2024    1:15 PM  BMP  Glucose 70 - 99 mg/dL 659   738   BUN 6 - 20 mg/dL 23   23   Creatinine 9.55 - 1.00 mg/dL 8.78  8.81  8.83   Sodium 135 - 145 mmol/L 133   133   Potassium 3.5 - 5.1 mmol/L 4.0   4.2   Chloride 98 - 111 mmol/L 98   99   CO2 22 - 32 mmol/L 22   20   Calcium  8.9 - 10.3 mg/dL 89.7   89.7    DG Chest 2 View Result Date: 02/16/2024 EXAM: 2 VIEW(S) XRAY OF THE CHEST 02/16/2024 01:48:00 PM COMPARISON: 02/26/2023  CLINICAL HISTORY: CP FINDINGS: LUNGS AND PLEURA: Low lung volume. No focal pulmonary opacity. No pleural effusion. No pneumothorax. HEART AND MEDIASTINUM: No acute abnormality of the cardiac and mediastinal silhouettes. BONES AND SOFT TISSUES: No acute osseous abnormality. IMPRESSION: 1. Low lung volumes. 2. No acute findings. Electronically signed by: Waddell Calk MD 02/16/2024 02:14 PM EST RP Workstation: [id number]    Family Communication: Discussed with patient. She understand and agree. All questions answered.  Disposition: Status is: Inpatient Remains inpatient appropriate because: chest pain work up, monitor blood sugars.  Planned Discharge Destination: Home     Time spent: 43 minutes  Author: Concepcion Riser, MD 02/17/2024 11:21 AM Secure chat 7am to 7pm For on call review [email or web address].    "

## 2024-02-17 NOTE — Plan of Care (Signed)
" °  Problem: Tissue Perfusion: Goal: Adequacy of tissue perfusion will improve Outcome: Progressing   Problem: Education: Goal: Knowledge of General Education information will improve Description: Including pain rating scale, medication(s)/side effects and non-pharmacologic comfort measures Outcome: Progressing   Problem: Elimination: Goal: Will not experience complications related to urinary retention Outcome: Progressing   Problem: Pain Managment: Goal: General experience of comfort will improve and/or be controlled Outcome: Progressing   "

## 2024-02-17 NOTE — Progress Notes (Signed)
 Echocardiogram 2D Echocardiogram has been performed.  Jean Calderon 02/17/2024, 3:50 PM

## 2024-02-17 NOTE — Inpatient Diabetes Management (Addendum)
 Inpatient Diabetes Program Recommendations  AACE/ADA: New Consensus Statement on Inpatient Glycemic Control (2015)  Target Ranges:  Prepandial:   less than 140 mg/dL      Peak postprandial:   less than 180 mg/dL (1-2 hours)      Critically ill patients:  140 - 180 mg/dL   Lab Results  Component Value Date   GLUCAP 344 (H) 02/17/2024   HGBA1C 12.6 (H) 02/16/2024    Review of Glycemic Control  Latest Reference Range & Units 02/16/24 20:06 02/17/24 00:16 02/17/24 03:28 02/17/24 07:56  Glucose-Capillary 70 - 99 mg/dL 876 (H) 643 (H) 677 (H) 344 (H)   Diabetes history: DM2 Outpatient Diabetes medications:  Dexcom G7 Insulin  pump-Omnipod BG correction threshold 110  BG target range 110  Sensitivity 20  Carb ratio 10  Reverse correction turned OFF  Jardiance  25 mg daily Current orders for Inpatient glycemic control:  Novolog  0-9 units q 4 hours Jardiance  25 mg daily Novolog  15 units tid with meals  Inpatient Diabetes Program Recommendations:    Per RN, patient states she took off insulin  pump when she arrived to ED.  Last note with endocrinology notes that they are trying to transition her to T-Slim pump.  Since insulin  pump is off, recommend adding basal insulin  to insulin  orders as well.   Will follow.  Addendum 1300- Spoke to patient by phone regarding DM management. She states that she does not know what her pump settings are.  She states she was told to remove insulin  pump when she arrived in the ED.  Discussed current insulin  orders and that basal plus bolus insulin  will be given.  A1C is slightly better than November of 2025 (14.2%).  Thanks,  Randall Bullocks, RN, BC-ADM Inpatient Diabetes Coordinator Pager [phone]  (8a-5p)

## 2024-02-18 ENCOUNTER — Other Ambulatory Visit: Payer: Self-pay

## 2024-02-18 ENCOUNTER — Other Ambulatory Visit (HOSPITAL_COMMUNITY): Payer: Self-pay

## 2024-02-18 ENCOUNTER — Encounter (HOSPITAL_COMMUNITY): Payer: Self-pay | Admitting: Internal Medicine

## 2024-02-18 DIAGNOSIS — E66812 Obesity, class 2: Secondary | ICD-10-CM | POA: Diagnosis not present

## 2024-02-18 DIAGNOSIS — Z6838 Body mass index (BMI) 38.0-38.9, adult: Secondary | ICD-10-CM

## 2024-02-18 DIAGNOSIS — I10 Essential (primary) hypertension: Secondary | ICD-10-CM | POA: Diagnosis not present

## 2024-02-18 DIAGNOSIS — R7989 Other specified abnormal findings of blood chemistry: Secondary | ICD-10-CM | POA: Diagnosis not present

## 2024-02-18 DIAGNOSIS — R0789 Other chest pain: Secondary | ICD-10-CM

## 2024-02-18 LAB — GLUCOSE, CAPILLARY
Glucose-Capillary: 239 mg/dL — ABNORMAL HIGH (ref 70–99)
Glucose-Capillary: 242 mg/dL — ABNORMAL HIGH (ref 70–99)
Glucose-Capillary: 250 mg/dL — ABNORMAL HIGH (ref 70–99)
Glucose-Capillary: 260 mg/dL — ABNORMAL HIGH (ref 70–99)

## 2024-02-18 MED ORDER — ALUMINUM & MAGNESIUM HYDROXIDE 200-200 MG/5ML PO SUSP
10.0000 mL | Freq: Four times a day (QID) | ORAL | 0 refills | Status: AC | PRN
  Filled 2024-02-18: qty 355, 9d supply, fill #0

## 2024-02-18 MED ORDER — ISOSORBIDE MONONITRATE ER 30 MG PO TB24
60.0000 mg | ORAL_TABLET | Freq: Every day | ORAL | 1 refills | Status: DC
  Filled 2024-02-18: qty 60, 30d supply, fill #0

## 2024-02-18 MED ORDER — HYDROCERIN EX CREA
TOPICAL_CREAM | Freq: Two times a day (BID) | CUTANEOUS | Status: DC | PRN
  Filled 2024-02-18: qty 113

## 2024-02-18 MED ORDER — HYDROXYZINE HCL 25 MG PO TABS
25.0000 mg | ORAL_TABLET | Freq: Once | ORAL | Status: AC
  Administered 2024-02-18: 25 mg via ORAL
  Filled 2024-02-18: qty 1

## 2024-02-18 MED ORDER — INSULIN GLARGINE 100 UNIT/ML ~~LOC~~ SOLN: 30.0000 [IU] | Freq: Every day | SUBCUTANEOUS | Status: DC

## 2024-02-18 NOTE — Progress Notes (Signed)
 TRH night cross cover note:  The patient is reporting Pruritus refractory to as needed Benadryl . I subsequently ordered hydroxyzine  x 1 now and add prn Eucerin cream for topical application to affected areas to address any pruritic contribution from dry skin.    Eva Pore, DO Hospitalist

## 2024-02-18 NOTE — Inpatient Diabetes Management (Addendum)
 Inpatient Diabetes Program Recommendations  AACE/ADA: New Consensus Statement on Inpatient Glycemic Control (2015)  Target Ranges:  Prepandial:   less than 140 mg/dL      Peak postprandial:   less than 180 mg/dL (1-2 hours)      Critically ill patients:  140 - 180 mg/dL   Lab Results  Component Value Date   GLUCAP 239 (H) 02/18/2024   HGBA1C 12.5 (H) 02/17/2024    Review of Glycemic Control  Latest Reference Range & Units 02/17/24 07:56 02/17/24 11:46 02/17/24 16:36 02/17/24 20:32 02/18/24 00:06 02/18/24 04:24 02/18/24 07:57  Glucose-Capillary 70 - 99 mg/dL 655 (H) 695 (H) 663 (H) 353 (H) 250 (H) 260 (H) 239 (H)  (H): Data is abnormally high  Diabetes history: DM2 Outpatient Diabetes medications:  Dexcom G7 Insulin  pump-Omnipod BG correction threshold 110  BG target range 110  Sensitivity 20  Carb ratio 10  Reverse correction turned OFF  Jardiance  25 mg daily Current orders for Inpatient glycemic control:  Novolog  0-9 units q 4 hours Jardiance  25 mg daily Novolog  15 units tid with meals  Semglee  20 units every day-increasing to 30 units tomorrow  Inpatient Diabetes Program Recommendations:    Noted MD increased basal insulin  to 30 units starting tomorrow.  Might consider:  Semglee  10 units X 1 now as she has already received 20 units this morning. Increase Novolog  MCTID to 18 units if she consumes at least 50%.  Thank you, Wyvonna Pinal, MSN, CDCES Diabetes Coordinator Inpatient Diabetes Program [phone] (team pager from 8a-5p)

## 2024-02-18 NOTE — Discharge Summary (Signed)
 " Physician Discharge Summary   Patient: Jean Calderon MRN: [id number] DOB: 02-06-1971  Admit date:     02/16/2024  Discharge date: {dischdate:26783}  Discharge Physician: Concepcion Riser   PCP: Lyle Wells Louder, FNP   Recommendations at discharge:  {Tip this will not be part of the note when signed- Example include specific recommendations for outpatient follow-up, pending tests to follow-up on. (Optional):26781}  PCP follow up in 1 week. Cardiology follow as scheduled.  Discharge Diagnoses: Principal Problem:   Chest pain Active Problems:   HTN (hypertension)   Class 2 severe obesity due to excess calories with serious comorbidity and body mass index (BMI) of 38.0 to 38.9 in adult   Hyperlipidemia   Poorly controlled type 2 diabetes mellitus with peripheral neuropathy (HCC)   Elevated LFTs  Resolved Problems:   * No resolved hospital problems. [facility]: No notes on file  Assessment and Plan: No notes have been filed under this hospital service. Service: Hospitalist     {Tip this will not be part of the note when signed Body mass index is 39.13 kg/m. , ,  (Optional):26781}  {(NOTE) Pain control PDMP Statment (Optional):26782} Consultants: *** Procedures performed: ***  Disposition: {Plan; Disposition:26390} Diet recommendation:  Discharge Diet Orders (From admission, onward)     Start     Ordered   02/18/24 0000  Diet Carb Modified        02/18/24 1055   02/18/24 0000  Diet - low sodium heart healthy        02/18/24 1055           {Diet_Plan:26776} DISCHARGE MEDICATION: Allergies as of 02/18/2024       Reactions   Tramadol    Other reaction(s): Mental Status Changes (intolerance) Caused Hallucinations   Nsaids Other (See Comments)   Cannot take because she's on Plavix .  Other reaction(s): Bleeding (intolerance) Cannot take because she's on Plavix .    Sulfamethoxazole-trimethoprim Nausea And Vomiting   History of Colitis    Ibuprofen    Not an allergy. Pt stated she can't take it because of current use of ASA and Plavix         Medication List     STOP taking these medications    metoprolol  succinate 100 MG 24 hr tablet Commonly known as: TOPROL -XL       TAKE these medications    acetaminophen  500 MG tablet Commonly known as: TYLENOL  Take 1 tablet (500 mg total) by mouth every 6 (six) hours as needed.   albuterol  108 (90 Base) MCG/ACT inhaler Commonly known as: VENTOLIN  HFA Inhale 2 puffs into the lungs every 4 (four) hours as needed.   aluminum -magnesium  hydroxide 200-200 MG/5ML suspension Take 10 mLs by mouth every 6 (six) hours as needed for indigestion.   amitriptyline  25 MG tablet Commonly known as: ELAVIL  Take 1 tablet (25 mg total) by mouth at bedtime. Take an hour before bed for nerve pain   aspirin  EC 81 MG tablet Take 81 mg by mouth daily.   atorvastatin  40 MG tablet Commonly known as: LIPITOR TAKE 1 TABLET(40 MG) BY MOUTH DAILY   carvedilol 25 MG tablet Commonly known as: COREG Take 25 mg by mouth daily.   clopidogrel  75 MG tablet Commonly known as: PLAVIX  Take 1 tablet (75 mg total) by mouth daily.   Dexcom G6 Transmitter Misc   Dexcom G7 Receiver Devi Check sugars daily Dx E11.9   Dexcom G7 Sensor Misc Check sugars daily Dx E11.9   estradiol  0.1  MG/24HR patch Commonly known as: VIVELLE -DOT Place 1 patch onto the skin 2 (two) times a week.   Estradiol -Norethindrone  Acet 0.5-0.1 MG tablet Take 1 tablet by mouth daily.   Fiasp  100 UNIT/ML Soln Generic drug: Insulin  Aspart (w/Niacinamide) To be used with insulin  pump.   fluticasone  50 MCG/ACT nasal spray Commonly known as: FLONASE  Place 1 spray into both nostrils daily as needed.   HumaLOG  100 UNIT/ML injection Generic drug: insulin  lispro INJECT 25 UNITS INTO THE SKIN 3 TIMES DAILY BEFORE MEALS. USE INSULIN  PUMP   hydrOXYzine  25 MG tablet Commonly known as: ATARAX  Take 25 mg by mouth 3 (three) times  daily.   isosorbide  mononitrate 30 MG 24 hr tablet Commonly known as: IMDUR  Take 2 tablets (60 mg total) by mouth daily. What changed: See the new instructions.   Jardiance  25 MG Tabs tablet Generic drug: empagliflozin  Take 1 tablet (25 mg total) by mouth daily.   ketoconazole  2 % shampoo Commonly known as: NIZORAL  Apply to affected areas 3 x a week for 8 weeks   lisinopril -hydrochlorothiazide  20-12.5 MG tablet Commonly known as: ZESTORETIC  Take 1 tablet by mouth daily.   mometasone  50 MCG/ACT nasal spray Commonly known as: NASONEX  One spray in each nostril twice a day, use left hand for right nostril, and right hand for left nostril.  Please dispense one bottle.   montelukast  10 MG tablet Commonly known as: SINGULAIR  TAKE 1 TABLET(10 MG) BY MOUTH AT BEDTIME   nitroGLYCERIN  0.4 MG SL tablet Commonly known as: NITROSTAT  Place 1 tablet (0.4 mg total) under the tongue every 5 (five) minutes as needed for chest pain.   nystatin  ointment Commonly known as: MYCOSTATIN  Apply 1 Application topically 2 (two) times daily.   olopatadine  0.1 % ophthalmic solution Commonly known as: Pataday  Place 1 drop into both eyes 2 (two) times daily.   Omnipod 5 DexG7G6 Pods Gen 5 Misc CHANGE POD EVERY OTHER DAY.   oxyCODONE -acetaminophen  5-325 MG tablet Commonly known as: PERCOCET/ROXICET Take 1 tablet by mouth every 8 (eight) hours as needed for severe pain (pain score 7-10).   pantoprazole  40 MG tablet Commonly known as: PROTONIX  Take 1 tablet (40 mg total) by mouth 2 (two) times daily.   pregabalin  200 MG capsule Commonly known as: LYRICA  TAKE 1 CAPSULE BY MOUTH TWICE DAILY What changed:  how much to take when to take this   progesterone 200 MG capsule Commonly known as: PROMETRIUM Take 200 mg by mouth at bedtime.   sertraline  100 MG tablet Commonly known as: ZOLOFT  Take 100 mg by mouth daily.   Skyrizi Pen 150 MG/ML pen Generic drug: risankizumab-rzaa Inject 150 mg into  the skin every 6 (six) months.   solifenacin 10 MG tablet Commonly known as: VESICARE Take 10 mg by mouth daily.   sucralfate  1 g tablet Commonly known as: CARAFATE  TAKE 1 TABLET(1 GRAM) BY MOUTH FOUR TIMES DAILY AT BEDTIME WITH MEALS   topiramate  100 MG tablet Commonly known as: TOPAMAX  Take by mouth 2 (two) times daily.   traZODone  100 MG tablet Commonly known as: DESYREL  TAKE 1 TABLET(100 MG) BY MOUTH AT BEDTIME AS NEEDED FOR SLEEP        Discharge Exam: Filed Weights   02/16/24 1406  Weight: 120.2 kg   ***  Condition at discharge: {DC Condition:26389}  The results of significant diagnostics from this hospitalization (including imaging, microbiology, ancillary and laboratory) are listed below for reference.   Imaging Studies: ECHOCARDIOGRAM COMPLETE Result Date: 02/17/2024    ECHOCARDIOGRAM REPORT  Patient Name:   Jean Calderon Date of Exam: 02/17/2024 Medical Rec #:  [id number]     Height:       69.0 in Accession #:    [id number]    Weight:       265.0 lb Date of Birth:  1970/06/20      BSA:          2.328 m Patient Age:    53 years      BP:           118/73 mmHg Patient Gender: F             HR:           86 bpm. Exam Location:  Inpatient Procedure: 2D Echo, Cardiac Doppler, Color Doppler and Intracardiac            Opacification Agent (Both Spectral and Color Flow Doppler were            utilized during procedure). Indications:    Chest Pain R07.9  History:        Patient has no prior history of Echocardiogram examinations.                 CAD, Signs/Symptoms:Chest Pain; Risk Factors:Hypertension,                 Dyslipidemia, Diabetes and Former Smoker.  Sonographer:    Merlynn Argyle Referring Phys: [id number] SARA-MAIZ A THOMAS  Sonographer Comments: Suboptimal subcostal window and suboptimal apical window. Image acquisition challenging due to patient body habitus and Image acquisition challenging due to respiratory motion. IMPRESSIONS  1. Technically difficult study with limited  views. Left ventricular ejection fraction, by estimation, is 60 to 65%. The left ventricle has normal function. The left ventricle has no regional wall motion abnormalities. Left ventricular diastolic parameters are indeterminate.  2. Right ventricular systolic function is normal. The right ventricular size is normal. Tricuspid regurgitation signal is inadequate for assessing PA pressure.  3. The mitral valve is normal in structure. Trivial mitral valve regurgitation.  4. The aortic valve was not well visualized. Aortic valve regurgitation is not visualized. No aortic stenosis is present. FINDINGS  Left Ventricle: Left ventricular ejection fraction, by estimation, is 60 to 65%. The left ventricle has normal function. The left ventricle has no regional wall motion abnormalities. Definity  contrast agent was given IV to delineate the left ventricular  endocardial borders. The left ventricular internal cavity size was normal in size. There is no left ventricular hypertrophy. Left ventricular diastolic parameters are indeterminate. Right Ventricle: The right ventricular size is normal. No increase in right ventricular wall thickness. Right ventricular systolic function is normal. Tricuspid regurgitation signal is inadequate for assessing PA pressure. Left Atrium: Left atrial size was normal in size. Right Atrium: Right atrial size was normal in size. Pericardium: Trivial pericardial effusion is present. Mitral Valve: The mitral valve is normal in structure. Trivial mitral valve regurgitation. Tricuspid Valve: The tricuspid valve is grossly normal. Tricuspid valve regurgitation is trivial. Aortic Valve: The aortic valve was not well visualized. Aortic valve regurgitation is not visualized. No aortic stenosis is present. Pulmonic Valve: The pulmonic valve was not well visualized. Pulmonic valve regurgitation is not visualized. Aorta: The aortic root and ascending aorta are structurally normal, with no evidence of  dilitation. IAS/Shunts: The interatrial septum was not well visualized.  LEFT VENTRICLE PLAX 2D LVIDd:         4.10 cm   Diastology LV PW:  1.00 cm   LV e' medial:    5.55 cm/s LV IVS:        1.00 cm   LV E/e' medial:  8.2 LVOT diam:     2.00 cm   LV e' lateral:   12.50 cm/s LV SV:         38        LV E/e' lateral: 3.6 LV SV Index:   16 LVOT Area:     3.14 cm LV IVRT:       88 msec  RIGHT VENTRICLE             IVC RV S prime:     11.30 cm/s  IVC diam: 1.20 cm TAPSE (M-mode): 1.5 cm LEFT ATRIUM           Index LA Vol (A2C): 21.5 ml 9.23 ml/m  AORTIC VALVE LVOT Vmax:   64.60 cm/s LVOT Vmean:  45.200 cm/s LVOT VTI:    0.122 m  AORTA Ao Root diam: 3.30 cm Ao Asc diam:  3.00 cm MITRAL VALVE MV Area (PHT): 2.35 cm    SHUNTS MV Decel Time: 323 msec    Systemic VTI:  0.12 m MV E velocity: 45.40 cm/s  Systemic Diam: 2.00 cm MV A velocity: 45.80 cm/s MV E/A ratio:  0.99 Lonni Nanas MD Electronically signed by Lonni Nanas MD Signature Date/Time: 02/17/2024/4:57:07 PM    Final    DG Chest 2 View Result Date: 02/16/2024 EXAM: 2 VIEW(S) XRAY OF THE CHEST 02/16/2024 01:48:00 PM COMPARISON: 02/26/2023 CLINICAL HISTORY: CP FINDINGS: LUNGS AND PLEURA: Low lung volume. No focal pulmonary opacity. No pleural effusion. No pneumothorax. HEART AND MEDIASTINUM: No acute abnormality of the cardiac and mediastinal silhouettes. BONES AND SOFT TISSUES: No acute osseous abnormality. IMPRESSION: 1. Low lung volumes. 2. No acute findings. Electronically signed by: Waddell Calk MD 02/16/2024 02:14 PM EST RP Workstation: [id number]    Microbiology: Results for orders placed or performed during the hospital encounter of 02/16/24  MRSA Next Gen by PCR, Nasal     Status: None   Collection Time: 02/17/24  5:42 PM   Specimen: Nasal Mucosa; Nasal Swab  Result Value Ref Range Status   MRSA by PCR Next Gen NOT DETECTED NOT DETECTED Final    Comment: (NOTE) The GeneXpert MRSA Assay (FDA approved for NASAL  specimens only), is one component of a comprehensive MRSA colonization surveillance program. It is not intended to diagnose MRSA infection nor to guide or monitor treatment for MRSA infections. Test performance is not FDA approved in patients less than 30 years old. Performed at [facility], [address], [place], [place] [address]     Labs: CBC: Recent Labs  Lab 02/16/24 1315 02/16/24 1953 02/17/24 0341  WBC 8.4 8.8 8.8  HGB 14.3 13.4 13.8  HCT 44.3 42.4 42.2  MCV 81.0 81.2 81.0  PLT 273 292 253   Basic Metabolic Panel: Recent Labs  Lab 02/16/24 1315 02/16/24 1953 02/17/24 0341  NA 133*  --  133*  K 4.2  --  4.0  CL 99  --  98  CO2 20*  --  22  GLUCOSE 261*  --  340*  BUN 23*  --  23*  CREATININE 1.16* 1.18* 1.21*  CALCIUM  10.2  --  10.2   Liver Function Tests: Recent Labs  Lab 02/17/24 0341  AST 47*  ALT 41  ALKPHOS 52  BILITOT 0.4  PROT 7.4  ALBUMIN 4.2   CBG: Recent Labs  Lab 02/17/24 2032 02/18/24 0006 02/18/24 0424 02/18/24 0757 02/18/24 1123  GLUCAP 353* 250* 260* 239* 242*    Discharge time spent: {LESS THAN/GREATER UYJW:73611} 30 minutes.  Signed: Concepcion Riser, MD [facility] 02/18/2024 "

## 2024-02-29 NOTE — Progress Notes (Unsigned)
 "    HPI: Follow-up coronary artery disease. Patient had previous PCI in 2010. Also with history of peripheral vascular disease.  ABIs January 2025 normal.  Arterial Dopplers January 2025 showed mild atherosclerosis on the right and left with no evidence of stenosis.  Admitted to [facility] December 2025 with non-ST elevation myocardial infarction.  Cardiac catheterization revealed 70% circumflex, 60% marginal, 50% ramus, 85% mid LAD, 50% right coronary artery.  Patient had PCI of the LAD at that time.  Readmitted here late December and troponins were normal.  Follow-up echocardiogram December 2025 showed normal LV function.  Since last seen she has had no chest pain similar to symptoms prior to PCI.  She has problems with right upper quadrant pain.  She has dyspnea with more vigorous activities.  No pedal edema or syncope.  Current Outpatient Medications  Medication Sig Dispense Refill   acetaminophen  (TYLENOL ) 500 MG tablet Take 1 tablet (500 mg total) by mouth every 6 (six) hours as needed. 30 tablet 0   albuterol  (VENTOLIN  HFA) 108 (90 Base) MCG/ACT inhaler Inhale 2 puffs into the lungs every 4 (four) hours as needed.     aluminum -magnesium  hydroxide 200-200 MG/5ML suspension Take 10 mLs by mouth every 6 (six) hours as needed for indigestion. 355 mL 0   amitriptyline  (ELAVIL ) 25 MG tablet Take 1 tablet (25 mg total) by mouth at bedtime. Take an hour before bed for nerve pain 20 tablet 0   aspirin  EC 81 MG tablet Take 81 mg by mouth daily.     atorvastatin  (LIPITOR) 40 MG tablet TAKE 1 TABLET(40 MG) BY MOUTH DAILY 90 tablet 1   carvedilol (COREG) 25 MG tablet Take 25 mg by mouth daily.     clopidogrel  (PLAVIX ) 75 MG tablet Take 1 tablet (75 mg total) by mouth daily. 90 tablet 1   Continuous Blood Gluc Receiver (DEXCOM G7 RECEIVER) DEVI Check sugars daily Dx E11.9 1 each 2   Continuous Blood Gluc Sensor (DEXCOM G7 SENSOR) MISC Check sugars daily Dx E11.9 1 each 5   Continuous Glucose Transmitter  (DEXCOM G6 TRANSMITTER) MISC      estradiol  (VIVELLE -DOT) 0.1 MG/24HR patch Place 1 patch onto the skin 2 (two) times a week.     Estradiol -Norethindrone  Acet 0.5-0.1 MG tablet Take 1 tablet by mouth daily. 84 tablet 0   fluticasone  (FLONASE ) 50 MCG/ACT nasal spray Place 1 spray into both nostrils daily as needed.     HUMALOG  100 UNIT/ML injection INJECT 25 UNITS INTO THE SKIN 3 TIMES DAILY BEFORE MEALS. USE INSULIN  PUMP 10 mL 1   hydrOXYzine  (ATARAX ) 25 MG tablet Take 25 mg by mouth 3 (three) times daily.     Insulin  Aspart, w/Niacinamide, (FIASP ) 100 UNIT/ML SOLN To be used with insulin  pump.     Insulin  Disposable Pump (OMNIPOD 5 G6 PODS, GEN 5,) MISC CHANGE POD EVERY OTHER DAY. 30 each 0   isosorbide  mononitrate (IMDUR ) 30 MG 24 hr tablet Take 2 tablets (60 mg total) by mouth daily. 60 tablet 1   JARDIANCE  25 MG TABS tablet Take 1 tablet (25 mg total) by mouth daily. 30 tablet 0   ketoconazole  (NIZORAL ) 2 % shampoo Apply to affected areas 3 x a week for 8 weeks 120 mL 1   lisinopril -hydrochlorothiazide  (ZESTORETIC ) 20-12.5 MG tablet Take 1 tablet by mouth daily.     mometasone  (NASONEX ) 50 MCG/ACT nasal spray One spray in each nostril twice a day, use left hand for right nostril, and right hand for left  nostril.  Please dispense one bottle. 1 g 6   montelukast  (SINGULAIR ) 10 MG tablet TAKE 1 TABLET(10 MG) BY MOUTH AT BEDTIME 30 tablet 3   nitroGLYCERIN  (NITROSTAT ) 0.4 MG SL tablet Place 1 tablet (0.4 mg total) under the tongue every 5 (five) minutes as needed for chest pain. 25 tablet 6   nystatin  ointment (MYCOSTATIN ) Apply 1 Application topically 2 (two) times daily. 30 g 2   olopatadine  (PATADAY ) 0.1 % ophthalmic solution Place 1 drop into both eyes 2 (two) times daily. 5 mL 3   oxyCODONE -acetaminophen  (PERCOCET/ROXICET) 5-325 MG tablet Take 1 tablet by mouth every 8 (eight) hours as needed for severe pain (pain score 7-10). 15 tablet 0   pantoprazole  (PROTONIX ) 40 MG tablet Take 1 tablet  (40 mg total) by mouth 2 (two) times daily. 180 tablet 1   pregabalin  (LYRICA ) 200 MG capsule TAKE 1 CAPSULE BY MOUTH TWICE DAILY (Patient taking differently: Take 400 mg by mouth at bedtime.) 60 capsule 5   progesterone (PROMETRIUM) 200 MG capsule Take 200 mg by mouth at bedtime.     sertraline  (ZOLOFT ) 100 MG tablet Take 100 mg by mouth daily.     SKYRIZI PEN 150 MG/ML pen Inject 150 mg into the skin every 6 (six) months.     solifenacin (VESICARE) 10 MG tablet Take 10 mg by mouth daily.     sucralfate  (CARAFATE ) 1 g tablet TAKE 1 TABLET(1 GRAM) BY MOUTH FOUR TIMES DAILY AT BEDTIME WITH MEALS 120 tablet 0   topiramate  (TOPAMAX ) 100 MG tablet Take by mouth 2 (two) times daily.     traZODone  (DESYREL ) 100 MG tablet TAKE 1 TABLET(100 MG) BY MOUTH AT BEDTIME AS NEEDED FOR SLEEP 90 tablet 0   No current facility-administered medications for this visit.     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 Severe tendinosis of the supraspinatus tendon.  2. Mild tendinosis of the infraspinatus tendon.  3. Thickening of the inferior joint capsule and intermediate signal  material effacing the normal subcoracoid fat as can be seen with  adhesive capsulitis.    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    Past Surgical History:  Procedure Laterality Date   APPENDECTOMY     BACK SURGERY     CESAREAN SECTION     COLONOSCOPY N/A 12/28/2023   Procedure: COLONOSCOPY;  Surgeon: Wilhelmenia Aloha Raddle., MD;  Location: [facility];  Service: Gastroenterology;  Laterality: N/A;   CORONARY  ANGIOPLASTY WITH STENT PLACEMENT     CORONARY STENT PLACEMENT     ESOPHAGOGASTRODUODENOSCOPY N/A 10/10/2023   Procedure: EGD (ESOPHAGOGASTRODUODENOSCOPY);  Surgeon: Wilhelmenia Aloha Raddle., MD;  Location: [facility];  Service: Gastroenterology;  Laterality: N/A;   EUS N/A 12/28/2023   Procedure: ULTRASOUND, UPPER GI TRACT, ENDOSCOPIC;  Surgeon: Wilhelmenia Aloha Raddle., MD;  Location: [facility];  Service: Gastroenterology;  Laterality: N/A;   insulin  pump     MULTIPLE TOOTH EXTRACTIONS     ROTATOR CUFF REPAIR      Social History   Socioeconomic History   Marital status: Married  Spouse name: Not on file   Number of children: 4   Years of education: Not on file   Highest education level: Not on file  Occupational History   Not on file  Tobacco Use   Smoking status: Former    Types: Cigarettes   Smokeless tobacco: Never  Vaping Use   Vaping status: Never Used  Substance and Sexual Activity   Alcohol use: Never   Drug use: Never   Sexual activity: Not on file  Other Topics Concern   Not on file  Social History Narrative   Not on file   Social Drivers of Health   Tobacco Use: Medium Risk (03/02/2024)   Patient History    Smoking Tobacco Use: Former    Smokeless Tobacco Use: Never    Passive Exposure: Not on file  Financial Resource Strain: Low Risk (01/30/2024)   Received from [facility]   Overall Financial Resource Strain (CARDIA)    How hard is it for you to pay for the very basics like food, housing, medical care, and heating?: Not hard at all  Food Insecurity: No Food Insecurity (02/17/2024)   Epic    Worried About Radiation Protection Practitioner of Food in the Last Year: Never true    Ran Out of Food in the Last Year: Never true  Transportation Needs: No Transportation Needs (02/17/2024)   Epic    Lack of Transportation (Medical): No    Lack of Transportation (Non-Medical): No  Physical Activity: Inactive (01/30/2024)   Received [facility]   Exercise Vital Sign     On average, how many days per week do you engage in moderate to strenuous exercise (like a brisk walk)?: Patient declined    On average, how many minutes do you engage in exercise at this level?: 0 min  Stress: Patient Declined (01/30/2024)   Received from [facility] - Occupational Stress Questionnaire    Do you feel stress - tense, restless, nervous, or anxious, or unable to sleep at night because your mind is troubled all the time - these days?: Patient declined  Social Connections: Unknown (07/02/2021)   [facility]   Social Network    Social Network: Not on file  Intimate Partner Violence: Not At Risk (02/17/2024)   Epic    Fear of Current or Ex-Partner: No    Emotionally Abused: No    Physically Abused: No    Sexually Abused: No  Depression (PHQ2-9): Low Risk (08/23/2022)   Depression (PHQ2-9)    PHQ-2 Score: 0  Recent Concern: Depression (PHQ2-9) - High Risk (07/19/2022)   Depression (PHQ2-9)    PHQ-2 Score: 17  Alcohol Screen: Not on file  Housing: Low Risk (02/17/2024)   Epic    Unable to Pay for Housing in the Last Year: No    Number of Times Moved in the Last Year: 0    Homeless in the Last Year: No  Utilities: Not At Risk (02/17/2024)   Epic    Threatened with loss of utilities: No  Health Literacy: Not on file    Family History  Problem Relation Age of Onset   Heart disease Mother    Heart disease Sister     ROS: no fevers or chills, productive cough, hemoptysis, dysphasia, odynophagia, melena, hematochezia, dysuria, hematuria, rash, seizure activity, orthopnea, PND, pedal edema, claudication. Remaining systems are negative.  Physical Exam: Well-developed well-nourished in no acute distress.  Skin is warm and dry.  HEENT is normal.  Neck is supple.  Chest is clear to auscultation with normal expansion.  Cardiovascular exam is regular rate and rhythm.  Abdominal exam nontender or distended. No masses  palpated. Extremities show no edema. neuro grossly intact  EKG Interpretation Date/Time:  Friday March 02 2024 16:00:45 EST Ventricular Rate:  78 PR Interval:  160 QRS Duration:  80 QT Interval:  386 QTC Calculation: 440 R Axis:   56  Text Interpretation: Normal sinus rhythm Normal ECG Confirmed by Pietro Rogue ([id number]) on 03/02/2024 4:01:44 PM    A/P  1 coronary artery disease-status post recent PCI of LAD.  Continue aspirin  and Plavix  (she was told previously that she would require lifelong Plavix ).  Continue statin.  She has had some left upper quadrant pain but no chest pain similar to her infarct pain and electrocardiogram shows no new ST changes.  2 hypertension-patient's blood pressure is controlled.  Continue present medical regimen.  3 hyperlipidemia-continue Lipitor.  Check lipids and liver.  4 peripheral vascular disease-continue aspirin  and statin.  Rogue Pietro, MD    "

## 2024-03-02 ENCOUNTER — Encounter: Payer: Self-pay | Admitting: Cardiology

## 2024-03-02 ENCOUNTER — Ambulatory Visit: Attending: Cardiology | Admitting: Cardiology

## 2024-03-02 VITALS — BP 124/56 | HR 80 | Ht 69.0 in | Wt 266.0 lb

## 2024-03-02 DIAGNOSIS — I10 Essential (primary) hypertension: Secondary | ICD-10-CM | POA: Diagnosis not present

## 2024-03-02 DIAGNOSIS — I2510 Atherosclerotic heart disease of native coronary artery without angina pectoris: Secondary | ICD-10-CM

## 2024-03-02 DIAGNOSIS — E7800 Pure hypercholesterolemia, unspecified: Secondary | ICD-10-CM

## 2024-03-02 DIAGNOSIS — R072 Precordial pain: Secondary | ICD-10-CM

## 2024-03-02 NOTE — Patient Instructions (Signed)
   Lab Work:  Your physician recommends that you return for lab work FASTING  If you have labs (blood work) drawn today and your tests are completely normal, you will receive your results only by: MyChart Message (if you have MyChart) OR A paper copy in the mail If you have any lab test that is abnormal or we need to change your treatment, we will call you to review the results.  Follow-Up: At [facility], you and your health needs are our priority.  As part of our continuing mission to provide you with exceptional heart care, our providers are all part of one team.  This team includes your primary Cardiologist (physician) and Advanced Practice Providers or APPs (Physician Assistants and Nurse Practitioners) who all work together to provide you with the care you need, when you need it.  Your next appointment:   6 month(s)  Provider:   Redell Shallow, MD

## 2024-03-03 ENCOUNTER — Ambulatory Visit: Payer: Self-pay | Admitting: Cardiology

## 2024-03-03 DIAGNOSIS — I509 Heart failure, unspecified: Secondary | ICD-10-CM

## 2024-03-03 DIAGNOSIS — E7800 Pure hypercholesterolemia, unspecified: Secondary | ICD-10-CM

## 2024-03-03 DIAGNOSIS — I2510 Atherosclerotic heart disease of native coronary artery without angina pectoris: Secondary | ICD-10-CM

## 2024-03-03 LAB — LIPID PANEL
Chol/HDL Ratio: 5.5 ratio — ABNORMAL HIGH (ref 0.0–4.4)
Cholesterol, Total: 154 mg/dL (ref 100–199)
HDL: 28 mg/dL — ABNORMAL LOW
LDL Chol Calc (NIH): 73 mg/dL (ref 0–99)
Triglycerides: 332 mg/dL — ABNORMAL HIGH (ref 0–149)
VLDL Cholesterol Cal: 53 mg/dL — ABNORMAL HIGH (ref 5–40)

## 2024-03-03 LAB — HEPATIC FUNCTION PANEL
ALT: 31 IU/L (ref 0–32)
AST: 24 IU/L (ref 0–40)
Albumin: 4.3 g/dL (ref 3.8–4.9)
Alkaline Phosphatase: 86 IU/L (ref 49–135)
Bilirubin Total: 0.3 mg/dL (ref 0.0–1.2)
Bilirubin, Direct: 0.16 mg/dL (ref 0.00–0.40)
Total Protein: 7.5 g/dL (ref 6.0–8.5)

## 2024-03-05 ENCOUNTER — Other Ambulatory Visit (HOSPITAL_BASED_OUTPATIENT_CLINIC_OR_DEPARTMENT_OTHER): Payer: Self-pay

## 2024-03-05 MED ORDER — EZETIMIBE 10 MG PO TABS
10.0000 mg | ORAL_TABLET | Freq: Every day | ORAL | 3 refills | Status: DC
  Filled 2024-03-05: qty 30, 30d supply, fill #0

## 2024-03-06 MED ORDER — EZETIMIBE 10 MG PO TABS: 10.0000 mg | ORAL_TABLET | Freq: Every day | ORAL | 3 refills | Status: AC

## 2024-03-07 ENCOUNTER — Other Ambulatory Visit (HOSPITAL_BASED_OUTPATIENT_CLINIC_OR_DEPARTMENT_OTHER): Payer: Self-pay

## 2024-03-08 MED ORDER — CLOPIDOGREL BISULFATE 75 MG PO TABS: 75.0000 mg | ORAL_TABLET | Freq: Every day | ORAL | 1 refills | Status: DC

## 2024-03-08 NOTE — Addendum Note (Signed)
 Addended by: RICHIE ADRIEN ORN on: 03/08/2024 08:12 AM   Modules accepted: Orders

## 2024-03-12 ENCOUNTER — Other Ambulatory Visit (HOSPITAL_BASED_OUTPATIENT_CLINIC_OR_DEPARTMENT_OTHER): Payer: Self-pay

## 2024-03-12 ENCOUNTER — Other Ambulatory Visit: Payer: Self-pay

## 2024-03-12 MED ORDER — CLOPIDOGREL BISULFATE 75 MG PO TABS
75.0000 mg | ORAL_TABLET | Freq: Every day | ORAL | 3 refills | Status: AC
  Filled 2024-03-12: qty 30, 30d supply, fill #0

## 2024-03-12 MED ORDER — ISOSORBIDE MONONITRATE ER 30 MG PO TB24
60.0000 mg | ORAL_TABLET | Freq: Every day | ORAL | 3 refills | Status: AC
  Filled 2024-03-12: qty 60, 30d supply, fill #0
  Filled 2024-03-20: qty 180, 90d supply, fill #0

## 2024-03-12 NOTE — Addendum Note (Signed)
 Addended by: Lacharles Altschuler W on: 03/12/2024 07:58 AM   Modules accepted: Orders

## 2024-03-20 ENCOUNTER — Other Ambulatory Visit (HOSPITAL_COMMUNITY): Payer: Self-pay

## 2024-03-20 ENCOUNTER — Other Ambulatory Visit (HOSPITAL_BASED_OUTPATIENT_CLINIC_OR_DEPARTMENT_OTHER): Payer: Self-pay

## 2024-04-03 ENCOUNTER — Other Ambulatory Visit: Payer: Self-pay

## 2024-04-03 ENCOUNTER — Ambulatory Visit
Admission: EM | Admit: 2024-04-03 | Discharge: 2024-04-03 | Disposition: A | Discharge status: Home / Self Care | Source: Home / Self Care

## 2024-04-03 ENCOUNTER — Telehealth: Payer: Self-pay

## 2024-04-03 DIAGNOSIS — R1084 Generalized abdominal pain: Secondary | ICD-10-CM | POA: Diagnosis not present

## 2024-04-03 DIAGNOSIS — R739 Hyperglycemia, unspecified: Secondary | ICD-10-CM

## 2024-04-03 HISTORY — DX: Heart failure, unspecified: I50.9

## 2024-04-03 HISTORY — DX: Insomnia, unspecified: G47.00

## 2024-04-03 HISTORY — DX: Abnormal levels of other serum enzymes: R74.8

## 2024-04-03 LAB — POCT FASTING CBG KUC MANUAL ENTRY: POCT Glucose (KUC): 426 mg/dL — AB (ref 70–99)

## 2024-04-03 NOTE — ED Provider Notes (Signed)
 " Jean Calderon    CSN: [id number] Arrival date & time: 04/03/24  1653      History   Chief Complaint Chief Complaint  Patient presents with   Diarrhea   Abdominal Pain    HPI Jean Calderon is a 54 y.o. female.  Here with abdominal pain that started yesterday. Generalized pain and associated nausea and diarrhea. No vomiting.  Rating pain 10/10 currently.  Denies fever or chills  States blood sugar was 280 this morning.   History of abdominal pain.  Uncontrolled diabetes, A1c last month 12.5. 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 Severe tendinosis of the supraspinatus tendon.  2. Mild tendinosis of the infraspinatus tendon.  3. Thickening of the inferior joint capsule and intermediate signal  material effacing the normal subcoracoid fat as can be seen with  adhesive capsulitis.    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    Patient Active Problem List   Diagnosis Date Noted   Chest pain 02/16/2024   Colon cancer screening 12/28/2023   History of colonic polyps 12/28/2023   Gastritis without bleeding 12/28/2023   Mucosal abnormality of esophagus 10/10/2023   Dysphagia 10/10/2023   Chronic pancreatitis (HCC) 05/13/2023   Elevated LFTs 05/13/2023   Common bile duct dilation 05/13/2023   Chronic midline low back pain 07/19/2022   History of lumbar fusion 07/19/2022    Chronic left SI joint pain 07/19/2022   Numbness and tingling of left leg 07/19/2022   Chronic pain syndrome 07/19/2022   Uncontrolled type 1 diabetes mellitus with hyperglycemia, with long-term current use of insulin  (HCC) 06/23/2022   CKD stage G3b/A3, GFR 30-44 and albumin creatinine ratio >300 mg/g (HCC) 06/06/2022   Transaminitis 06/06/2022   Acute pancreatitis 06/06/2022   Ulnar neuropathy of both upper extremities 12/23/2021   Hyperlipidemia 10/01/2021   Diabetic neuropathy (HCC) 09/30/2021   Meralgia paresthetica, right lower limb 09/30/2021   Gait instability 07/30/2021   Migraine without status migrainosus, not intractable 07/30/2021   Poorly controlled type 2 diabetes mellitus with peripheral neuropathy (HCC) 06/05/2021   Menopausal symptoms 04/15/2021   Anxiety 03/25/2021   CAD (coronary artery disease)    HTN (hypertension)    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   Heart valve disease 01/06/2021   Vitamin B12 deficiency 12/03/2020   Vitamin D deficiency 12/03/2020   Chronic left shoulder pain 11/25/2020   Generalized anxiety disorder 11/25/2020   Psoriasis 01/01/2020   Lumbar radiculopathy 06/21/2019   Uterine leiomyoma 03/06/2019   Depression with anxiety 02/02/2018   Primary insomnia 02/02/2018   Class 2 severe obesity due to excess calories with serious comorbidity and body mass index (BMI) of 38.0 to 38.9 in adult 10/12/2017   Insulin  pump in place 10/12/2017   Cervical radiculopathy 08/15/2017   Family history of early CAD 07/21/2017   Obstructive sleep apnea syndrome 07/21/2017  Chronic right shoulder pain 05/30/2017   Type 2 diabetes mellitus with mild nonproliferative diabetic retinopathy without macular edema, bilateral (HCC) 03/18/2017   Congestive heart failure (HCC) 03/16/2017   Other cervical disc degeneration, unspecified cervical region 11/11/2016   Adhesive capsulitis of right shoulder 10/08/2016   GERD (gastroesophageal reflux disease)  07/19/2016   Spinal stenosis of cervical region 12/01/2015   Cervical disc herniation 12/01/2015   Bilateral carpal tunnel syndrome 11/04/2015   Lesion of ulnar nerve, left upper limb 11/04/2015   Stented coronary artery 07/07/2015    Past Surgical History:  Procedure Laterality Date   APPENDECTOMY     BACK SURGERY     CESAREAN SECTION     COLONOSCOPY N/A 12/28/2023   Procedure: COLONOSCOPY;  Surgeon: Wilhelmenia Aloha Raddle., MD;  Location: [facility];  Service: Gastroenterology;  Laterality: N/A;   CORONARY ANGIOPLASTY WITH STENT PLACEMENT     CORONARY STENT PLACEMENT     ESOPHAGOGASTRODUODENOSCOPY N/A 10/10/2023   Procedure: EGD (ESOPHAGOGASTRODUODENOSCOPY);  Surgeon: Wilhelmenia Aloha Raddle., MD;  Location: [facility];  Service: Gastroenterology;  Laterality: N/A;   EUS N/A 12/28/2023   Procedure: ULTRASOUND, UPPER GI TRACT, ENDOSCOPIC;  Surgeon: Wilhelmenia Aloha Raddle., MD;  Location: [facility];  Service: Gastroenterology;  Laterality: N/A;   insulin  pump     MULTIPLE TOOTH EXTRACTIONS     ROTATOR CUFF REPAIR      OB History   No obstetric history on file.      Home Medications    Prior to Admission medications  Medication Sig Start Date End Date Taking? Authorizing Provider  acetaminophen  (TYLENOL ) 500 MG tablet Take 1 tablet (500 mg total) by mouth every 6 (six) hours as needed. 10/20/23   Ball, [place]  G, FNP  albuterol  (VENTOLIN  HFA) 108 (90 Base) MCG/ACT inhaler Inhale 2 puffs into the lungs every 4 (four) hours as needed. 11/21/23   [provider]  aluminum -magnesium  hydroxide 200-200 MG/5ML suspension Take 10 mLs by mouth every 6 (six) hours as needed for indigestion. 02/18/24   Darci Pore, MD  amitriptyline  (ELAVIL ) 25 MG tablet Take 1 tablet (25 mg total) by mouth at bedtime. Take an hour before bed for nerve pain 11/30/23   Maranda Jamee Jacob, MD  aspirin  EC 81 MG tablet Take 81 mg by mouth daily.    [provider]  atorvastatin   (LIPITOR) 40 MG tablet TAKE 1 TABLET(40 MG) BY MOUTH DAILY 04/01/23   Colette Torrence GRADE, MD  carvedilol (COREG) 25 MG tablet Take 25 mg by mouth daily. 01/31/24 03/02/24  [provider]  clopidogrel  (PLAVIX ) 75 MG tablet Take 1 tablet (75 mg total) by mouth daily. 03/12/24   Pietro Redell RAMAN, MD  Continuous Blood Gluc Receiver (DEXCOM G7 RECEIVER) DEVI Check sugars daily Dx E11.9 04/05/22   Colette Torrence GRADE, MD  Continuous Blood Gluc Sensor (DEXCOM G7 SENSOR) MISC Check sugars daily Dx E11.9 04/05/22   Colette Torrence GRADE, MD  Continuous Glucose Transmitter (DEXCOM G6 TRANSMITTER) MISC  07/01/22   [provider]  estradiol  (VIVELLE -DOT) 0.1 MG/24HR patch Place 1 patch onto the skin 2 (two) times a week.    [provider]  Estradiol -Norethindrone  Acet 0.5-0.1 MG tablet Take 1 tablet by mouth daily. 12/06/22   Colette Torrence GRADE, MD  ezetimibe  (ZETIA ) 10 MG tablet Take 1 tablet (10 mg total) by mouth daily. 03/06/24   Pietro Redell RAMAN, MD  fluticasone  (FLONASE ) 50 MCG/ACT nasal spray Place 1 spray into both nostrils daily as needed. 06/26/23  [provider]  HUMALOG  100 UNIT/ML injection INJECT 25 UNITS INTO THE SKIN 3 TIMES DAILY BEFORE MEALS. USE INSULIN  PUMP 05/12/22   Colette Torrence GRADE, MD  hydrOXYzine  (ATARAX ) 25 MG tablet Take 25 mg by mouth 3 (three) times daily.    [provider]  Insulin  Aspart, w/Niacinamide, (FIASP ) 100 UNIT/ML SOLN To be used with insulin  pump. 07/08/22   [provider]  Insulin  Disposable Pump (OMNIPOD 5 G6 PODS, GEN 5,) MISC CHANGE POD EVERY OTHER DAY. 06/07/22   Colette Torrence GRADE, MD  isosorbide  mononitrate (IMDUR ) 30 MG 24 hr tablet Take 2 tablets (60 mg total) by mouth daily. 03/12/24   Pietro Redell RAMAN, MD  JARDIANCE  25 MG TABS tablet Take 1 tablet (25 mg total) by mouth daily. 06/15/22   Colette Torrence GRADE, MD  ketoconazole  (NIZORAL ) 2 % shampoo Apply to affected areas 3 x a week for 8 weeks 11/18/22   Colette Torrence GRADE, MD   lisinopril -hydrochlorothiazide  (ZESTORETIC ) 20-12.5 MG tablet Take 1 tablet by mouth daily. 10/27/23   [provider]  mometasone  (NASONEX ) 50 MCG/ACT nasal spray One spray in each nostril twice a day, use left hand for right nostril, and right hand for left nostril.  Please dispense one bottle. 05/24/22   Rucker, Torrence GRADE, MD  montelukast  (SINGULAIR ) 10 MG tablet TAKE 1 TABLET(10 MG) BY MOUTH AT BEDTIME 04/25/23   Rucker, Torrence GRADE, MD  nitroGLYCERIN  (NITROSTAT ) 0.4 MG SL tablet Place 1 tablet (0.4 mg total) under the tongue every 5 (five) minutes as needed for chest pain. 11/12/22   Pietro Redell RAMAN, MD  nystatin  ointment (MYCOSTATIN ) Apply 1 Application topically 2 (two) times daily. 07/22/22   Colette Torrence GRADE, MD  olopatadine  (PATADAY ) 0.1 % ophthalmic solution Place 1 drop into both eyes 2 (two) times daily. 05/24/22   Colette Torrence GRADE, MD  oxyCODONE -acetaminophen  (PERCOCET/ROXICET) 5-325 MG tablet Take 1 tablet by mouth every 8 (eight) hours as needed for severe pain (pain score 7-10). 02/07/24   Ragan, Michael, FNP  pantoprazole  (PROTONIX ) 40 MG tablet Take 1 tablet (40 mg total) by mouth 2 (two) times daily. 01/12/24   Kennedy-Smith, Colleen M, NP  pregabalin  (LYRICA ) 200 MG capsule TAKE 1 CAPSULE BY MOUTH TWICE DAILY Patient taking differently: Take 400 mg by mouth at bedtime. 11/09/22   Colette Torrence GRADE, MD  progesterone (PROMETRIUM) 200 MG capsule Take 200 mg by mouth at bedtime.    [provider]  sertraline  (ZOLOFT ) 100 MG tablet Take 100 mg by mouth daily. 10/27/23   [provider]  SKYRIZI PEN 150 MG/ML pen Inject 150 mg into the skin every 6 (six) months. 12/04/23   [provider]  solifenacin (VESICARE) 10 MG tablet Take 10 mg by mouth daily.    [provider]  sucralfate  (CARAFATE ) 1 g tablet TAKE 1 TABLET(1 GRAM) BY MOUTH FOUR TIMES DAILY AT BEDTIME WITH MEALS 05/09/23   Rucker, Torrence GRADE, MD  topiramate  (TOPAMAX ) 100 MG tablet Take by  mouth 2 (two) times daily. 08/17/21   [provider]  traZODone  (DESYREL ) 100 MG tablet TAKE 1 TABLET(100 MG) BY MOUTH AT BEDTIME AS NEEDED FOR SLEEP 02/14/23   Colette Torrence GRADE, MD    Family History Family History  Problem Relation Age of Onset   Heart disease Mother    Heart disease Sister     Social History Social History[1]   Allergies   Tramadol, Nsaids, Sulfamethoxazole-trimethoprim, and Ibuprofen   Review of Systems Review of Systems  As per HPI  Physical Exam Triage Vital Signs ED Triage Vitals  Encounter Vitals Group     BP      Girls Systolic BP Percentile      Girls Diastolic BP Percentile      Boys Systolic BP Percentile      Boys Diastolic BP Percentile      Pulse      Resp      Temp      Temp src      SpO2      Weight      Height      Head Circumference      Peak Flow      Pain Score      Pain Loc      Pain Education      Exclude from Growth Chart    No data found.  Updated Vital Signs BP 129/84   Pulse 87   Temp 97.7 F (36.5 C)   Resp 18   SpO2 95%   Physical Exam Vitals and nursing note reviewed.  Constitutional:      Appearance: Normal appearance.  HENT:     Mouth/Throat:     Mouth: Mucous membranes are moist.     Pharynx: Oropharynx is clear.  Eyes:     Conjunctiva/sclera: Conjunctivae normal.  Cardiovascular:     Rate and Rhythm: Normal rate and regular rhythm.     Heart sounds: Normal heart sounds.  Pulmonary:     Effort: Pulmonary effort is normal. No respiratory distress.     Breath sounds: Normal breath sounds.  Abdominal:     General: Bowel sounds are normal.     Palpations: Abdomen is soft.     Tenderness: There is abdominal tenderness. There is no right CVA tenderness, left CVA tenderness, guarding or rebound.     Comments: Habitus limits exam. Generalized tenderness throughout, grimace with palpation of LUQ, LLQ. No guarding or rebound   Musculoskeletal:        General: Normal range of motion.   Skin:    General: Skin is warm and dry.  Neurological:     Mental Status: She is alert and oriented to person, place, and time.     UC Treatments / Results  Labs (all labs ordered are listed, but only abnormal results are displayed) Labs Reviewed  POCT FASTING CBG [facility] MANUAL ENTRY - Abnormal; Notable for the following components:      Result Value   POCT Glucose ([facility]) 426 (*)    All other components within normal limits    EKG  Radiology No results found.  Procedures Procedures (including critical Calderon time)  Medications Ordered in UC Medications - No data to display  Initial Impression / Assessment and Plan / UC Course  I have reviewed the triage vital signs and the nursing notes.  Pertinent labs & imaging results that were available during my Calderon of the patient were reviewed by me and considered in my medical decision making (see chart for details).  Afebrile, stable vitals.  CBG 426 With patient extensive history, rating abdominal pain 10/10, and pain with her exam,  I have advised evaluation in the emergency department. Requires higher level of Calderon, advanced imaging and blood work not available in the urgent Calderon setting. Her husband will transport her to the nearest ED now.   Final Clinical Impressions(s) / UC Diagnoses   Final diagnoses:  Generalized abdominal pain  Hyperglycemia   Discharge Instructions   None  ED Prescriptions   None    PDMP not reviewed this encounter.     [1]  Social History Tobacco Use   Smoking status: Former    Types: Cigarettes   Smokeless tobacco: Never  Vaping Use   Vaping status: Never Used  Substance Use Topics   Alcohol use: Never   Drug use: Never     Jeryl Stabs, PA-C 04/03/24 1828  "

## 2024-04-03 NOTE — Telephone Encounter (Signed)
-----   Message from Elida Shawl, NP sent at 04/03/2024  3:53 PM EST ----- Alan, thank you for the follow up. LFTs were normal 03/02/2024. You can cancel my prior hepatic panel order since these labs were ordered by Dr. Pietro and LFTs normal. THX. ----- Message ----- From: Marcello Alan CROME, CMA Sent: 04/03/2024  10:42 AM EST To: Elida CHRISTELLA Shawl, NP  Please see patient's last hepatic panel in January. You wanted patient to have repeat LFTs in 2 months from the 02/05/25 phone note. It was ordered by Dr. Pietro. ----- Message ----- From: Marcello Alan CROME, CMA Sent: 04/02/2024  12:00 AM EST To: Alan CROME Marcello, CMA  See phone note from 02/06/24. Patient needs repeat hepatic panel.

## 2024-04-03 NOTE — ED Notes (Signed)
 Patient is being discharged from the Urgent Care and sent to the Emergency Department via pov . Per rising pa-c, patient is in need of higher level of care due to hyperglycemia. Patient is aware and verbalizes understanding of plan of care.  Vitals:   04/03/24 1657  BP: 129/84  Pulse: 87  Resp: 18  Temp: 97.7 F (36.5 C)  SpO2: 95%

## 2024-04-03 NOTE — ED Notes (Signed)
 NP aware cbg 426

## 2024-04-03 NOTE — ED Triage Notes (Addendum)
 Abd pain since yesterday and feeling nauseated. Has had diarrhea. Not feeling well. Reports abd pain is diffuse. No fever. No otc meds. Head hurting/sinus drainage x 4 days later states.
